# Patient Record
Sex: Female | Born: 1985 | Race: Black or African American | Hispanic: No | Marital: Single | Smoking: Current some day smoker
Health system: Southern US, Community
[De-identification: ages and names within clinical notes are randomized; demographics above are authoritative.]

## PROBLEM LIST (undated history)

## (undated) ENCOUNTER — Inpatient Hospital Stay (HOSPITAL_COMMUNITY): Payer: Self-pay

## (undated) ENCOUNTER — Inpatient Hospital Stay (HOSPITAL_COMMUNITY): Admission: RE | Payer: Medicaid Other | Source: Ambulatory Visit

## (undated) DIAGNOSIS — K819 Cholecystitis, unspecified: Secondary | ICD-10-CM

## (undated) DIAGNOSIS — F419 Anxiety disorder, unspecified: Secondary | ICD-10-CM

## (undated) DIAGNOSIS — D649 Anemia, unspecified: Secondary | ICD-10-CM

## (undated) DIAGNOSIS — Z789 Other specified health status: Secondary | ICD-10-CM

---
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
## 2005-09-14 ENCOUNTER — Inpatient Hospital Stay (HOSPITAL_COMMUNITY): Admission: AD | Admit: 2005-09-14 | Discharge: 2005-09-14 | Payer: Self-pay | Admitting: Family Medicine

## 2006-02-15 ENCOUNTER — Emergency Department (HOSPITAL_COMMUNITY): Admission: EM | Admit: 2006-02-15 | Discharge: 2006-02-16 | Payer: Self-pay | Admitting: Emergency Medicine

## 2006-02-18 ENCOUNTER — Inpatient Hospital Stay (HOSPITAL_COMMUNITY): Admission: AD | Admit: 2006-02-18 | Discharge: 2006-02-18 | Payer: Self-pay | Admitting: Obstetrics & Gynecology

## 2006-03-14 ENCOUNTER — Inpatient Hospital Stay (HOSPITAL_COMMUNITY): Admission: AD | Admit: 2006-03-14 | Discharge: 2006-03-14 | Payer: Self-pay | Admitting: Obstetrics and Gynecology

## 2006-03-28 ENCOUNTER — Inpatient Hospital Stay (HOSPITAL_COMMUNITY): Admission: AD | Admit: 2006-03-28 | Discharge: 2006-03-28 | Payer: Self-pay | Admitting: Obstetrics & Gynecology

## 2006-06-26 ENCOUNTER — Inpatient Hospital Stay (HOSPITAL_COMMUNITY): Admission: AD | Admit: 2006-06-26 | Discharge: 2006-06-26 | Payer: Self-pay | Admitting: Obstetrics

## 2006-07-30 ENCOUNTER — Inpatient Hospital Stay (HOSPITAL_COMMUNITY): Admission: AD | Admit: 2006-07-30 | Discharge: 2006-07-30 | Payer: Self-pay | Admitting: Obstetrics

## 2006-09-17 ENCOUNTER — Inpatient Hospital Stay (HOSPITAL_COMMUNITY): Admission: AD | Admit: 2006-09-17 | Discharge: 2006-09-18 | Payer: Self-pay | Admitting: Family Medicine

## 2006-10-03 ENCOUNTER — Inpatient Hospital Stay (HOSPITAL_COMMUNITY): Admission: AD | Admit: 2006-10-03 | Discharge: 2006-10-05 | Payer: Self-pay | Admitting: Obstetrics

## 2007-01-04 ENCOUNTER — Inpatient Hospital Stay (HOSPITAL_COMMUNITY): Admission: AD | Admit: 2007-01-04 | Discharge: 2007-01-04 | Payer: Self-pay | Admitting: Obstetrics

## 2007-02-04 ENCOUNTER — Inpatient Hospital Stay (HOSPITAL_COMMUNITY): Admission: AD | Admit: 2007-02-04 | Discharge: 2007-02-04 | Payer: Self-pay | Admitting: Obstetrics

## 2007-04-30 ENCOUNTER — Ambulatory Visit (HOSPITAL_COMMUNITY): Admission: RE | Admit: 2007-04-30 | Discharge: 2007-04-30 | Payer: Self-pay | Admitting: Obstetrics

## 2007-05-07 ENCOUNTER — Ambulatory Visit (HOSPITAL_COMMUNITY): Admission: RE | Admit: 2007-05-07 | Discharge: 2007-05-07 | Payer: Self-pay | Admitting: Obstetrics

## 2007-07-12 ENCOUNTER — Encounter (INDEPENDENT_AMBULATORY_CARE_PROVIDER_SITE_OTHER): Payer: Self-pay | Admitting: Obstetrics

## 2007-07-12 ENCOUNTER — Inpatient Hospital Stay (HOSPITAL_COMMUNITY): Admission: AD | Admit: 2007-07-12 | Discharge: 2007-07-15 | Payer: Self-pay | Admitting: Obstetrics

## 2008-03-10 ENCOUNTER — Inpatient Hospital Stay (HOSPITAL_COMMUNITY): Admission: AD | Admit: 2008-03-10 | Discharge: 2008-03-10 | Payer: Self-pay | Admitting: Obstetrics

## 2008-04-10 ENCOUNTER — Inpatient Hospital Stay (HOSPITAL_COMMUNITY): Admission: AD | Admit: 2008-04-10 | Discharge: 2008-04-10 | Payer: Self-pay | Admitting: Gynecology

## 2008-05-26 ENCOUNTER — Inpatient Hospital Stay (HOSPITAL_COMMUNITY): Admission: AD | Admit: 2008-05-26 | Discharge: 2008-05-26 | Payer: Self-pay | Admitting: Obstetrics & Gynecology

## 2008-07-01 ENCOUNTER — Inpatient Hospital Stay (HOSPITAL_COMMUNITY): Admission: AD | Admit: 2008-07-01 | Discharge: 2008-07-03 | Payer: Self-pay | Admitting: Obstetrics

## 2008-09-13 ENCOUNTER — Inpatient Hospital Stay (HOSPITAL_COMMUNITY): Admission: AD | Admit: 2008-09-13 | Discharge: 2008-09-13 | Payer: Self-pay | Admitting: Obstetrics

## 2008-09-15 ENCOUNTER — Inpatient Hospital Stay (HOSPITAL_COMMUNITY): Admission: AD | Admit: 2008-09-15 | Discharge: 2008-09-15 | Payer: Self-pay | Admitting: Obstetrics

## 2008-09-18 ENCOUNTER — Inpatient Hospital Stay (HOSPITAL_COMMUNITY): Admission: AD | Admit: 2008-09-18 | Discharge: 2008-09-22 | Payer: Self-pay | Admitting: Obstetrics

## 2008-09-19 ENCOUNTER — Encounter (INDEPENDENT_AMBULATORY_CARE_PROVIDER_SITE_OTHER): Payer: Self-pay | Admitting: Obstetrics

## 2008-10-08 ENCOUNTER — Emergency Department (HOSPITAL_COMMUNITY): Admission: EM | Admit: 2008-10-08 | Discharge: 2008-10-08 | Payer: Self-pay | Admitting: Emergency Medicine

## 2010-05-27 ENCOUNTER — Emergency Department (HOSPITAL_COMMUNITY): Admission: EM | Admit: 2010-05-27 | Discharge: 2010-05-27 | Payer: Self-pay | Admitting: Emergency Medicine

## 2010-07-18 ENCOUNTER — Emergency Department (HOSPITAL_COMMUNITY): Admission: EM | Admit: 2010-07-18 | Discharge: 2010-07-18 | Payer: Self-pay | Admitting: Emergency Medicine

## 2010-10-07 ENCOUNTER — Encounter: Payer: Self-pay | Admitting: Obstetrics and Gynecology

## 2010-10-08 ENCOUNTER — Encounter: Payer: Self-pay | Admitting: Obstetrics

## 2010-10-26 ENCOUNTER — Emergency Department (HOSPITAL_COMMUNITY)
Admission: EM | Admit: 2010-10-26 | Discharge: 2010-10-27 | Disposition: A | Discharge status: Home / Self Care | Payer: Medicaid Other | Attending: Emergency Medicine | Admitting: Emergency Medicine

## 2010-10-26 DIAGNOSIS — K029 Dental caries, unspecified: Secondary | ICD-10-CM | POA: Insufficient documentation

## 2010-10-26 DIAGNOSIS — K089 Disorder of teeth and supporting structures, unspecified: Secondary | ICD-10-CM | POA: Insufficient documentation

## 2010-11-28 LAB — POCT PREGNANCY, URINE: Preg Test, Ur: NEGATIVE

## 2010-11-30 LAB — URINE MICROSCOPIC-ADD ON

## 2010-11-30 LAB — GLUCOSE, CAPILLARY: Glucose-Capillary: 82 mg/dL (ref 70–99)

## 2010-11-30 LAB — URINALYSIS, ROUTINE W REFLEX MICROSCOPIC
Bilirubin Urine: NEGATIVE
Glucose, UA: NEGATIVE mg/dL
Ketones, ur: NEGATIVE mg/dL
Leukocytes, UA: NEGATIVE
Nitrite: NEGATIVE
Protein, ur: NEGATIVE mg/dL
Specific Gravity, Urine: 1.026 (ref 1.005–1.030)
Urobilinogen, UA: 0.2 mg/dL (ref 0.0–1.0)
pH: 6 (ref 5.0–8.0)

## 2010-11-30 LAB — WET PREP, GENITAL
Trich, Wet Prep: NONE SEEN
Yeast Wet Prep HPF POC: NONE SEEN

## 2010-11-30 LAB — POCT PREGNANCY, URINE: Preg Test, Ur: NEGATIVE

## 2010-11-30 LAB — RPR: RPR Ser Ql: NONREACTIVE

## 2010-11-30 LAB — GC/CHLAMYDIA PROBE AMP, GENITAL
Chlamydia, DNA Probe: NEGATIVE
GC Probe Amp, Genital: NEGATIVE

## 2011-01-01 LAB — CBC
HCT: 28.6 % — ABNORMAL LOW (ref 36.0–46.0)
Hemoglobin: 7.4 g/dL — CL (ref 12.0–15.0)
Hemoglobin: 9.6 g/dL — ABNORMAL LOW (ref 12.0–15.0)
MCHC: 33.6 g/dL (ref 30.0–36.0)
MCHC: 33.7 g/dL (ref 30.0–36.0)
MCV: 95.1 fL (ref 78.0–100.0)
MCV: 96 fL (ref 78.0–100.0)
Platelets: 241 10*3/uL (ref 150–400)
RBC: 2.29 MIL/uL — ABNORMAL LOW (ref 3.87–5.11)
RBC: 3.01 MIL/uL — ABNORMAL LOW (ref 3.87–5.11)
RDW: 13.7 % (ref 11.5–15.5)
WBC: 11 10*3/uL — ABNORMAL HIGH (ref 4.0–10.5)
WBC: 9.9 10*3/uL (ref 4.0–10.5)

## 2011-01-30 NOTE — Op Note (Signed)
NAMELILLIANE, Lauren Pineda               ACCOUNT NO.:  [id number]   MEDICAL RECORD NO.:  [id number]          PATIENT TYPE:  INP   LOCATION:  9320                          [facility]ILITY:  [facility]   PHYSICIAN:  Kathreen Cosier, M.D.DATE OF BIRTH:  1985-11-07   DATE OF PROCEDURE:  07/12/2007  DATE OF DISCHARGE:                               OPERATIVE REPORT   PREOPERATIVE DIAGNOSES:  Intrauterine pregnancy at 33 weeks in labor, 6  cm dilated, with a breech presentation.   SURGEON:  Kathreen Cosier, M.D.   ANESTHESIA:  Spinal.   PROCEDURE:  The patient placed on the operating table in a supine  position after spinal administered.  Abdomen prepped and draped.  Bladder emptied with a Foley catheter.  A transverse suprapubic incision  made, carried down to the fascia, fascia cleaned and incised the length  of the incision.  Recti muscles retracted laterally.  Peritoneum incised  longitudinally.  A transverse incision made in the visceral peritoneum  above the bladder.  Bladder mobilized inferiorly.  Transverse lower  uterine incision made.  The patient delivered from a double footling  breech.  Fluid was clear.  A female, Apgar 8/8, weighing 3 pounds 15  ounces.  The team was in attendance.  Placenta was posterior, removed  manually and sent to pathology.  Uterine cavity cleaned with dry laps.  Uterine incision closed in one layer with continuous suture of #1  chromic.  Hemostasis satisfactory.  Bladder flap reattached with 2-0  chromic.  Uterus well-contracted.  Tubes and ovaries normal.  Abdomen  closed in layers, peritoneum with continuous suture of 0 chromic, fascia  with continuous suture of 0 Dexon, and the skin closed with subcuticular  stitch of 4-0 Monocryl.  Blood loss 500 mL.           ______________________________  Kathreen Cosier, M.D.     BAM/MEDQ  D:  07/12/2007  T:  07/14/2007  Job:  [id number]

## 2011-01-30 NOTE — H&P (Signed)
NAMERAIMI, GUILLERMO NO.:  [id number]   MEDICAL RECORD NO.:  [id number]          PATIENT TYPE:  INP   LOCATION:  9137                          [facility]ILITY:  [facility]   PHYSICIAN:  Kathreen Cosier, M.D.DATE OF BIRTH:  [birth year]   DATE OF ADMISSION:  09/18/2008  DATE OF DISCHARGE:                              HISTORY & PHYSICAL   The patient is a 25 year old gravida 5, para 1-1-2-2, [facility] September 26, 2008.  She had a previous C-section and previous vaginal delivery and  she was followed with this pregnancy and due prenatal because of IUGR.  She had nonstress test twice weekly and ultrasounds every 3 weeks, at  which point the growth had stopped and perinatologist recommended  delivery.  The patient was brought in for induction of labor.  Cervix  was 2 cm, 70%, and the vertex -3 station.  She had a positive GBS and  the membranes were ruptured, IUPC inserted, and started a low dose of  Pitocin.  Shortly after she started contraction, she had variables with  these contraction.  Then eventually variables became prolonged and it  was decided that she will be delivered by C-section because of  nonreassuring fetal heart rate racing.  An amnioinfusion had been done,  but there was no improvement.   PHYSICAL EXAMINATION:  GENERAL:  A well-developed female in labor.  HEENT:  Negative.  LUNGS:  Clear.  HEART:  Regular rhythm.  No murmurs or gallops.  BREASTS:  No masses.  ABDOMEN:  36 weeks size.  PELVIC:  As described above.  EXTREMITIES:  Negative.           ______________________________  Kathreen Cosier, M.D.     BAM/MEDQ  D:  09/19/2008  T:  09/19/2008  Job:  [id number]

## 2011-02-02 NOTE — Discharge Summary (Signed)
Lauren Pineda, Lauren Pineda               ACCOUNT NO.:  [id number]   MEDICAL RECORD NO.:  [id number]          PATIENT TYPE:  INP   LOCATION:  9320                          [facility]   PHYSICIAN:  Kathreen Cosier, M.D.DATE OF BIRTH:  March 03, 1986   DATE OF ADMISSION:  07/12/2007  DATE OF DISCHARGE:  07/15/2007                               DISCHARGE SUMMARY   The patient is to 25 year old, gravida 4, para 1-0-2-1, [facility] August 30, 2007, [redacted] weeks pregnant who came in labor.  Cervix 6 cm dilated with a  breech presentation. She underwent primary low transverse cesarean  section. She had a female, Apgars 8 and 8, weighing 3 pounds 15 ounces,  a double footling breech. Placenta was sent to pathology.   Postoperatively, she did well.  On admission, her hemoglobin was 10.9,  platelet 253; postop 9.8 and 182, respectively.  Hepatitis negative, HIV  negative.  Urine negative, RPR negative. The patient was discharged on  the third postoperative day ambulatory, on a regular diet, and Tylox one  every 3-4 hours for pain and ferrous sulfate 325 p.o. daily.   DISCHARGE DIAGNOSIS:  Status post primary low transverse cesarean  section at 33 weeks for breech presentation in labor.           ______________________________  Kathreen Cosier, M.D.     BAM/MEDQ  D:  08/06/2007  T:  08/06/2007  Job:  [id number]

## 2011-02-02 NOTE — H&P (Signed)
NAME:  Lauren Pineda, Lauren Pineda NO.:  [id number]   MEDICAL RECORD NO.:  [id number]          PATIENT TYPE:  MAT   LOCATION:  MATC                          [facility]:  [facility]   PHYSICIAN:  Juluis Mire, M.D.   DATE OF BIRTH:  1986/05/07   DATE OF ADMISSION:  07/23/2004  DATE OF DISCHARGE:                                HISTORY & PHYSICAL   The patient is an 25 year old, gravida 2, para 0, abortus 1, black female  with estimated gestational age of 10+ weeks.  She has been followed with  first-trimester bleeding.  She had an ultrasound on June 17, 2004 that  revealed an intrauterine gestational sac of 5 weeks.  There was no fetal  pulses.  She did not seek any further followup.  She came in on July 22, 2004 which was yesterday.  An ultrasound revealed a gestational sac at that  point in time with no fetal pole consistent with a non-viable first-  trimester pregnancy.  She presents now to undergo dilatation and evacuation  for non-viable first-trimester pregnancy.   ALLERGIES:  No known drug allergies.   MEDICATIONS:  None.   PAST HISTORY:  She had a Cytotec-aborted miscarriage of 8 weeks.  She has  had no previous surgical history, and the obstetrical history is the noted  miscarriage.  She does have a history of STDs in the past.   FAMILY HISTORY:  Noncontributory.   SOCIAL HISTORY:  Denies alcohol, drug or tobacco use.   REVIEW OF SYSTEMS:  Noncontributory.   PHYSICAL EXAMINATION:  VITAL SIGNS: Patient afebrile with stable vital  signs.  LUNGS: Clear.  CARDIAC SYSTEM: Regular rhythm and rate without murmurs or gallops.  ABDOMEN: Soft, nontender, no mass, organomegaly or tenderness.  PELVIC: Normal external genitalia.  Vaginal mucosa clear.  The cervix is  unremarkable.  There is a minimal amount of bleeding noted.  Uterus  approximately 6-8 weeks in size.  Adnexa unremarkable.  EXTREMITIES: Trace edema.  NEUROLOGIC: Neurologic exam is gross limits.   IMPRESSION:  Nonviable first trimester pregnancy.   PLAN:  The patient will undergo a dilation and evacuation.  The risks of  surgery have been discussed including the risk of infection, the risk of  hemorrhage that could require transfusion, the risk of AIDS or hepatitis,  the risk of  continued bleeding that could require repeat D&E, risk of uterine  perforation that could lead to injury to internal organs requiring  exploratory surgery, the risk of deep venous thrombosis and pulmonary  embolus.  Blood type is O positive.  RhoGAM will not be required.      JSM/MEDQ  D:  07/23/2004  T:  07/23/2004  Job:  [id number]

## 2011-02-02 NOTE — Op Note (Signed)
Lauren Pineda, Lauren Pineda NO.:  [id number]   MEDICAL RECORD NO.:  [id number]          PATIENT TYPE:  MAT   LOCATION:  MATC                          [facility]   PHYSICIAN:  Juluis Mire, M.D.   DATE OF BIRTH:  17-May-1986   DATE OF PROCEDURE:  07/23/2004  DATE OF DISCHARGE:                                 OPERATIVE REPORT   PREOPERATIVE DIAGNOSIS:  Nonviable first trimester pregnancy.   POSTOPERATIVE DIAGNOSIS:  Nonviable first trimester pregnancy.   OPERATIVE PROCEDURE:  Dilatation evacuation along with paracervical block.   SURGEON:  Juluis Mire, M.D.   ANESTHESIA:  Sedation with paracervical block.   ESTIMATED BLOOD LOSS:  Minimal.   PACKS AND DRAINS:  None.   INTRAOPERATIVE BLOOD REPLACEMENT:  None.   COMPLICATIONS:  None.   INDICATIONS:  As dictated in history and physical.   PROCEDURE AS FOLLOWS:  The patient was taken to the OR and placed in the  supine position.  After sedation, was placed in dorsal lithotomy position  using the Allen stirrups.  Perineum and vagina are cleansed out with  Betadine.  Speculum was placed in the vaginal vault.  Paracervical block is  instituted using 1% Nesacaine.  The cervix was grasped with a single-tooth  tenaculum and cervix serially dilated to a size 27 Pratt dilator.  The size  8 curved suction curette was introduced.  Intrauterine contents were removed  using suction curetting.  This was continued until no additional tissue was  obtained.  Sharp curetting revealed all four quadrants to be clear.  Repeat  suction revealed no additional tissue.  No signs of perforation.  The uterus  was contracting down well with minimal bleeding.  Did have an anterior  cervical tear from the tenaculum that was reapproximated with interrupted  figure-of-eights of 2-0 chromic.  Speculum and single-tooth tenaculum  removed.  The patient taken out of the dorsal lithotomy position and once  alert, transferred to the recovery  room in good condition.  Sponge, needle,  and instrument reported as correct by circulating nurse.      JSM/MEDQ  D:  07/23/2004  T:  07/23/2004  Job:  [id number]

## 2011-02-02 NOTE — Discharge Summary (Signed)
Lauren Pineda, Lauren Pineda               ACCOUNT NO.:  [id number]   MEDICAL RECORD NO.:  [id number]          PATIENT TYPE:  INP   LOCATION:  9137                          [facility]   PHYSICIAN:  Kathreen Cosier, M.D.DATE OF BIRTH:  27-May-1986   DATE OF ADMISSION:  09/18/2008  DATE OF DISCHARGE:  09/22/2008                               DISCHARGE SUMMARY   The patient is a 25 year old gravida 5, para 1-1-2-2, [facility] on September 28, 2008. She does have IUGR.  She was followed at [facility] with  nonstress tests and ultrasounds, fetus stopped growing, and the  perinatologist recommended induction of labor.  She had a C-section x1  for breech.  On admission, she was 2 cm, GBS was negative, and  eventually she developed prolonged decelerations and had a repeat C-  section and had a female, Apgar 8 and 9, weighing 5 pounds 4 ounces.  Placenta was sent to Pathology.  Postoperatively, the patient did well.  She was discharged on the third postoperative day, ambulatory, on a  regular diet, to see me in 6 weeks.   DISCHARGE MEDICATIONS:  Tylox and ferrous sulfate for anemia.           ______________________________  Kathreen Cosier, M.D.     BAM/MEDQ  D:  10/13/2008  T:  10/13/2008  Job:  [id number]

## 2011-02-02 NOTE — Op Note (Signed)
[name], Lauren Pineda               ACCOUNT NO.:  [id number]   MEDICAL RECORD NO.:  [id number]          PATIENT TYPE:  INP   LOCATION:  9137                          [facility]   PHYSICIAN:  Kathreen Cosier, M.D.DATE OF BIRTH:  December 30, 1985   DATE OF PROCEDURE:  09/20/2007  DATE OF DISCHARGE:                               OPERATIVE REPORT   PREOPERATIVE DIAGNOSES:  Previous cesarean section at term, intrauterine  growth restriction, nonreassuring fetal heart rate tracing.   POSTOPERATIVE DIAGNOSES:  Previous cesarean section at term,  intrauterine growth restriction, nonreassuring fetal heart rate tracing.   SURGEON:  Kathreen Cosier, MD   ANESTHESIA:  Epidural.   PROCEDURE IN DETAIL:  The patient was placed in the operating table in  supine position.  Abdomen prepped and draped, bladder emptied with a  Foley catheter.  Transverse suprapubic incision made through old scar,  carried down to rectus fascia.  Fascia cleaned and incised length of the  incision.  Recti muscles were retracted laterally.  Peritoneum was  incised longitudinally.  Transverse incision was made in the visceral  peritoneum above the bladder.  Bladder mobilized inferiorly.  Transverse  lower uterine incision was made.  The patient delivered from the LOA  position of a female, Apgars 8 and 9, weighing 5 pounds 4 ounces.  The  team was in attendance.  Placenta was posterior, removed manually, and  sent to Pathology.  Uterine cavity was cleaned with dry laps.  Uterine  incision closed in one layer with continuous suture of #1 chromic.  Hemostasis was satisfactory.  Bladder flap reattached with 2-0 chromic.  Uterus was well contracted.  Tubes and ovaries were normal.  Abdomen was  closed in layers, peritoneum continuous of 0 chromic, fascia continuous  suture with Dexon, and the skin closed with subcuticular stitch of 4-0  Monocryl.  Blood loss 300 mL.  The patient tolerated the procedure well  and taken to  recovery room in good condition.           ______________________________  Kathreen Cosier, M.D.     BAM/MEDQ  D:  09/19/2008  T:  09/19/2008  Job:  [id number]

## 2011-06-14 LAB — URINALYSIS, ROUTINE W REFLEX MICROSCOPIC
Glucose, UA: NEGATIVE
Hgb urine dipstick: NEGATIVE
pH: 5.5

## 2011-06-14 LAB — POCT PREGNANCY, URINE: Preg Test, Ur: POSITIVE

## 2011-06-15 LAB — WET PREP, GENITAL: Yeast Wet Prep HPF POC: NONE SEEN

## 2011-06-15 LAB — GC/CHLAMYDIA PROBE AMP, GENITAL
Chlamydia, DNA Probe: NEGATIVE
GC Probe Amp, Genital: NEGATIVE

## 2011-06-18 LAB — URINALYSIS, ROUTINE W REFLEX MICROSCOPIC
Glucose, UA: NEGATIVE
Ketones, ur: 15 — AB
Specific Gravity, Urine: 1.025
pH: 6

## 2011-06-18 LAB — URINE MICROSCOPIC-ADD ON

## 2011-06-22 LAB — RPR: RPR Ser Ql: NONREACTIVE

## 2011-06-22 LAB — CBC
MCV: 95.6 fL (ref 78.0–100.0)
Platelets: 244 10*3/uL (ref 150–400)
WBC: 11.2 10*3/uL — ABNORMAL HIGH (ref 4.0–10.5)

## 2011-06-26 ENCOUNTER — Emergency Department (HOSPITAL_COMMUNITY)
Admission: EM | Admit: 2011-06-26 | Discharge: 2011-06-27 | Disposition: A | Discharge status: Home / Self Care | Payer: Medicaid Other | Attending: Emergency Medicine | Admitting: Emergency Medicine

## 2011-06-26 DIAGNOSIS — M545 Low back pain, unspecified: Secondary | ICD-10-CM | POA: Insufficient documentation

## 2011-06-26 DIAGNOSIS — M533 Sacrococcygeal disorders, not elsewhere classified: Secondary | ICD-10-CM | POA: Insufficient documentation

## 2011-06-26 DIAGNOSIS — B9689 Other specified bacterial agents as the cause of diseases classified elsewhere: Secondary | ICD-10-CM | POA: Insufficient documentation

## 2011-06-26 DIAGNOSIS — A499 Bacterial infection, unspecified: Secondary | ICD-10-CM | POA: Insufficient documentation

## 2011-06-26 DIAGNOSIS — N760 Acute vaginitis: Secondary | ICD-10-CM | POA: Insufficient documentation

## 2011-06-26 LAB — URINALYSIS, ROUTINE W REFLEX MICROSCOPIC
Nitrite: NEGATIVE
Protein, ur: NEGATIVE mg/dL
Urobilinogen, UA: 0.2 mg/dL (ref 0.0–1.0)

## 2011-06-26 LAB — URINE MICROSCOPIC-ADD ON

## 2011-06-26 LAB — POCT PREGNANCY, URINE: Preg Test, Ur: NEGATIVE

## 2011-06-27 LAB — RPR: RPR Ser Ql: NONREACTIVE

## 2011-06-27 LAB — URINALYSIS, ROUTINE W REFLEX MICROSCOPIC
Glucose, UA: NEGATIVE
Hgb urine dipstick: NEGATIVE
Specific Gravity, Urine: >1.03 — ABNORMAL HIGH

## 2011-06-27 LAB — CBC
HCT: 29.2 — ABNORMAL LOW
HCT: 32 — ABNORMAL LOW
HCT: 36.3 % (ref 36.0–46.0)
Hemoglobin: 12 g/dL (ref 12.0–15.0)
Hemoglobin: 9.8 — ABNORMAL LOW
MCHC: 33.5
MCV: 94.7
MCV: 96
Platelets: 182
RBC: 3.34 — ABNORMAL LOW
RDW: 13
WBC: 8.2 10*3/uL (ref 4.0–10.5)
WBC: 9.8

## 2011-06-27 LAB — POCT I-STAT, CHEM 8
Chloride: 103 mEq/L (ref 96–112)
Creatinine, Ser: 0.8 mg/dL (ref 0.50–1.10)
Glucose, Bld: 112 mg/dL — ABNORMAL HIGH (ref 70–99)
Hemoglobin: 13.3 g/dL (ref 12.0–15.0)
Potassium: 3.5 mEq/L (ref 3.5–5.1)

## 2011-06-27 LAB — DIFFERENTIAL
Basophils Absolute: 0 10*3/uL (ref 0.0–0.1)
Lymphocytes Relative: 43 % (ref 12–46)
Neutro Abs: 4 10*3/uL (ref 1.7–7.7)

## 2011-06-27 LAB — ABO/RH: ABO/RH(D): O POS

## 2011-06-27 LAB — RUBELLA SCREEN: Rubella: >500 — ABNORMAL HIGH

## 2011-06-27 LAB — WET PREP, GENITAL: WBC, Wet Prep HPF POC: NONE SEEN

## 2012-02-15 DIAGNOSIS — N898 Other specified noninflammatory disorders of vagina: Secondary | ICD-10-CM | POA: Insufficient documentation

## 2012-02-15 DIAGNOSIS — F17200 Nicotine dependence, unspecified, uncomplicated: Secondary | ICD-10-CM | POA: Insufficient documentation

## 2012-02-16 ENCOUNTER — Encounter (HOSPITAL_COMMUNITY): Payer: Self-pay | Admitting: Emergency Medicine

## 2012-02-16 ENCOUNTER — Emergency Department (HOSPITAL_COMMUNITY)
Admission: EM | Admit: 2012-02-16 | Discharge: 2012-02-16 | Disposition: A | Discharge status: Home / Self Care | Payer: Self-pay | Attending: Emergency Medicine | Admitting: Emergency Medicine

## 2012-02-16 DIAGNOSIS — N898 Other specified noninflammatory disorders of vagina: Secondary | ICD-10-CM

## 2012-02-16 LAB — URINALYSIS, ROUTINE W REFLEX MICROSCOPIC
Bilirubin Urine: NEGATIVE
Glucose, UA: NEGATIVE mg/dL
Ketones, ur: NEGATIVE mg/dL
pH: 6 (ref 5.0–8.0)

## 2012-02-16 LAB — WET PREP, GENITAL
Clue Cells Wet Prep HPF POC: NONE SEEN
Trich, Wet Prep: NONE SEEN
Yeast Wet Prep HPF POC: NONE SEEN

## 2012-02-16 LAB — URINE MICROSCOPIC-ADD ON

## 2012-02-16 LAB — POCT PREGNANCY, URINE: Preg Test, Ur: NEGATIVE

## 2012-02-16 MED ORDER — CEFTRIAXONE SODIUM 250 MG IJ SOLR
250.0000 mg | Freq: Once | INTRAMUSCULAR | Status: AC
  Administered 2012-02-16: 250 mg via INTRAMUSCULAR
  Filled 2012-02-16: qty 250

## 2012-02-16 MED ORDER — AZITHROMYCIN 250 MG PO TABS
1000.0000 mg | ORAL_TABLET | Freq: Once | ORAL | Status: AC
  Administered 2012-02-16: 1000 mg via ORAL
  Filled 2012-02-16: qty 4

## 2012-02-16 MED ORDER — LIDOCAINE HCL (PF) 1 % IJ SOLN
INTRAMUSCULAR | Status: AC
  Administered 2012-02-16: 2 mL
  Filled 2012-02-16: qty 5

## 2012-02-16 NOTE — ED Notes (Signed)
PT. REPORTS VAGINAL DISCHARGE WITH IRRITATION / ITCHING " YEAST" FOR SEVERAL DAYS .

## 2012-02-16 NOTE — Discharge Instructions (Signed)
It is very important establish care with a gynecologist in the near future for further evaluation and management of recurrent vaginal discharge. You may establish care with the Health Department or the [facility]. Return to the emergency department for any emergent changing or worsening symptoms.

## 2012-02-16 NOTE — ED Provider Notes (Signed)
Medical screening examination/treatment/procedure(s) were performed by non-physician practitioner and as supervising physician I was immediately available for consultation/collaboration.   Joya Gaskins, MD 02/16/12 [phone]

## 2012-02-16 NOTE — ED Notes (Signed)
Pelvic cart set up at bedside  

## 2012-02-16 NOTE — ED Provider Notes (Signed)
History     CSN: [id number]  Arrival date & time 02/15/12  2352   First MD Initiated Contact with Patient 02/16/12 0054      Chief Complaint  Patient presents with  . Vaginal Discharge    (Consider location/radiation/quality/duration/timing/severity/associated sxs/prior treatment) Patient is a 26 y.o. female presenting with vaginal discharge. The history is provided by the patient.  Vaginal Discharge    Patient who is Lauren Pineda with LMP on May 15th which she states was a normal period presents to ER complaining of a 6 month hx of intermittent pink tinged mucus cervical d/c from that she states will recur every month about 2 weeks after completing her period and will last about 3 days and then resolve before it recurs. She denies fevers, chills, abdominal pain, n/v/d, dysuria, hematuria or blood in stool. patient states she was seen at health department 3 months ago and was told "nothing is wrong." she states she had unknown STD about 6 months ago and was treated for that. Denies dyspareunia.   History reviewed. No pertinent past medical history.  Past Surgical History  Procedure Date  . Cesarean section     No family history on file.  History  Substance Use Topics  . Smoking status: Current Everyday Smoker  . Smokeless tobacco: Not on file  . Alcohol Use: Yes    OB History    Grav Para Term Preterm Abortions TAB SAB Ect Mult Living                  Review of Systems  Genitourinary: Positive for vaginal discharge.  All other systems reviewed and are negative.    Allergies  Latex  Home Medications  No current outpatient prescriptions on file.  BP 122/90  Pulse 79  Temp 98.4 F (36.9 C)  Resp 18  SpO2 99%  LMP 01/23/2012  Physical Exam  Nursing note and vitals reviewed. Constitutional: She is oriented to person, place, and time. She appears well-developed and well-nourished. No distress.  HENT:  Head: Normocephalic and atraumatic.  Eyes: Conjunctivae  are normal.  Neck: Normal range of motion. Neck supple.  Cardiovascular: Normal rate, regular rhythm, normal heart sounds and intact distal pulses.  Exam reveals no gallop and no friction rub.   No murmur heard. Pulmonary/Chest: Effort normal and breath sounds normal. No respiratory distress. She has no wheezes. She has no rales. She exhibits no tenderness.  Abdominal: Bowel sounds are normal. She exhibits no distension and no mass. There is no tenderness. There is no rebound and no guarding.  Genitourinary: Vagina normal and uterus normal. There is no tenderness, lesion or injury on the right labia. There is no tenderness, lesion or injury on the left labia. Cervix exhibits discharge. Cervix exhibits no motion tenderness and no friability. Right adnexum displays no mass, no tenderness and no fullness. Left adnexum displays no mass, no tenderness and no fullness.       Pink tinged mucus d/c from cervix  Musculoskeletal: Normal range of motion. She exhibits no edema and no tenderness.  Neurological: She is alert and oriented to person, place, and time.  Skin: Skin is warm and dry. No rash noted. She is not diaphoretic. No erythema.  Psychiatric: She has a normal mood and affect.    ED Course  Procedures (including critical care time)  PO zithromax and IM rocephin.  Patient requests to be treated for STD.   Labs Reviewed  URINALYSIS, ROUTINE W REFLEX MICROSCOPIC - Abnormal; Notable for the  following:    APPearance CLOUDY (*)    Hgb urine dipstick LARGE (*)    Leukocytes, UA MODERATE (*)    All other components within normal limits  URINE MICROSCOPIC-ADD ON - Abnormal; Notable for the following:    Squamous Epithelial / LPF MANY (*)    All other components within normal limits  WET PREP, GENITAL  POCT PREGNANCY, URINE  GC/CHLAMYDIA PROBE AMP, GENITAL  URINE CULTURE   No results found.   1. Vaginal Discharge       MDM  No CMT or adnexal TTP. Abdomen soft and non tender.  Afebrile. Pink tinged vaginal d/c that was prophylactically treated for STD. [facility] Denies additional complaint.         [place], [place] 02/16/12 [phone]

## 2012-02-16 NOTE — ED Notes (Signed)
Pt states that he has been having discharge for every month for 3 days at the same time of month. Pt periods are irregular but the discharge remains consistant. Pt states discharge pinkish in color, no odor.

## 2012-02-17 LAB — URINE CULTURE: Culture  Setup Time: 201306011151

## 2012-02-20 NOTE — ED Notes (Addendum)
+   Chlamydia Patient treated with rocephin and zithromax-[employer]HS letter faxed.  + Urine culture-order for Amoxicillin 500 mg QID x 7 days  Written by Fayrene Helper need to be called to pharmacy

## 2012-02-23 NOTE — ED Notes (Signed)
Sent patient letter after no answer x3.

## 2012-04-16 ENCOUNTER — Encounter (HOSPITAL_COMMUNITY): Payer: Self-pay | Admitting: *Deleted

## 2012-04-16 ENCOUNTER — Inpatient Hospital Stay (HOSPITAL_COMMUNITY)
Admission: AD | Admit: 2012-04-16 | Discharge: 2012-04-17 | Disposition: A | Discharge status: Home / Self Care | Payer: Medicaid Other | Source: Ambulatory Visit | Attending: Obstetrics and Gynecology | Admitting: Obstetrics and Gynecology

## 2012-04-16 ENCOUNTER — Inpatient Hospital Stay (HOSPITAL_COMMUNITY): Payer: Medicaid Other

## 2012-04-16 DIAGNOSIS — R109 Unspecified abdominal pain: Secondary | ICD-10-CM | POA: Insufficient documentation

## 2012-04-16 DIAGNOSIS — O219 Vomiting of pregnancy, unspecified: Secondary | ICD-10-CM

## 2012-04-16 DIAGNOSIS — R51 Headache: Secondary | ICD-10-CM | POA: Insufficient documentation

## 2012-04-16 DIAGNOSIS — R42 Dizziness and giddiness: Secondary | ICD-10-CM | POA: Insufficient documentation

## 2012-04-16 DIAGNOSIS — Z3201 Encounter for pregnancy test, result positive: Secondary | ICD-10-CM | POA: Insufficient documentation

## 2012-04-16 DIAGNOSIS — Z3490 Encounter for supervision of normal pregnancy, unspecified, unspecified trimester: Secondary | ICD-10-CM

## 2012-04-16 DIAGNOSIS — Z1389 Encounter for screening for other disorder: Secondary | ICD-10-CM

## 2012-04-16 LAB — WET PREP, GENITAL
Clue Cells Wet Prep HPF POC: NONE SEEN
Trich, Wet Prep: NONE SEEN
Yeast Wet Prep HPF POC: NONE SEEN

## 2012-04-16 LAB — CBC
Hemoglobin: 12 g/dL (ref 12.0–15.0)
MCH: 30.8 pg (ref 26.0–34.0)
MCV: 92.1 fL (ref 78.0–100.0)
RBC: 3.9 MIL/uL (ref 3.87–5.11)

## 2012-04-16 LAB — URINALYSIS, ROUTINE W REFLEX MICROSCOPIC
Bilirubin Urine: NEGATIVE
Glucose, UA: NEGATIVE mg/dL
Ketones, ur: 15 mg/dL — AB
pH: 8 (ref 5.0–8.0)

## 2012-04-16 MED ORDER — ONDANSETRON 8 MG PO TBDP
8.0000 mg | ORAL_TABLET | ORAL | Status: AC
  Administered 2012-04-16: 8 mg via ORAL
  Filled 2012-04-16: qty 1

## 2012-04-16 MED ORDER — ACETAMINOPHEN 500 MG PO TABS
1000.0000 mg | ORAL_TABLET | ORAL | Status: AC
  Administered 2012-04-16: 1000 mg via ORAL
  Filled 2012-04-16: qty 2

## 2012-04-16 MED ORDER — PROMETHAZINE HCL 12.5 MG PO TABS: 12.5000 mg | ORAL_TABLET | Freq: Four times a day (QID) | ORAL | Status: DC | PRN

## 2012-04-16 NOTE — MAU Provider Note (Signed)
Lauren Pineda 60 @[redacted]w[redacted]d  by LMP Chief Complaint  Patient presents with  . Abdominal Cramping     None     SUBJECTIVE  HPI: Pt presents to MAU with n/v and abdominal cramping x4 days.  She also reports dizziness and headache, and indicates she has not kept down any fluids today.  Patient's last menstrual period was 03/12/2012.  She denies vaginal bleeding, vaginal itching/burning, urinary symptoms, or fever/chills.    No past medical history on file. Past Surgical History  Procedure Date  . Cesarean section    History   Social History  . Marital Status: Single    Spouse Name: N/A    Number of Children: N/A  . Years of Education: N/A   Occupational History  . Not on file.   Social History Main Topics  . Smoking status: Current Everyday Smoker  . Smokeless tobacco: Not on file  . Alcohol Use: Yes  . Drug Use: No  . Sexually Active:    Other Topics Concern  . Not on file   Social History Narrative  . No narrative on file   No current facility-administered medications on file prior to encounter.   No current outpatient prescriptions on file prior to encounter.   Allergies  Allergen Reactions  . Latex Itching    ROS: Pertinent items in HPI  OBJECTIVE Blood pressure 122/81, pulse 103, resp. rate 18, height 5\' 4"  (1.626 m), weight 81.647 kg (180 lb), last menstrual period 03/12/2012, SpO2 100.00%.  GENERAL: Well-developed, well-nourished female in no acute distress.  HEENT: Normocephalic, good dentition HEART: normal rate RESP: normal effort ABDOMEN: Soft, nontender EXTREMITIES: Nontender, no edema NEURO: Alert and oriented Pelvic exam: Cervix pink, visually closed, without lesion, scant white creamy discharge, vaginal walls and external genitalia normal Bimanual exam: Cervix 0/long/high, firm, anterior, neg CMT, uterus mildly tender, nonenlarged, adnexa without tenderness, enlargement, or mass   LAB RESULTS  Results for orders placed during the  hospital encounter of 04/16/12 (from the past 24 hour(s))  URINALYSIS, ROUTINE W REFLEX MICROSCOPIC     Status: Abnormal   Collection Time   04/16/12  7:25 PM      Component Value Range   Color, Urine YELLOW  YELLOW   APPearance CLEAR  CLEAR   Specific Gravity, Urine 1.020  1.005 - 1.030   pH 8.0  5.0 - 8.0   Glucose, UA NEGATIVE  NEGATIVE mg/dL   Hgb urine dipstick NEGATIVE  NEGATIVE   Bilirubin Urine NEGATIVE  NEGATIVE   Ketones, ur 15 (*) NEGATIVE mg/dL   Protein, ur NEGATIVE  NEGATIVE mg/dL   Urobilinogen, UA 0.2  0.0 - 1.0 mg/dL   Nitrite NEGATIVE  NEGATIVE   Leukocytes, UA NEGATIVE  NEGATIVE  POCT PREGNANCY, URINE     Status: Abnormal   Collection Time   04/16/12  8:02 PM      Component Value Range   Preg Test, Ur POSITIVE (*) NEGATIVE    IMAGING   ASSESSMENT Positive urine pregnancy test N/V of pregnancy  PLAN Zofran 8 mg ODT and Tylenol 1000 ml x1 dose in MAU D/C home GCC pending Phenergan 12.5 mg PO Q6 hours PRN nausea F/U with early prenatal care Pregnancy verification letter provided Return to MAU as needed  Medication List  As of 04/16/2012  8:56 PM   ASK your doctor about these medications         acetaminophen 325 MG tablet   Commonly known as: TYLENOL   Take 650 mg by  mouth every 6 (six) hours as needed. headache            LEFTWICH-KIRBY, LISA 04/16/2012 8:56 PM

## 2012-04-16 NOTE — MAU Note (Signed)
Pt presents with cramping x 4 days.  LMP on 03/12/12 with + pregnancy test today.  Pregnancy test was negative on 03/16/12.

## 2012-04-16 NOTE — MAU Note (Signed)
Pt reports "the most cramping that feels so bad", pt states symptoms x 4 days. LMP 03/12/2012. Positive preg test at home today. Nauseated at times.

## 2012-04-21 NOTE — MAU Provider Note (Signed)
Attestation of Attending Supervision of Advanced Practitioner: Evaluation and management procedures were performed by the PA/NP/CNM/OB Fellow under my supervision/collaboration. Chart reviewed and agree with management and plan.  Ridley Schewe V 04/21/2012 6:55 AM    

## 2012-06-21 ENCOUNTER — Inpatient Hospital Stay (HOSPITAL_COMMUNITY)
Admission: AD | Admit: 2012-06-21 | Discharge: 2012-06-21 | Disposition: A | Discharge status: Home / Self Care | Payer: Medicaid Other | Source: Ambulatory Visit | Attending: Obstetrics & Gynecology | Admitting: Obstetrics & Gynecology

## 2012-06-21 ENCOUNTER — Encounter (HOSPITAL_COMMUNITY): Payer: Self-pay | Admitting: *Deleted

## 2012-06-21 DIAGNOSIS — O99891 Other specified diseases and conditions complicating pregnancy: Secondary | ICD-10-CM | POA: Insufficient documentation

## 2012-06-21 DIAGNOSIS — R109 Unspecified abdominal pain: Secondary | ICD-10-CM | POA: Insufficient documentation

## 2012-06-21 DIAGNOSIS — N390 Urinary tract infection, site not specified: Secondary | ICD-10-CM

## 2012-06-21 LAB — URINE MICROSCOPIC-ADD ON

## 2012-06-21 LAB — URINALYSIS, ROUTINE W REFLEX MICROSCOPIC
Bilirubin Urine: NEGATIVE
Glucose, UA: NEGATIVE mg/dL
Specific Gravity, Urine: 1.015 (ref 1.005–1.030)

## 2012-06-21 MED ORDER — CEPHALEXIN 500 MG PO CAPS: 500.0000 mg | ORAL_CAPSULE | Freq: Four times a day (QID) | ORAL | Status: DC

## 2012-06-21 MED ORDER — PROMETHAZINE HCL 25 MG PO TABS: 25.0000 mg | ORAL_TABLET | Freq: Four times a day (QID) | ORAL | Status: DC | PRN

## 2012-06-21 MED ORDER — IBUPROFEN 600 MG PO TABS
600.0000 mg | ORAL_TABLET | Freq: Once | ORAL | Status: AC
  Administered 2012-06-21: 600 mg via ORAL
  Filled 2012-06-21: qty 1

## 2012-06-21 NOTE — MAU Provider Note (Signed)
History     CSN: [id number]  Arrival date and time: 06/21/12 2001   First Provider Initiated Contact with Patient 06/21/12 2020      Chief Complaint  Patient presents with  . Abdominal Pain   HPI This is a 26 y.o. female at [redacted]w[redacted]d who presents with complaints of severe lower abdominal pain since earlier today. Has not taken any med for this. Denies bleeding. Has nausea/vomiting since early pregnancy, did not get Rx filled. Denies constipation or diarrhea. Denies dysuria.  OB History    Grav Para Term Preterm Abortions TAB SAB Ect Mult Living   6 3 1 2 2  2   3       History reviewed. No pertinent past medical history.  Past Surgical History  Procedure Date  . Cesarean section     History reviewed. No pertinent family history.  History  Substance Use Topics  . Smoking status: Current Every Day Smoker  . Smokeless tobacco: Not on file  . Alcohol Use: Yes    Allergies:  Allergies  Allergen Reactions  . Latex Itching    No prescriptions prior to admission    ROS  See HPI  Physical Exam   Blood pressure 112/67, pulse 85, temperature 98.8 F (37.1 C), temperature source Oral, resp. rate 20, height 5\' 4"  (1.626 m), weight 172 lb 4 oz (78.132 kg), last menstrual period 03/09/2012.  Physical Exam  Constitutional: She is oriented to person, place, and time. She appears well-developed and well-nourished. No distress.  Cardiovascular: Normal rate.   Respiratory: Effort normal.  GI: Soft. She exhibits no distension and no mass. There is tenderness (slight, lower abdomen). There is no rebound and no guarding.  Genitourinary: Vagina normal and uterus normal. No vaginal discharge found.       Long/closed   Musculoskeletal: Normal range of motion.  Neurological: She is alert and oriented to person, place, and time.  Skin: Skin is warm and dry.  Psychiatric: Her behavior is normal. Judgment and thought content normal.       Mood - depressed   States has not sought  evaluation/help for depression, does not want to right now.  After more discussion, it seems that she is concerned about paternity. Had IC with two different men 4 days apart. One is the father of her other children, who she is with now. Other was a one time thing.  She is worried about him being the father. Asks about abortion. Does not want to consider adoption. May want paternity testing, but doesn't want either man to know she is doing it.   FHR 150s per doppler Fundal height c/w 15 weeks  MAU Course  Procedures  MDM Will try dose of ibuprofen and wait for UA >>  Results for orders placed during the hospital encounter of 06/21/12 (from the past 24 hour(s))  URINALYSIS, ROUTINE W REFLEX MICROSCOPIC     Status: Abnormal   Collection Time   06/21/12  8:07 PM      Component Value Range   Color, Urine YELLOW  YELLOW   APPearance TURBID (*) CLEAR   Specific Gravity, Urine 1.015  1.005 - 1.030   pH 6.5  5.0 - 8.0   Glucose, UA NEGATIVE  NEGATIVE mg/dL   Hgb urine dipstick TRACE (*) NEGATIVE   Bilirubin Urine NEGATIVE  NEGATIVE   Ketones, ur NEGATIVE  NEGATIVE mg/dL   Protein, ur NEGATIVE  NEGATIVE mg/dL   Urobilinogen, UA 1.0  0.0 - 1.0 mg/dL  Nitrite NEGATIVE  NEGATIVE   Leukocytes, UA SMALL (*) NEGATIVE  URINE MICROSCOPIC-ADD ON     Status: Abnormal   Collection Time   06/21/12  8:07 PM      Component Value Range   Squamous Epithelial / LPF MANY (*) RARE   WBC, UA 3-6  <3 WBC/hpf   RBC / HPF 0-2  <3 RBC/hpf   Bacteria, UA MANY (*) RARE   Urine-Other MUCOUS PRESENT       Assessment and Plan  A:  SIUP at [redacted]w[redacted]d      Possible UTI      Paternity concerns/stress  P:  Discharge home      Long discussion of issues above      Rx's given for Phenergan and Keflex.  Advised [facility] has $4 list      Encouraged to seek [facility]        [facility] 06/21/2012, 8:31 PM

## 2012-06-21 NOTE — MAU Note (Signed)
Really bad pain in the lower abdomen.

## 2012-06-22 NOTE — MAU Provider Note (Signed)
Attestation of Attending Supervision of Advanced Practitioner (CNM/NP): Evaluation and management procedures were performed by the Advanced Practitioner under my supervision and collaboration.  I have reviewed the Advanced Practitioner's note and chart, and I agree with the management and plan.  HARRAWAY-SMITH, Jedrick Hutcherson 8:31 AM     

## 2012-06-24 ENCOUNTER — Encounter (HOSPITAL_COMMUNITY): Payer: Self-pay | Admitting: Emergency Medicine

## 2012-06-24 ENCOUNTER — Emergency Department (HOSPITAL_COMMUNITY): Payer: Medicaid Other

## 2012-06-24 ENCOUNTER — Emergency Department (HOSPITAL_COMMUNITY)
Admission: EM | Admit: 2012-06-24 | Discharge: 2012-06-24 | Disposition: A | Discharge status: Home / Self Care | Payer: Medicaid Other | Attending: Emergency Medicine | Admitting: Emergency Medicine

## 2012-06-24 DIAGNOSIS — Z3490 Encounter for supervision of normal pregnancy, unspecified, unspecified trimester: Secondary | ICD-10-CM

## 2012-06-24 DIAGNOSIS — R109 Unspecified abdominal pain: Secondary | ICD-10-CM | POA: Insufficient documentation

## 2012-06-24 DIAGNOSIS — Z331 Pregnant state, incidental: Secondary | ICD-10-CM | POA: Insufficient documentation

## 2012-06-24 DIAGNOSIS — O2390 Unspecified genitourinary tract infection in pregnancy, unspecified trimester: Secondary | ICD-10-CM | POA: Insufficient documentation

## 2012-06-24 DIAGNOSIS — N760 Acute vaginitis: Secondary | ICD-10-CM | POA: Insufficient documentation

## 2012-06-24 DIAGNOSIS — A499 Bacterial infection, unspecified: Secondary | ICD-10-CM | POA: Insufficient documentation

## 2012-06-24 DIAGNOSIS — B9689 Other specified bacterial agents as the cause of diseases classified elsewhere: Secondary | ICD-10-CM

## 2012-06-24 LAB — URINALYSIS, ROUTINE W REFLEX MICROSCOPIC
Bilirubin Urine: NEGATIVE
Ketones, ur: 15 mg/dL — AB
Nitrite: NEGATIVE
Protein, ur: NEGATIVE mg/dL
Urobilinogen, UA: 1 mg/dL (ref 0.0–1.0)

## 2012-06-24 LAB — URINE MICROSCOPIC-ADD ON

## 2012-06-24 LAB — WET PREP, GENITAL
Trich, Wet Prep: NONE SEEN
Yeast Wet Prep HPF POC: NONE SEEN

## 2012-06-24 MED ORDER — METRONIDAZOLE 500 MG PO TABS: 500.0000 mg | ORAL_TABLET | Freq: Two times a day (BID) | ORAL | Status: DC

## 2012-06-24 NOTE — ED Provider Notes (Signed)
History     CSN: [id number]  Arrival date & time 06/24/12  1537   First MD Initiated Contact with Patient 06/24/12 1809      Chief Complaint  Patient presents with  . Back Pain  . Abdominal Cramping    (Consider location/radiation/quality/duration/timing/severity/associated sxs/prior treatment) HPI Comments: 26 year old female presents emergency department complaining of sudden onset low back pain since Friday. She cannot recall what she was doing when the back pain began. The pain as constant, rated 10 out of 10. She has not tried any alleviating factors. Admits to associated intermittent lower abdominal pain beginning this morning. Pain worse on the right. She is around [redacted] weeks pregnant, however she is not exactly sure how far along she is. She's had no prenatal care. Her last menstrual period was June 23, and she was at [facility] in July for a urinary tract infection, when she was told she was about [redacted] weeks pregnant. Admits to having nausea and vomiting for the past 4 months. No change in appetite. Denies vaginal bleeding, odor or, itching or discharge. This is her fourth pregnancy. Denies chest pain, shortness of breath, dysuria, hematuria, lightheadedness or headaches.  The history is provided by the patient.    History reviewed. No pertinent past medical history.  Past Surgical History  Procedure Date  . Cesarean section     History reviewed. No pertinent family history.  History  Substance Use Topics  . Smoking status: Current Every Day Smoker  . Smokeless tobacco: Not on file  . Alcohol Use: No    OB History    Grav Para Term Preterm Abortions TAB SAB Ect Mult Living   6 3 1 2 2  2   3       Review of Systems  Constitutional: Negative for fever, activity change, appetite change and fatigue.  HENT: Negative for neck pain and neck stiffness.   Respiratory: Negative for shortness of breath.   Cardiovascular: Negative for chest pain.  Gastrointestinal:  Positive for nausea, vomiting and abdominal pain.  Genitourinary: Negative for dysuria, urgency, hematuria, vaginal bleeding, vaginal discharge, difficulty urinating and vaginal pain.  Musculoskeletal: Positive for back pain.  Skin: Negative for color change, pallor and rash.  Neurological: Negative for weakness, light-headedness and headaches.  Psychiatric/Behavioral: Negative for confusion.    Allergies  Latex  Home Medications   Current Outpatient Rx  Name Route Sig Dispense Refill  . CEPHALEXIN 500 MG PO CAPS Oral Take 1 capsule (500 mg total) by mouth 4 (four) times daily. 28 capsule 0  . PROMETHAZINE HCL 25 MG PO TABS Oral Take 1 tablet (25 mg total) by mouth every 6 (six) hours as needed for nausea. 30 tablet 0    BP 111/74  Pulse 74  Temp 97.8 F (36.6 C) (Oral)  Resp 16  SpO2 100%  LMP 03/09/2012  Physical Exam  Nursing note and vitals reviewed. Constitutional: She is oriented to person, place, and time. She appears well-developed and well-nourished. No distress.  HENT:  Head: Normocephalic and atraumatic.  Mouth/Throat: Oropharynx is clear and moist.  Eyes: Conjunctivae normal are normal.  Neck: Normal range of motion. Neck supple.  Cardiovascular: Normal rate, regular rhythm and normal heart sounds.   Pulmonary/Chest: Effort normal and breath sounds normal.  Abdominal: Soft. Normal appearance and bowel sounds are normal. There is tenderness in the right lower quadrant and suprapubic area. There is guarding. There is no CVA tenderness.  Genitourinary: Uterus is enlarged. Cervix exhibits discharge. Cervix exhibits no  motion tenderness and no friability. Right adnexum displays tenderness. Right adnexum displays no mass. Left adnexum displays no mass and no tenderness. No erythema, tenderness or bleeding around the vagina. No foreign body around the vagina. Vaginal discharge (thick, white, malodorous) found.  Musculoskeletal: Normal range of motion.       Mild  paraspinal muscle tenderness on left  Neurological: She is alert and oriented to person, place, and time.  Psychiatric: She has a normal mood and affect. Her speech is delayed. She is slowed.    ED Course  Procedures (including critical care time)  Labs Reviewed  URINALYSIS, ROUTINE W REFLEX MICROSCOPIC - Abnormal; Notable for the following:    APPearance CLOUDY (*)     Ketones, ur 15 (*)     Leukocytes, UA SMALL (*)     All other components within normal limits  POCT PREGNANCY, URINE - Abnormal; Notable for the following:    Preg Test, Ur POSITIVE (*)     All other components within normal limits  URINE MICROSCOPIC-ADD ON - Abnormal; Notable for the following:    Squamous Epithelial / LPF MANY (*)     Bacteria, UA MANY (*)     All other components within normal limits  GC/CHLAMYDIA PROBE AMP, GENITAL  WET PREP, GENITAL   US Ob Comp + 14 Wk  06/24/2012  *RADIOLOGY REPORT*  Clinical Data: Back and abdominal pain.  ULTRAOUND OB COMP+14 WK - NRPT [facility]  Technique: Limited emergent obstetrical sonography was performed.  Comparison:  04/16/2012  Findings: Single living intrauterine pregnancy noted with cardiac activity 150 beats per minute and spontaneous movement.  Variable presentation noted.  Fundal and anterior placenta noted.  No previa.  Amniotic fluid amount appears subjectively normal.  Biparietal diameter 3.2 cm compatible with 16 weeks 1 day gestation.  Femur length 2.0 cm compatible with 15 weeks 6 days gestation.  Maternal cervix appears closed.  A posterior 3.5 x 2.8 x 4.1 cm thickening in the myometrium is observed.  Given that no fibroid was visible in this vicinity on prior imaging, this likely represents a contraction.  IMPRESSION:  1.  Single living intrauterine pregnancy measuring at 16 weeks 1 day gestation, which is congruent with assigned gestational age. 2.  Suspected contraction accounting for focal posterior myometrial prominence.   Original Report Authenticated By: Dellia Cloud, M.D.      1. BV (bacterial vaginosis)   2. Pregnancy   3. Abdominal pain       MDM  26 y/o female 16 weeks 1 day IUP. Wet prep showing clue cells. I will treat her BV with Flagyl. Advised her to have prenatal care and stressed importance. Advised her to stay well hydrated. No CMT on exam. Will discharge with instructions to f/u with ob/gyn. Case discussed with Dr. Bernette Mayers who agrees with plan of care.        Trevor Mace, PA-C 06/24/12 2049

## 2012-06-24 NOTE — ED Notes (Signed)
Fetal heart tone 136 with doppler. When told that the sound from the doppler was the babies heart beat the pt stated "what do you mean? There's a baby in my stomach?" Explained to pt the baby is in her uterus and that is why heart beat is heard around umbilicus. Pt ask multiple times if she could get an ultrasound to find out how many weeks she is. PA-C in room at this time.

## 2012-06-24 NOTE — ED Notes (Signed)
Pt reports have lower back and abd pain since Friday; denies dysuria, hematuria, abnormal discharge; reports does have n/v but has had that for 4 months per pt, d/t [redacted] weeks pregnant; LMP 6/23; denies bleeding

## 2012-06-24 NOTE — ED Provider Notes (Signed)
Medical screening examination/treatment/procedure(s) were performed by non-physician practitioner and as supervising physician I was immediately available for consultation/collaboration.   Octavian Godek B. Paisleigh Maroney, MD 06/24/12 2247 

## 2012-06-25 LAB — GC/CHLAMYDIA PROBE AMP, GENITAL
Chlamydia, DNA Probe: NEGATIVE
GC Probe Amp, Genital: NEGATIVE

## 2012-07-29 ENCOUNTER — Other Ambulatory Visit (HOSPITAL_COMMUNITY): Payer: Self-pay | Admitting: Obstetrics

## 2012-07-29 DIAGNOSIS — IMO0002 Reserved for concepts with insufficient information to code with codable children: Secondary | ICD-10-CM

## 2012-07-31 ENCOUNTER — Other Ambulatory Visit: Payer: Self-pay

## 2012-07-31 ENCOUNTER — Encounter (HOSPITAL_COMMUNITY): Payer: Self-pay | Admitting: Obstetrics

## 2012-07-31 ENCOUNTER — Ambulatory Visit (HOSPITAL_COMMUNITY): Payer: Medicaid Other

## 2012-08-06 ENCOUNTER — Ambulatory Visit (HOSPITAL_COMMUNITY)
Admission: RE | Admit: 2012-08-06 | Discharge: 2012-08-06 | Disposition: A | Discharge status: Home / Self Care | Payer: Medicaid Other | Source: Ambulatory Visit | Attending: Obstetrics | Admitting: Obstetrics

## 2012-08-06 ENCOUNTER — Ambulatory Visit (HOSPITAL_COMMUNITY): Payer: Medicaid Other

## 2012-08-06 VITALS — BP 110/57 | HR 67 | Wt 178.0 lb

## 2012-08-06 DIAGNOSIS — O09299 Supervision of pregnancy with other poor reproductive or obstetric history, unspecified trimester: Secondary | ICD-10-CM | POA: Insufficient documentation

## 2012-08-06 DIAGNOSIS — Z1389 Encounter for screening for other disorder: Secondary | ICD-10-CM | POA: Insufficient documentation

## 2012-08-06 DIAGNOSIS — Z8751 Personal history of pre-term labor: Secondary | ICD-10-CM | POA: Insufficient documentation

## 2012-08-06 DIAGNOSIS — O34219 Maternal care for unspecified type scar from previous cesarean delivery: Secondary | ICD-10-CM | POA: Insufficient documentation

## 2012-08-06 DIAGNOSIS — IMO0002 Reserved for concepts with insufficient information to code with codable children: Secondary | ICD-10-CM

## 2012-08-06 DIAGNOSIS — O358XX0 Maternal care for other (suspected) fetal abnormality and damage, not applicable or unspecified: Secondary | ICD-10-CM | POA: Insufficient documentation

## 2012-08-06 DIAGNOSIS — Z363 Encounter for antenatal screening for malformations: Secondary | ICD-10-CM | POA: Insufficient documentation

## 2012-08-06 NOTE — Progress Notes (Signed)
Lauren Pineda  was seen today for an ultrasound appointment.  See full report in [facility].  Impression: Single IUP at 21 3/7 weeks Echogenic intracardiac focus noted in the left ventricle Fetal anatomy is otherwise within normal limit No other markers associated with aneuploidy noted Normal amniotic fluid volume  Low risk for aneuploidy by quad screen  Recommendations: Recommend follow up ultrasound in 6 weeks for interval growth due to hx of previous pregnancies with IUGR.  Alpha Gula, MD

## 2012-08-18 ENCOUNTER — Encounter (HOSPITAL_COMMUNITY): Payer: Self-pay | Admitting: Emergency Medicine

## 2012-08-18 ENCOUNTER — Emergency Department (HOSPITAL_COMMUNITY)
Admit: 2012-08-18 | Discharge: 2012-08-18 | Disposition: A | Discharge status: Home / Self Care | Payer: Medicaid Other | Attending: Emergency Medicine | Admitting: Emergency Medicine

## 2012-08-18 ENCOUNTER — Emergency Department (HOSPITAL_COMMUNITY)
Admission: EM | Admit: 2012-08-18 | Discharge: 2012-08-18 | Disposition: A | Discharge status: Home / Self Care | Payer: Medicaid Other | Source: Home / Self Care | Attending: Emergency Medicine | Admitting: Emergency Medicine

## 2012-08-18 DIAGNOSIS — S92919A Unspecified fracture of unspecified toe(s), initial encounter for closed fracture: Secondary | ICD-10-CM

## 2012-08-18 DIAGNOSIS — X58XXXA Exposure to other specified factors, initial encounter: Secondary | ICD-10-CM | POA: Insufficient documentation

## 2012-08-18 DIAGNOSIS — IMO0002 Reserved for concepts with insufficient information to code with codable children: Secondary | ICD-10-CM | POA: Insufficient documentation

## 2012-08-18 MED ORDER — HYDROCODONE-ACETAMINOPHEN 5-325 MG PO TABS: ORAL_TABLET | ORAL | Status: DC

## 2012-08-18 NOTE — ED Provider Notes (Addendum)
Chief Complaint  Patient presents with  . Toe Injury    History of Present Illness:   The patient is a 26 year old female who stubbed her right fourth toe this morning on a wall. Ever since then it's been some distortion and it hurts to walk. She is 6 months pregnant. There is no numbness or tingling.  Review of Systems:  Other than noted above, the patient denies any of the following symptoms: Systemic:  No fevers, chills, sweats, or aches.  No fatigue or tiredness. Musculoskeletal:  No joint pain, arthritis, bursitis, swelling, back pain, or neck pain. Neurological:  No muscular weakness, paresthesias, headache, or trouble with speech or coordination.  No dizziness.  PMFSH:  Past medical history, family history, social history, meds, and allergies were reviewed.  Physical Exam:   Vital signs:  BP 100/63  Pulse 81  Temp 98.4 F (36.9 C) (Oral)  Resp 18  SpO2 98%  LMP 03/09/2012 Gen:  Alert and oriented times 3.  In no distress. Musculoskeletal: There is pain to palpation and obvious deformity over the fourth toe. Otherwise, all joints had a full a ROM with no swelling, bruising or deformity.  No edema, pulses full. Extremities were warm and pink.  Capillary refill was brisk.  Skin:  Clear, warm and dry.  No rash. Neuro:  Alert and oriented times 3.  Muscle strength was normal.  Sensation was intact to light touch.   Radiology:  08/18/2012  *RADIOLOGY REPORT*  Clinical Data: Trauma and pain.  RIGHT FOOT COMPLETE - 3+ VIEW  Comparison: None.  Findings: An oblique fracture of the proximal aspect of the proximal phalanx of the fourth digit.  Minimal displacement and lateral angulation.  No intra-articular extension.  Mild overlying soft tissue swelling.  IMPRESSION: Proximal phalangeal fracture, fourth digit.   Original Report Authenticated By: Jeronimo Greaves, M.D.    The patient was shielded for this examination.  I reviewed the images independently and personally and concur with the  radiologist's findings.  Course in Urgent Care Center:   The toes buddy taped and she was placed in a postoperative boot.  Assessment:  The encounter diagnosis was Toe fracture.  Plan:   1.  The following meds were prescribed:   New Prescriptions   HYDROCODONE-ACETAMINOPHEN (NORCO/VICODIN) 5-325 MG PER TABLET    1 to 2 tabs every 4 to 6 hours as needed for pain.   2.  The patient was instructed in symptomatic care, including rest and activity, elevation, application of ice and compression.  Appropriate handouts were given. 3.  The patient was told to return if becoming worse in any way, if no better in 3 or 4 days, and given some red flag symptoms that would indicate earlier return.   4.  The patient was told to follow up with Dr. Durene Romans later on this week.    Reuben Likes, MD 08/18/12 1717  Reuben Likes, MD 08/20/12 [phone]

## 2012-08-18 NOTE — ED Notes (Signed)
Reports running into the corner of the wall as she was trying to find a light switch this morning.  Reports swelling.  Fourth toe is swollen with a little bruise.  Patient is not able to bend toe.

## 2012-09-17 ENCOUNTER — Inpatient Hospital Stay (HOSPITAL_COMMUNITY)
Admission: AD | Admit: 2012-09-17 | Discharge: 2012-09-17 | Disposition: A | Discharge status: Home / Self Care | Payer: Medicaid Other | Source: Ambulatory Visit | Attending: Obstetrics | Admitting: Obstetrics

## 2012-09-17 ENCOUNTER — Encounter (HOSPITAL_COMMUNITY): Payer: Self-pay | Admitting: *Deleted

## 2012-09-17 DIAGNOSIS — O99891 Other specified diseases and conditions complicating pregnancy: Secondary | ICD-10-CM | POA: Insufficient documentation

## 2012-09-17 DIAGNOSIS — M79606 Pain in leg, unspecified: Secondary | ICD-10-CM

## 2012-09-17 DIAGNOSIS — M79609 Pain in unspecified limb: Secondary | ICD-10-CM | POA: Insufficient documentation

## 2012-09-17 DIAGNOSIS — K30 Functional dyspepsia: Secondary | ICD-10-CM

## 2012-09-17 DIAGNOSIS — K3189 Other diseases of stomach and duodenum: Secondary | ICD-10-CM | POA: Insufficient documentation

## 2012-09-17 LAB — URINALYSIS, ROUTINE W REFLEX MICROSCOPIC
Glucose, UA: NEGATIVE mg/dL
Ketones, ur: 15 mg/dL — AB
pH: 6 (ref 5.0–8.0)

## 2012-09-17 LAB — URINE MICROSCOPIC-ADD ON

## 2012-09-17 MED ORDER — FAMOTIDINE 10 MG PO CHEW: 10.0000 mg | CHEWABLE_TABLET | Freq: Two times a day (BID) | ORAL | Status: DC

## 2012-09-17 MED ORDER — FAMOTIDINE 20 MG PO TABS
40.0000 mg | ORAL_TABLET | Freq: Once | ORAL | Status: AC
  Administered 2012-09-17: 40 mg via ORAL
  Filled 2012-09-17: qty 2

## 2012-09-17 MED ORDER — CYCLOBENZAPRINE HCL 10 MG PO TABS
10.0000 mg | ORAL_TABLET | Freq: Once | ORAL | Status: AC
  Administered 2012-09-17: 10 mg via ORAL
  Filled 2012-09-17: qty 1

## 2012-09-17 MED ORDER — CYCLOBENZAPRINE HCL 5 MG PO TABS: 5.0000 mg | ORAL_TABLET | Freq: Three times a day (TID) | ORAL | Status: DC | PRN

## 2012-09-17 NOTE — MAU Note (Signed)
Pt state she is having trouble breathing on she lays on  Her back and her sides. Pt states " Whatever I have drunk right before a go to bed comes up when I sleep,and I wake up throwing up."

## 2012-09-17 NOTE — MAU Provider Note (Signed)
  History     CSN: [id number]  Arrival date and time: 09/17/12 2008   First Provider Initiated Contact with Patient 09/17/12 2050      Chief Complaint  Patient presents with  . Airway Obstruction  . Gastrophageal Reflux   HPI  Lauren Pineda is a 27 y.o. [phone] at [redacted]w[redacted]d who presents today with trouble breathing when she lays flat on her back, reflux at night when she is sleeping and leg pain. She states that all of these symptoms have been going on for about 2 weeks, and she has not mentioned them to her doctor.   History reviewed. No pertinent past medical history.  Past Surgical History  Procedure Date  . Cesarean section     Family History  Problem Relation Age of Onset  . Asthma Father   . Asthma Brother     History  Substance Use Topics  . Smoking status: Current Every Day Smoker  . Smokeless tobacco: Not on file  . Alcohol Use: No    Allergies:  Allergies  Allergen Reactions  . Latex Itching    Prescriptions prior to admission  Medication Sig Dispense Refill  . HYDROcodone-acetaminophen (NORCO/VICODIN) 5-325 MG per tablet 1 to 2 tabs every 4 to 6 hours as needed for pain.  20 tablet  0  . [DISCONTINUED] metroNIDAZOLE (FLAGYL) 500 MG tablet Take 1 tablet (500 mg total) by mouth 2 (two) times daily. One po bid x 7 days  14 tablet  0    Review of Systems  Constitutional: Negative for fever and chills.  Eyes: Negative for blurred vision.  Respiratory: Positive for shortness of breath. Negative for cough, sputum production and wheezing.   Cardiovascular: Negative for chest pain.  Gastrointestinal: Positive for heartburn (with reflux). Negative for nausea, vomiting, abdominal pain, diarrhea and constipation.  Genitourinary: Negative for dysuria, urgency and frequency.  Musculoskeletal: Negative for myalgias.  Neurological: Positive for headaches. Negative for dizziness.   Physical Exam   Blood pressure 101/66, pulse 98, temperature 98.5 F (36.9 C),  temperature source Oral, resp. rate 18, last menstrual period 03/09/2012, SpO2 98.00%.  Physical Exam  Nursing note and vitals reviewed. Constitutional: She is oriented to person, place, and time. She appears well-developed and well-nourished. No distress.  Cardiovascular: Normal rate.   Respiratory: Effort normal and breath sounds normal. No respiratory distress. She has no wheezes.  GI: Soft. She exhibits no distension. There is no tenderness.  Neurological: She is alert and oriented to person, place, and time.  Skin: Skin is warm and dry.  Psychiatric: She has a normal mood and affect.   FHT: 140, moderate with 15x15 accels, no decels Toco: no UCs Cat I MAU Course  Procedures  2211: Pt states that she is feeling better after pepcid and flexeril Assessment and Plan   1. Acid indigestion   2. Musculoskeletal leg pain    RX Pepcid 10mg  PO BID Flexeril 5 mg TID PRN Comfort measures discussed Encouraged sleeping upright 3rd trimester danger signs reviewed   Lauren Pineda 09/17/2012, 8:52 PM

## 2012-09-17 NOTE — Progress Notes (Signed)
Pt states her right leg gives out on her when she walks

## 2012-09-18 ENCOUNTER — Ambulatory Visit (HOSPITAL_COMMUNITY)
Admission: RE | Admit: 2012-09-18 | Discharge: 2012-09-18 | Disposition: A | Discharge status: Home / Self Care | Payer: Medicaid Other | Source: Ambulatory Visit | Attending: Obstetrics | Admitting: Obstetrics

## 2012-09-18 VITALS — BP 102/61 | HR 95 | Wt 179.5 lb

## 2012-09-18 DIAGNOSIS — O09299 Supervision of pregnancy with other poor reproductive or obstetric history, unspecified trimester: Secondary | ICD-10-CM | POA: Insufficient documentation

## 2012-09-18 DIAGNOSIS — Z8751 Personal history of pre-term labor: Secondary | ICD-10-CM | POA: Insufficient documentation

## 2012-09-18 DIAGNOSIS — O34219 Maternal care for unspecified type scar from previous cesarean delivery: Secondary | ICD-10-CM | POA: Insufficient documentation

## 2012-09-18 DIAGNOSIS — O358XX0 Maternal care for other (suspected) fetal abnormality and damage, not applicable or unspecified: Secondary | ICD-10-CM | POA: Insufficient documentation

## 2012-09-18 NOTE — Progress Notes (Signed)
Lauren Pineda  was seen today for an ultrasound appointment.  See full report in [facility].  Impression: Single IUP at 27 4/7 weeks Normal interval anatomy Fetal growth is appropriate (55th %tile) Normal amniotic fluid volume  Recommendations: Recommend follow up ultrasound in 4 weeks for interval growth due to hx of previous pregnancies with IUGR.  Alpha Gula, MD

## 2012-10-16 ENCOUNTER — Inpatient Hospital Stay (HOSPITAL_COMMUNITY): Admission: RE | Admit: 2012-10-16 | Payer: Medicaid Other | Source: Ambulatory Visit

## 2012-10-16 ENCOUNTER — Ambulatory Visit (HOSPITAL_COMMUNITY)
Admission: RE | Admit: 2012-10-16 | Discharge: 2012-10-16 | Disposition: A | Discharge status: Home / Self Care | Payer: Medicaid Other | Source: Ambulatory Visit | Attending: Obstetrics | Admitting: Obstetrics

## 2012-10-16 DIAGNOSIS — O358XX0 Maternal care for other (suspected) fetal abnormality and damage, not applicable or unspecified: Secondary | ICD-10-CM | POA: Insufficient documentation

## 2012-10-16 DIAGNOSIS — O09299 Supervision of pregnancy with other poor reproductive or obstetric history, unspecified trimester: Secondary | ICD-10-CM | POA: Insufficient documentation

## 2012-10-16 DIAGNOSIS — O34219 Maternal care for unspecified type scar from previous cesarean delivery: Secondary | ICD-10-CM | POA: Insufficient documentation

## 2012-10-16 DIAGNOSIS — Z8751 Personal history of pre-term labor: Secondary | ICD-10-CM | POA: Insufficient documentation

## 2012-10-16 NOTE — Progress Notes (Signed)
Lauren Pineda  was seen today for an ultrasound appointment.  See full report in [facility].  Impression: Single IUP at 31 4/7 weeks Echogenic intracardiac focus again noted Otherwise normal interval anatomy Fetal growth is appropriate (62nd %tile) Normal amniotic fluid volume  Recommendations: Follow-up ultrasounds as clinically indicated.   Alpha Gula, MD

## 2012-11-01 ENCOUNTER — Other Ambulatory Visit: Payer: Self-pay

## 2012-11-10 ENCOUNTER — Other Ambulatory Visit: Payer: Self-pay | Admitting: Obstetrics

## 2012-11-23 ENCOUNTER — Encounter (HOSPITAL_COMMUNITY): Payer: Self-pay

## 2012-11-23 ENCOUNTER — Inpatient Hospital Stay (HOSPITAL_COMMUNITY): Payer: Medicaid Other | Admitting: Registered Nurse

## 2012-11-23 ENCOUNTER — Inpatient Hospital Stay (HOSPITAL_COMMUNITY)
Admission: AD | Admit: 2012-11-23 | Discharge: 2012-11-26 | DRG: 766 | Disposition: A | Discharge status: Home / Self Care | Payer: Medicaid Other | Source: Ambulatory Visit | Attending: Obstetrics | Admitting: Obstetrics

## 2012-11-23 ENCOUNTER — Encounter (HOSPITAL_COMMUNITY): Payer: Self-pay | Admitting: Registered Nurse

## 2012-11-23 ENCOUNTER — Encounter (HOSPITAL_COMMUNITY)
Admission: AD | Disposition: A | Discharge status: Home / Self Care | Payer: Self-pay | Source: Ambulatory Visit | Attending: Obstetrics

## 2012-11-23 DIAGNOSIS — O34219 Maternal care for unspecified type scar from previous cesarean delivery: Principal | ICD-10-CM | POA: Diagnosis present

## 2012-11-23 DIAGNOSIS — O99892 Other specified diseases and conditions complicating childbirth: Secondary | ICD-10-CM | POA: Diagnosis present

## 2012-11-23 DIAGNOSIS — Z22330 Carrier of Group B streptococcus: Secondary | ICD-10-CM

## 2012-11-23 LAB — TYPE AND SCREEN
ABO/RH(D): O POS
Antibody Screen: NEGATIVE

## 2012-11-23 LAB — CBC
MCH: 29.3 pg (ref 26.0–34.0)
MCHC: 32.4 g/dL (ref 30.0–36.0)
Platelets: 270 10*3/uL (ref 150–400)

## 2012-11-23 LAB — RPR: RPR Ser Ql: NONREACTIVE

## 2012-11-23 SURGERY — Surgical Case
Anesthesia: Spinal | Site: Abdomen | Wound class: Clean Contaminated

## 2012-11-23 MED ORDER — NALOXONE HCL 1 MG/ML IJ SOLN: 1.0000 ug/kg/h | INTRAVENOUS | Status: DC | PRN

## 2012-11-23 MED ORDER — WITCH HAZEL-GLYCERIN EX PADS
1.0000 "application " | MEDICATED_PAD | CUTANEOUS | Status: DC | PRN
  Administered 2012-11-23 – 2012-11-24 (×2): 1 via TOPICAL

## 2012-11-23 MED ORDER — FAMOTIDINE 20 MG PO TABS
20.0000 mg | ORAL_TABLET | Freq: Once | ORAL | Status: AC
  Administered 2012-11-23: 20 mg via ORAL
  Filled 2012-11-23: qty 1

## 2012-11-23 MED ORDER — KETOROLAC TROMETHAMINE 30 MG/ML IJ SOLN
30.0000 mg | Freq: Four times a day (QID) | INTRAMUSCULAR | Status: AC | PRN
  Administered 2012-11-23 (×2): 30 mg via INTRAVENOUS
  Filled 2012-11-23: qty 1

## 2012-11-23 MED ORDER — ONDANSETRON HCL 4 MG/2ML IJ SOLN: 4.0000 mg | INTRAMUSCULAR | Status: DC | PRN

## 2012-11-23 MED ORDER — SENNOSIDES-DOCUSATE SODIUM 8.6-50 MG PO TABS
2.0000 | ORAL_TABLET | Freq: Every day | ORAL | Status: DC
  Administered 2012-11-23 – 2012-11-25 (×3): 2 via ORAL

## 2012-11-23 MED ORDER — METOCLOPRAMIDE HCL 5 MG/ML IJ SOLN
INTRAMUSCULAR | Status: AC
  Administered 2012-11-23: 10 mg
  Filled 2012-11-23: qty 2

## 2012-11-23 MED ORDER — LANOLIN HYDROUS EX OINT: 1.0000 "application " | TOPICAL_OINTMENT | CUTANEOUS | Status: DC | PRN

## 2012-11-23 MED ORDER — LACTATED RINGERS IV SOLN
INTRAVENOUS | Status: DC
  Administered 2012-11-23 (×3): via INTRAVENOUS

## 2012-11-23 MED ORDER — OXYTOCIN 40 UNITS IN LACTATED RINGERS INFUSION - SIMPLE MED: 62.5000 mL/h | INTRAVENOUS | Status: AC

## 2012-11-23 MED ORDER — SIMETHICONE 80 MG PO CHEW
80.0000 mg | CHEWABLE_TABLET | Freq: Three times a day (TID) | ORAL | Status: DC
  Administered 2012-11-23 – 2012-11-26 (×9): 80 mg via ORAL

## 2012-11-23 MED ORDER — TETANUS-DIPHTH-ACELL PERTUSSIS 5-2.5-18.5 LF-MCG/0.5 IM SUSP
0.5000 mL | Freq: Once | INTRAMUSCULAR | Status: AC
  Administered 2012-11-24: 0.5 mL via INTRAMUSCULAR

## 2012-11-23 MED ORDER — MEPERIDINE HCL 25 MG/ML IJ SOLN: 6.2500 mg | INTRAMUSCULAR | Status: DC | PRN

## 2012-11-23 MED ORDER — ONDANSETRON HCL 4 MG/2ML IJ SOLN
INTRAMUSCULAR | Status: DC | PRN
  Administered 2012-11-23: 4 mg via INTRAVENOUS

## 2012-11-23 MED ORDER — MORPHINE SULFATE (PF) 0.5 MG/ML IJ SOLN
INTRAMUSCULAR | Status: DC | PRN
  Administered 2012-11-23: .1 mg via INTRATHECAL

## 2012-11-23 MED ORDER — DIPHENHYDRAMINE HCL 25 MG PO CAPS: 25.0000 mg | ORAL_CAPSULE | Freq: Four times a day (QID) | ORAL | Status: DC | PRN

## 2012-11-23 MED ORDER — CITRIC ACID-SODIUM CITRATE 334-500 MG/5ML PO SOLN
30.0000 mL | Freq: Once | ORAL | Status: AC
  Administered 2012-11-23: 30 mL via ORAL
  Filled 2012-11-23: qty 15

## 2012-11-23 MED ORDER — 0.9 % SODIUM CHLORIDE (POUR BTL) OPTIME
TOPICAL | Status: DC | PRN
  Administered 2012-11-23: 1000 mL

## 2012-11-23 MED ORDER — DIBUCAINE 1 % RE OINT
1.0000 "application " | TOPICAL_OINTMENT | RECTAL | Status: DC | PRN
  Administered 2012-11-24: 1 via RECTAL
  Filled 2012-11-23: qty 28

## 2012-11-23 MED ORDER — FENTANYL CITRATE 0.05 MG/ML IJ SOLN
25.0000 ug | INTRAMUSCULAR | Status: DC | PRN
  Administered 2012-11-23: 50 ug via INTRAVENOUS

## 2012-11-23 MED ORDER — CEFAZOLIN SODIUM-DEXTROSE 2-3 GM-% IV SOLR
2.0000 g | Freq: Once | INTRAVENOUS | Status: AC
  Administered 2012-11-23: 2 g via INTRAVENOUS

## 2012-11-23 MED ORDER — ACETAMINOPHEN 10 MG/ML IV SOLN: 1000.0000 mg | Freq: Four times a day (QID) | INTRAVENOUS | Status: AC | PRN

## 2012-11-23 MED ORDER — PROMETHAZINE HCL 25 MG/ML IJ SOLN: 6.2500 mg | INTRAMUSCULAR | Status: DC | PRN

## 2012-11-23 MED ORDER — SCOPOLAMINE 1 MG/3DAYS TD PT72
MEDICATED_PATCH | TRANSDERMAL | Status: AC
  Filled 2012-11-23: qty 1

## 2012-11-23 MED ORDER — FENTANYL CITRATE 0.05 MG/ML IJ SOLN
INTRAMUSCULAR | Status: DC | PRN
  Administered 2012-11-23: 25 ug via INTRATHECAL

## 2012-11-23 MED ORDER — IBUPROFEN 600 MG PO TABS
600.0000 mg | ORAL_TABLET | Freq: Four times a day (QID) | ORAL | Status: DC
  Administered 2012-11-23 – 2012-11-26 (×12): 600 mg via ORAL
  Filled 2012-11-23 (×12): qty 1

## 2012-11-23 MED ORDER — LACTATED RINGERS IV SOLN
INTRAVENOUS | Status: DC | PRN
  Administered 2012-11-23: 07:00:00 via INTRAVENOUS

## 2012-11-23 MED ORDER — KETOROLAC TROMETHAMINE 30 MG/ML IJ SOLN
INTRAMUSCULAR | Status: AC
  Filled 2012-11-23: qty 1

## 2012-11-23 MED ORDER — PHENYLEPHRINE HCL 10 MG/ML IJ SOLN
INTRAMUSCULAR | Status: DC | PRN
  Administered 2012-11-23 (×3): 80 ug via INTRAVENOUS
  Administered 2012-11-23 (×2): 40 ug via INTRAVENOUS
  Administered 2012-11-23 (×6): 80 ug via INTRAVENOUS

## 2012-11-23 MED ORDER — MENTHOL 3 MG MT LOZG: 1.0000 | LOZENGE | OROMUCOSAL | Status: DC | PRN

## 2012-11-23 MED ORDER — METOCLOPRAMIDE HCL 5 MG/ML IJ SOLN: 10.0000 mg | Freq: Three times a day (TID) | INTRAMUSCULAR | Status: DC | PRN

## 2012-11-23 MED ORDER — ZOLPIDEM TARTRATE 5 MG PO TABS: 5.0000 mg | ORAL_TABLET | Freq: Every evening | ORAL | Status: DC | PRN

## 2012-11-23 MED ORDER — DIPHENHYDRAMINE HCL 50 MG/ML IJ SOLN: 25.0000 mg | INTRAMUSCULAR | Status: DC | PRN

## 2012-11-23 MED ORDER — NALBUPHINE HCL 10 MG/ML IJ SOLN
5.0000 mg | INTRAMUSCULAR | Status: DC | PRN
  Filled 2012-11-23: qty 1

## 2012-11-23 MED ORDER — FAMOTIDINE IN NACL 20-0.9 MG/50ML-% IV SOLN
20.0000 mg | Freq: Once | INTRAVENOUS | Status: AC
  Administered 2012-11-23: 20 mg via INTRAVENOUS
  Filled 2012-11-23: qty 50

## 2012-11-23 MED ORDER — DIPHENHYDRAMINE HCL 50 MG/ML IJ SOLN
12.5000 mg | INTRAMUSCULAR | Status: DC | PRN
  Administered 2012-11-23 (×2): 12.5 mg via INTRAVENOUS
  Filled 2012-11-23: qty 1

## 2012-11-23 MED ORDER — BUPIVACAINE IN DEXTROSE 0.75-8.25 % IT SOLN
INTRATHECAL | Status: DC | PRN
  Administered 2012-11-23: 12 mg via INTRATHECAL

## 2012-11-23 MED ORDER — NALOXONE HCL 0.4 MG/ML IJ SOLN: 0.4000 mg | INTRAMUSCULAR | Status: DC | PRN

## 2012-11-23 MED ORDER — ONDANSETRON HCL 4 MG/2ML IJ SOLN: 4.0000 mg | Freq: Three times a day (TID) | INTRAMUSCULAR | Status: DC | PRN

## 2012-11-23 MED ORDER — PRENATAL MULTIVITAMIN CH
1.0000 | ORAL_TABLET | Freq: Every day | ORAL | Status: DC
  Administered 2012-11-24 – 2012-11-26 (×3): 1 via ORAL
  Filled 2012-11-23 (×3): qty 1

## 2012-11-23 MED ORDER — OXYTOCIN 10 UNIT/ML IJ SOLN
40.0000 [IU] | INTRAVENOUS | Status: DC | PRN
  Administered 2012-11-23: 40 [IU] via INTRAVENOUS

## 2012-11-23 MED ORDER — SIMETHICONE 80 MG PO CHEW: 80.0000 mg | CHEWABLE_TABLET | ORAL | Status: DC | PRN

## 2012-11-23 MED ORDER — TERBUTALINE SULFATE 1 MG/ML IJ SOLN
0.2500 mg | Freq: Once | INTRAMUSCULAR | Status: AC
  Administered 2012-11-23: 0.25 mg via SUBCUTANEOUS
  Filled 2012-11-23: qty 1

## 2012-11-23 MED ORDER — SODIUM CHLORIDE 0.9 % IJ SOLN: 3.0000 mL | INTRAMUSCULAR | Status: DC | PRN

## 2012-11-23 MED ORDER — DIPHENHYDRAMINE HCL 25 MG PO CAPS
25.0000 mg | ORAL_CAPSULE | ORAL | Status: DC | PRN
  Administered 2012-11-23 – 2012-11-24 (×3): 25 mg via ORAL
  Filled 2012-11-23 (×4): qty 1

## 2012-11-23 MED ORDER — KETOROLAC TROMETHAMINE 30 MG/ML IJ SOLN: 30.0000 mg | Freq: Four times a day (QID) | INTRAMUSCULAR | Status: AC | PRN

## 2012-11-23 MED ORDER — MIDAZOLAM HCL 2 MG/2ML IJ SOLN: 0.5000 mg | Freq: Once | INTRAMUSCULAR | Status: DC | PRN

## 2012-11-23 MED ORDER — DIPHENHYDRAMINE HCL 50 MG/ML IJ SOLN
INTRAMUSCULAR | Status: AC
  Filled 2012-11-23: qty 1

## 2012-11-23 MED ORDER — ONDANSETRON HCL 4 MG PO TABS: 4.0000 mg | ORAL_TABLET | ORAL | Status: DC | PRN

## 2012-11-23 MED ORDER — SCOPOLAMINE 1 MG/3DAYS TD PT72
1.0000 | MEDICATED_PATCH | Freq: Once | TRANSDERMAL | Status: AC
  Administered 2012-11-23: 1.5 mg via TRANSDERMAL

## 2012-11-23 MED ORDER — OXYCODONE-ACETAMINOPHEN 5-325 MG PO TABS
1.0000 | ORAL_TABLET | ORAL | Status: DC | PRN
  Administered 2012-11-23 (×2): 1 via ORAL
  Administered 2012-11-24 (×4): 2 via ORAL
  Administered 2012-11-24: 1 via ORAL
  Administered 2012-11-24 – 2012-11-26 (×8): 2 via ORAL
  Filled 2012-11-23 (×2): qty 2
  Filled 2012-11-23: qty 1
  Filled 2012-11-23 (×3): qty 2
  Filled 2012-11-23 (×2): qty 1
  Filled 2012-11-23: qty 2
  Filled 2012-11-23: qty 1
  Filled 2012-11-23 (×3): qty 2
  Filled 2012-11-23: qty 1
  Filled 2012-11-23 (×2): qty 2

## 2012-11-23 MED ORDER — LACTATED RINGERS IV SOLN
INTRAVENOUS | Status: DC
  Administered 2012-11-23: 14:00:00 via INTRAVENOUS

## 2012-11-23 MED ORDER — FENTANYL CITRATE 0.05 MG/ML IJ SOLN
INTRAMUSCULAR | Status: AC
  Administered 2012-11-23: 50 ug
  Filled 2012-11-23: qty 2

## 2012-11-23 SURGICAL SUPPLY — 37 items
ADH SKN CLS APL DERMABOND .7 (GAUZE/BANDAGES/DRESSINGS)
CLOTH BEACON ORANGE TIMEOUT ST (SAFETY) ×2 IMPLANT
DERMABOND ADVANCED (GAUZE/BANDAGES/DRESSINGS)
DERMABOND ADVANCED .7 DNX12 (GAUZE/BANDAGES/DRESSINGS) ×1 IMPLANT
DRAPE LG THREE QUARTER DISP (DRAPES) ×2 IMPLANT
DRSG OPSITE POSTOP 4X10 (GAUZE/BANDAGES/DRESSINGS) ×2 IMPLANT
DURAPREP 26ML APPLICATOR (WOUND CARE) ×2 IMPLANT
ELECT REM PT RETURN 9FT ADLT (ELECTROSURGICAL) ×2
ELECTRODE REM PT RTRN 9FT ADLT (ELECTROSURGICAL) ×1 IMPLANT
EXTRACTOR VACUUM M CUP 4 TUBE (SUCTIONS) IMPLANT
GLOVE BIO SURGEON STRL SZ8.5 (GLOVE) ×1 IMPLANT
GLOVE INDICATOR 7.0 STRL GRN (GLOVE) ×2 IMPLANT
GLOVE SKINSENSE N 8.5 STRL (GLOVE) ×1 IMPLANT
GLOVE SKINSENSE NS SZ7.0 (GLOVE) ×2
GLOVE SKINSENSE STRL SZ7.0 (GLOVE) IMPLANT
GOWN PREVENTION PLUS XXLARGE (GOWN DISPOSABLE) ×2 IMPLANT
GOWN STRL REIN XL XLG (GOWN DISPOSABLE) ×4 IMPLANT
KIT ABG SYR 3ML LUER SLIP (SYRINGE) IMPLANT
NDL HYPO 25X5/8 SAFETYGLIDE (NEEDLE) ×1 IMPLANT
NEEDLE HYPO 25X5/8 SAFETYGLIDE (NEEDLE) IMPLANT
NS IRRIG 1000ML POUR BTL (IV SOLUTION) ×2 IMPLANT
PACK C SECTION WH (CUSTOM PROCEDURE TRAY) ×2 IMPLANT
PAD OB MATERNITY 4.3X12.25 (PERSONAL CARE ITEMS) ×2 IMPLANT
SLEEVE SCD COMPRESS KNEE MED (MISCELLANEOUS) ×1 IMPLANT
SUT CHROMIC 0 CT 802H (SUTURE) ×2 IMPLANT
SUT CHROMIC 1 CTX 36 (SUTURE) ×4 IMPLANT
SUT CHROMIC 2 0 SH (SUTURE) ×2 IMPLANT
SUT GUT PLAIN 0 CT-3 TAN 27 (SUTURE) IMPLANT
SUT MON AB 4-0 PS1 27 (SUTURE) ×2 IMPLANT
SUT VIC AB 0 CT1 18XCR BRD8 (SUTURE) IMPLANT
SUT VIC AB 0 CT1 8-18 (SUTURE)
SUT VIC AB 0 CTX 36 (SUTURE) ×4
SUT VIC AB 0 CTX36XBRD ANBCTRL (SUTURE) ×2 IMPLANT
TOWEL OR 17X24 6PK STRL BLUE (TOWEL DISPOSABLE) ×6 IMPLANT
TRAY FOLEY BAG SILVER LF 16FR (CATHETERS) ×1 IMPLANT
TRAY FOLEY CATH 14FR (SET/KITS/TRAYS/PACK) ×1 IMPLANT
WATER STERILE IRR 1000ML POUR (IV SOLUTION) ×2 IMPLANT

## 2012-11-23 NOTE — Anesthesia Postprocedure Evaluation (Addendum)
  Anesthesia Post-op Note  Patient: Lauren Pineda  Procedure(s) Performed: Procedure(s) (LRB): CESAREAN SECTION (N/A)  Patient Location: PACU  Anesthesia Type: Spinal  Level of Consciousness: awake and alert   Airway and Oxygen Therapy: Patient Spontanous Breathing  Post-op Pain: mild  Post-op Assessment: Post-op Vital signs reviewed, Patient's Cardiovascular Status Stable, Respiratory Function Stable, Patent Airway and No signs of Nausea or vomiting  Last Vitals:  Filed Vitals:   11/23/12 0034  BP: 106/71  Pulse: 105  Temp: 37.3 C  Resp: 18    Post-op Vital Signs: stable   Complications: No apparent anesthesia complications \

## 2012-11-23 NOTE — Anesthesia Postprocedure Evaluation (Signed)
  Anesthesia Post-op Note  Patient: Lauren Pineda  Procedure(s) Performed: Procedure(s) with comments: CESAREAN SECTION (N/A) - Repeat Cesarean Section Delivery Baby    @      , Apgars   Patient Location: Mother/Baby  Anesthesia Type:Spinal  Level of Consciousness: awake  Airway and Oxygen Therapy: Patient Spontanous Breathing  Post-op Pain: mild  Post-op Assessment: Patient's Cardiovascular Status Stable and Respiratory Function Stable  Post-op Vital Signs: stable  Complications: No apparent anesthesia complications

## 2012-11-23 NOTE — Transfer of Care (Signed)
Immediate Anesthesia Transfer of Care Note  Patient: Lauren Pineda  Procedure(s) Performed: Procedure(s) with comments: CESAREAN SECTION (N/A) - Repeat Cesarean Section Delivery Baby    @      , Apgars   Patient Location: PACU  Anesthesia Type:Spinal  Level of Consciousness: awake, alert  and oriented  Airway & Oxygen Therapy: Patient Spontanous Breathing  Post-op Assessment: Report given to PACU RN and Post -op Vital signs reviewed and stable  Post vital signs: Reviewed and stable  Complications: No apparent anesthesia complications

## 2012-11-23 NOTE — Op Note (Signed)
preop diagnosis previous cesarean section at 37 weeks active labor Postop diagnosis is same Anesthesia spinal Surgeon Dr. Francoise Ceo Procedure patient placed on the operating table in the supine position after the spinal administered abdomen prepped and draped bladder emptied with a Foley catheter a transverse suprapubic incision was made carried down to the rectus fascia fascia cleaned and incised the length of the incision recti muscles retracted laterally peritoneum incised longitudinally a transverse incision made in the visceroperitoneum above the bladder and the bladder mobilized inferiorly transverse low uterine incision made fluid clear patient delivered from the LOA position of a female Apgar 99 the team was in attendance the placenta was anterior removed manually and sent to labor and delivery uterine cavity clean with dry laps the uterine incision closed in one layer with continuous   one chromic bladder flap reattached to a chromic uterus well contracted tubes and ovaries normal abdomen closed in layers peritoneum continuous with 2-0 chromic fascia continuous with of 0 Dexon and the skin closed in with 4-0 Monocryl blood loss 600 cc patient tolerated the procedure well

## 2012-11-23 NOTE — Anesthesia Preprocedure Evaluation (Signed)
Anesthesia Evaluation  Patient identified by MRN, date of birth, ID band Patient awake    Reviewed: Allergy & Precautions, H&P , NPO status , Patient's Chart, lab work & pertinent test results  Airway Mallampati: II      Dental no notable dental hx.    Pulmonary neg pulmonary ROS,  breath sounds clear to auscultation  Pulmonary exam normal       Cardiovascular Exercise Tolerance: Good negative cardio ROS  Rhythm:regular Rate:Normal     Neuro/Psych negative neurological ROS  negative psych ROS   GI/Hepatic negative GI ROS, Neg liver ROS,   Endo/Other  negative endocrine ROS  Renal/GU negative Renal ROS  negative genitourinary   Musculoskeletal   Abdominal Normal abdominal exam  (+)   Peds  Hematology negative hematology ROS (+)   Anesthesia Other Findings   Reproductive/Obstetrics (+) Pregnancy                           Anesthesia Physical Anesthesia Plan  ASA: II and emergent  Anesthesia Plan: Spinal   Post-op Pain Management:    Induction:   Airway Management Planned:   Additional Equipment:   Intra-op Plan:   Post-operative Plan:   Informed Consent: I have reviewed the patients History and Physical, chart, labs and discussed the procedure including the risks, benefits and alternatives for the proposed anesthesia with the patient or authorized representative who has indicated his/her understanding and acceptance.     Plan Discussed with: Anesthesiologist, CRNA and Surgeon  Anesthesia Plan Comments:         Anesthesia Quick Evaluation

## 2012-11-23 NOTE — Anesthesia Procedure Notes (Signed)
Spinal  Patient location during procedure: OR Start time: 11/23/2012 6:17 AM Staffing Anesthesiologist: Angus Seller., Harrell Gave. Performed by: anesthesiologist  Preanesthetic Checklist Completed: patient identified, site marked, surgical consent, pre-op evaluation, timeout performed, IV checked, risks and benefits discussed and monitors and equipment checked Spinal Block Patient position: sitting Prep: DuraPrep Patient monitoring: heart rate, cardiac monitor, continuous pulse ox and blood pressure Approach: midline Location: L3-4 Injection technique: single-shot Needle Needle type: Sprotte  Needle gauge: 24 G Needle length: 9 cm Assessment Sensory level: T4 Additional Notes Patient identified.  Risk benefits discussed including failed block, incomplete pain control, headache, nerve damage, paralysis, blood pressure changes, nausea, vomiting, reactions to medication both toxic or allergic, and postpartum back pain.  Patient expressed understanding and wished to proceed.  All questions were answered.  Sterile technique used throughout procedure.  CSF was clear.  No parasthesia or other complications.  Please see nursing notes for vital signs.

## 2012-11-23 NOTE — H&P (Signed)
This is Dr. Francoise Ceo dictating the history and physical on  Lauren Pineda a 27 year old gravida 6 para 1-203 at 37 weeks she's had 2 previous C-sections and now comes in in early labor 3 cm dilated positive GBS her EDC is 12/14/2012 and she's for repeat C-section Past medical history negative Past surgical history 2 previous C-sections Social history negative system review negative Physical exam well-developed female in labor HEENT negative Breasts negative Lungs clear to P&A Heart regular rhythm no murmurs no gallops Abdomen term Pelvic cervix 3 cm dilated Extremities negative

## 2012-11-23 NOTE — MAU Note (Signed)
Pt states contractions started around 930pm. Has not timed her contractions but says that they "keep coming" Denies vaginal bleeding or leaking of fluid.

## 2012-11-23 NOTE — Preoperative (Signed)
Beta Blockers   Reason not to administer Beta Blockers:Not Applicable 

## 2012-11-24 ENCOUNTER — Encounter (HOSPITAL_COMMUNITY): Payer: Self-pay | Admitting: Obstetrics

## 2012-11-24 LAB — CBC
HCT: 25.1 % — ABNORMAL LOW (ref 36.0–46.0)
MCHC: 31.9 g/dL (ref 30.0–36.0)
MCV: 90.9 fL (ref 78.0–100.0)
RDW: 14.7 % (ref 11.5–15.5)

## 2012-11-24 MED ORDER — INFLUENZA VIRUS VACC SPLIT PF IM SUSP: 0.5000 mL | INTRAMUSCULAR | Status: AC

## 2012-11-24 NOTE — Progress Notes (Signed)
UR chart review completed.  

## 2012-11-24 NOTE — Progress Notes (Signed)
Patient ID: Lauren Pineda, female   DOB: [birth year], 27 y.o.   MRN: [id number] Postop day 1 Vital signs normal Abdomen soft Legs negative No complaints

## 2012-11-25 ENCOUNTER — Encounter (HOSPITAL_COMMUNITY): Payer: Self-pay | Admitting: Pharmacist

## 2012-11-25 NOTE — Progress Notes (Signed)
Patient ID: Lauren Pineda, female   DOB: Sep 12, 1986, 27 y.o.   MRN: [id number]   day 2 Vital signs normal Fundus firm Lochia moderate Legs negative doing well

## 2012-11-26 NOTE — Discharge Summary (Signed)
Obstetric Discharge Summary Reason for Admission: onset of labor Prenatal Procedures: none Intrapartum Procedures: spontaneous vaginal delivery and cesarean: low cervical, transverse Postpartum Procedures: none Complications-Operative and Postpartum: none Hemoglobin  Date Value Range Status  11/24/2012 8.0* 12.0 - 15.0 g/dL Final     DELTA CHECK NOTED     REPEATED TO VERIFY     HCT  Date Value Range Status  11/24/2012 25.1* 36.0 - 46.0 % Final    Physical Exam:  General: alert Lochia: appropriate Uterine Fundus: firm Incision: healing well DVT Evaluation: No evidence of DVT seen on physical exam.  Discharge Diagnoses: Term Pregnancy-delivered  Discharge Information: Date: 11/26/2012 Activity: pelvic rest Diet: routine Medications: Percocet Condition: stable Instructions: refer to practice specific booklet Discharge to: home Follow-up Information   Follow up with MARSHALL,BERNARD A, MD. Schedule an appointment as soon as possible for a visit in 6 weeks.   Contact information:   [address] [place] [address] [phone]       Newborn Data: Live born female  Birth Weight: 7 lb 0.9 oz (3200 g) APGAR: 9, 9  Home with mother.  MARSHALL,BERNARD A 11/26/2012, 7:19 AM

## 2012-11-26 NOTE — Progress Notes (Signed)
Patient ID: Lauren Pineda, female   DOB: [birth year], 27 y.o.   MRN: [id number] Postop day 3 Vital signs normal Fundus firm Incision clean and dry Legs negative home today

## 2012-12-03 ENCOUNTER — Telehealth (HOSPITAL_COMMUNITY): Payer: Self-pay | Admitting: *Deleted

## 2012-12-03 NOTE — Telephone Encounter (Signed)
Resolve episode 

## 2012-12-10 ENCOUNTER — Inpatient Hospital Stay (HOSPITAL_COMMUNITY): Admit: 2012-12-10 | Payer: Self-pay | Admitting: Obstetrics

## 2012-12-10 ENCOUNTER — Encounter (HOSPITAL_COMMUNITY): Payer: Self-pay

## 2012-12-10 SURGERY — Surgical Case
Anesthesia: Regional

## 2013-05-03 ENCOUNTER — Encounter (HOSPITAL_COMMUNITY): Payer: Self-pay | Admitting: Emergency Medicine

## 2013-05-03 ENCOUNTER — Emergency Department (HOSPITAL_COMMUNITY)
Admission: EM | Admit: 2013-05-03 | Discharge: 2013-05-04 | Disposition: A | Discharge status: Home / Self Care | Payer: Medicaid Other | Attending: Emergency Medicine | Admitting: Emergency Medicine

## 2013-05-03 DIAGNOSIS — R1310 Dysphagia, unspecified: Secondary | ICD-10-CM | POA: Insufficient documentation

## 2013-05-03 DIAGNOSIS — R509 Fever, unspecified: Secondary | ICD-10-CM | POA: Insufficient documentation

## 2013-05-03 DIAGNOSIS — R059 Cough, unspecified: Secondary | ICD-10-CM | POA: Insufficient documentation

## 2013-05-03 DIAGNOSIS — F17200 Nicotine dependence, unspecified, uncomplicated: Secondary | ICD-10-CM | POA: Insufficient documentation

## 2013-05-03 DIAGNOSIS — J020 Streptococcal pharyngitis: Secondary | ICD-10-CM

## 2013-05-03 DIAGNOSIS — R110 Nausea: Secondary | ICD-10-CM | POA: Insufficient documentation

## 2013-05-03 DIAGNOSIS — R05 Cough: Secondary | ICD-10-CM | POA: Insufficient documentation

## 2013-05-03 DIAGNOSIS — J029 Acute pharyngitis, unspecified: Secondary | ICD-10-CM | POA: Insufficient documentation

## 2013-05-03 MED ORDER — ACETAMINOPHEN 325 MG PO TABS
650.0000 mg | ORAL_TABLET | Freq: Once | ORAL | Status: AC
  Administered 2013-05-03: 650 mg via ORAL
  Filled 2013-05-03: qty 2

## 2013-05-03 NOTE — ED Notes (Signed)
PT. REPORTS SORE THROAT WITH CHILLS, BODY ACHES , BILATERAL EAR ACHE AND HEADACHE ONSET YESTERDAY .

## 2013-05-04 MED ORDER — DEXAMETHASONE SODIUM PHOSPHATE 10 MG/ML IJ SOLN
10.0000 mg | Freq: Once | INTRAMUSCULAR | Status: AC
  Administered 2013-05-04: 10 mg via INTRAMUSCULAR
  Filled 2013-05-04: qty 1

## 2013-05-04 MED ORDER — PROMETHAZINE HCL 25 MG PO TABS: 25.0000 mg | ORAL_TABLET | Freq: Four times a day (QID) | ORAL | Status: DC | PRN

## 2013-05-04 MED ORDER — IBUPROFEN 400 MG PO TABS
800.0000 mg | ORAL_TABLET | Freq: Once | ORAL | Status: AC
  Administered 2013-05-04: 800 mg via ORAL
  Filled 2013-05-04: qty 2

## 2013-05-04 MED ORDER — PENICILLIN G BENZATHINE 1200000 UNIT/2ML IM SUSP
1.2000 10*6.[IU] | Freq: Once | INTRAMUSCULAR | Status: AC
  Administered 2013-05-04: 1.2 10*6.[IU] via INTRAMUSCULAR
  Filled 2013-05-04: qty 2

## 2013-05-04 NOTE — ED Provider Notes (Signed)
  CSN: [id number]     Arrival date & time 05/03/13  2219 History     First MD Initiated Contact with Patient 05/04/13 0012     Chief Complaint  Patient presents with  . Sore Throat   (Consider location/radiation/quality/duration/timing/severity/associated sxs/prior Treatment) HPI Comments: Patient is a 27 year old female who presents with a 2 day history of sore throat. Patient reports gradual onset and progressively worsening sharp, severe throat pain. The pain is constant and made worse with swallowing. The pain is localized to the patient's throat and equal on both sides. Nothing alleviates the pain. The patient has not tried anything for symptom relief. Patient reports associated subjective fever, cervical adenopathy, and non productive cough. Patient denies headache, visual changes, sinus congestion, difficulty breathing, chest pain, SOB, abdominal pain, NVD.      History reviewed. No pertinent past medical history. Past Surgical History  Procedure Laterality Date  . Cesarean section    . Cesarean section N/A 11/23/2012    Procedure: CESAREAN SECTION;  Surgeon: Kathreen Cosier, MD;  Location: [facility];  Service: Obstetrics;  Laterality: N/A;  Repeat Cesarean Section Delivery Baby    @      , Apgars    Family History  Problem Relation Age of Onset  . Asthma Father   . Asthma Brother    History  Substance Use Topics  . Smoking status: Current Every Day Smoker  . Smokeless tobacco: Not on file  . Alcohol Use: No   OB History   Grav Para Term Preterm Abortions TAB SAB Ect Mult Living   6 4 2 2 2  2   4      Review of Systems  Constitutional: Positive for chills.  HENT: Positive for sore throat.   Gastrointestinal: Positive for nausea.  All other systems reviewed and are negative.    Allergies  Latex  Home Medications  No current outpatient prescriptions on file. BP 117/74  Pulse 97  Temp(Src) 101.2 F (38.4 C) (Oral)  Resp 14  SpO2 100%  LMP 04/29/2013 Physical  Exam  Nursing note and vitals reviewed. Constitutional: She is oriented to person, place, and time. She appears well-developed and well-nourished. No distress.  HENT:  Head: Normocephalic and atraumatic.  Bilateral tonsillar edema and erythema with exudate.   Eyes: Conjunctivae and EOM are normal.  Neck: Normal range of motion.  Cardiovascular: Normal rate and regular rhythm.  Exam reveals no gallop and no friction rub.   No murmur heard. Pulmonary/Chest: Effort normal and breath sounds normal. She has no wheezes. She has no rales. She exhibits no tenderness.  Musculoskeletal: Normal range of motion.  Lymphadenopathy:    She has cervical adenopathy.  Neurological: She is alert and oriented to person, place, and time. Coordination normal.  Speech is goal-oriented. Moves limbs without ataxia.   Skin: Skin is warm and dry.  Psychiatric: She has a normal mood and affect. Her behavior is normal.    ED Course   Procedures (including critical care time)  Labs Reviewed  RAPID STREP SCREEN  CULTURE, GROUP A STREP   No results found. 1. Strep throat     MDM  12:27 AM Patient has strep throat based on clinical appearance. Patient febrile here and given tylenol. Patient will have 1.2 million units Pen G and IM decadron. Patient instructed to return with worsening or concerning symptoms.   Emilia Beck, PA-C 05/04/13 0031

## 2013-05-04 NOTE — ED Notes (Signed)
Pt has had no reaction observed or reported at this time to abx given. Pt will be discharged at this time.

## 2013-05-04 NOTE — ED Provider Notes (Signed)
Medical screening examination/treatment/procedure(s) were performed by non-physician practitioner and as supervising physician I was immediately available for consultation/collaboration.  Deaunte Dente M Mykela Mewborn, MD 05/04/13 0447 

## 2013-05-06 LAB — CULTURE, GROUP A STREP

## 2013-07-23 ENCOUNTER — Other Ambulatory Visit: Payer: Self-pay

## 2014-01-27 ENCOUNTER — Inpatient Hospital Stay (HOSPITAL_COMMUNITY)
Admission: AD | Admit: 2014-01-27 | Discharge: 2014-01-28 | Disposition: A | Discharge status: Home / Self Care | Payer: Medicaid Other | Source: Ambulatory Visit | Attending: Obstetrics | Admitting: Obstetrics

## 2014-01-27 ENCOUNTER — Encounter (HOSPITAL_COMMUNITY): Payer: Self-pay

## 2014-01-27 DIAGNOSIS — F17200 Nicotine dependence, unspecified, uncomplicated: Secondary | ICD-10-CM | POA: Insufficient documentation

## 2014-01-27 DIAGNOSIS — R109 Unspecified abdominal pain: Secondary | ICD-10-CM | POA: Insufficient documentation

## 2014-01-27 DIAGNOSIS — Y9229 Other specified public building as the place of occurrence of the external cause: Secondary | ICD-10-CM | POA: Insufficient documentation

## 2014-01-27 DIAGNOSIS — O034 Incomplete spontaneous abortion without complication: Secondary | ICD-10-CM

## 2014-01-27 DIAGNOSIS — W010XXA Fall on same level from slipping, tripping and stumbling without subsequent striking against object, initial encounter: Secondary | ICD-10-CM | POA: Insufficient documentation

## 2014-01-27 HISTORY — DX: Other specified health status: Z78.9

## 2014-01-27 LAB — URINALYSIS, ROUTINE W REFLEX MICROSCOPIC
BILIRUBIN URINE: NEGATIVE
Glucose, UA: NEGATIVE mg/dL
KETONES UR: NEGATIVE mg/dL
Leukocytes, UA: NEGATIVE
NITRITE: NEGATIVE
PROTEIN: NEGATIVE mg/dL
Specific Gravity, Urine: >1.03 — ABNORMAL HIGH (ref 1.005–1.030)
UROBILINOGEN UA: 0.2 mg/dL (ref 0.0–1.0)
pH: 5.5 (ref 5.0–8.0)

## 2014-01-27 LAB — URINE MICROSCOPIC-ADD ON

## 2014-01-27 LAB — CBC
HCT: 35.3 % — ABNORMAL LOW (ref 36.0–46.0)
HEMOGLOBIN: 11.7 g/dL — AB (ref 12.0–15.0)
MCH: 30.7 pg (ref 26.0–34.0)
MCHC: 33.1 g/dL (ref 30.0–36.0)
MCV: 92.7 fL (ref 78.0–100.0)
Platelets: 244 10*3/uL (ref 150–400)
RBC: 3.81 MIL/uL — ABNORMAL LOW (ref 3.87–5.11)
RDW: 13.8 % (ref 11.5–15.5)
WBC: 8.3 10*3/uL (ref 4.0–10.5)

## 2014-01-27 LAB — HCG, QUANTITATIVE, PREGNANCY: hCG, Beta Chain, Quant, S: 8879 m[IU]/mL — ABNORMAL HIGH (ref <5)

## 2014-01-27 LAB — POCT PREGNANCY, URINE: PREG TEST UR: POSITIVE — AB

## 2014-01-27 NOTE — MAU Provider Note (Signed)
Chief Complaint: Possible Pregnancy, Fall and Vaginal Bleeding   First Provider Initiated Contact with Patient 01/27/14 2337     SUBJECTIVE HPI: Lauren Pineda is a 28 y.o. [id number] at 6733w4d by LMP who presents with pink spotting and abdominal cramping after slipping and falling on her side in a store tonight. Denies passage of tissue. No testing so far this pregnancy. Plans to get prenatal care with Dr. Gaynell FaceMarshall, but has not started yet.   Past Medical History  Diagnosis Date  . Medical history non-contributory    OB History  Gravida Para Term Preterm AB SAB TAB Ectopic Multiple Living  7 4 2 2 2 2    4     # Outcome Date GA Lbr Len/2nd Weight Sex Delivery Anes PTL Lv  7 CUR           6 TRM 11/23/12 4193w0d  3.2 kg (7 lb 0.9 oz) M LTCS Spinal  Y  5 PRE 09/19/08 2681w0d   M LTCS Spinal N Y     Comments: IUGR  4 PRE 07/12/07 2338w0d   F LTCS EPI Y Y     Comments: Breech  3 TRM 10/03/06    F SVD EPI N Y  2 SAB 2005    U      1 SAB              Past Surgical History  Procedure Laterality Date  . Cesarean section    . Cesarean section N/A 11/23/2012    Procedure: CESAREAN SECTION;  Surgeon: Kathreen CosierBernard A Marshall, MD;  Location: [facility];  Service: Obstetrics;  Laterality: N/A;  Repeat Cesarean Section Delivery Baby    @      , Apgars    History   Social History  . Marital Status: Single    Spouse Name: N/A    Number of Children: N/A  . Years of Education: N/A   Occupational History  . Not on file.   Social History Main Topics  . Smoking status: Current Every Day Smoker  . Smokeless tobacco: Not on file  . Alcohol Use: No  . Drug Use: No  . Sexual Activity: Yes   Other Topics Concern  . Not on file   Social History Narrative  . No narrative on file   No current facility-administered medications on file prior to encounter.   Current Outpatient Prescriptions on File Prior to Encounter  Medication Sig Dispense Refill  . promethazine (PHENERGAN) 25 MG tablet Take 1 tablet (25  mg total) by mouth every 6 (six) hours as needed for nausea.  12 tablet  0   Allergies  Allergen Reactions  . Latex Itching    ROS: Pertinent positive items in HPI. Negative for fever, chills, vaginal discharge, passage of tissue, urinary complaints, GI complaints, flank pain.  OBJECTIVE Blood pressure 116/78, pulse 62, temperature 98.6 F (37 C), temperature source Oral, resp. rate 18, height 5\' 4"  (1.626 m), weight 79.379 kg (175 lb), last menstrual period 11/14/2013, SpO2 100.00%, not currently breastfeeding. GENERAL: Well-developed, well-nourished female in no acute distress.  HEENT: Normocephalic HEART: normal rate RESP: normal effort ABDOMEN: Soft, non-tender. Positive bowel sounds x4. No CVA tenderness. EXTREMITIES: Nontender, no edema NEURO: Alert and oriented SPECULUM EXAM: NEFG, declined. Reports no bleeding on pad. BIMANUAL: Declined   LAB RESULTS Results for orders placed during the hospital encounter of 01/27/14 (from the past 24 hour(s))  URINALYSIS, ROUTINE W REFLEX MICROSCOPIC     Status: Abnormal   Collection  Time    01/27/14 10:39 PM      Result Value Ref Range   Color, Urine YELLOW  YELLOW   APPearance CLEAR  CLEAR   Specific Gravity, Urine >1.030 (*) 1.005 - 1.030   pH 5.5  5.0 - 8.0   Glucose, UA NEGATIVE  NEGATIVE mg/dL   Hgb urine dipstick MODERATE (*) NEGATIVE   Bilirubin Urine NEGATIVE  NEGATIVE   Ketones, ur NEGATIVE  NEGATIVE mg/dL   Protein, ur NEGATIVE  NEGATIVE mg/dL   Urobilinogen, UA 0.2  0.0 - 1.0 mg/dL   Nitrite NEGATIVE  NEGATIVE   Leukocytes, UA NEGATIVE  NEGATIVE  URINE MICROSCOPIC-ADD ON     Status: Abnormal   Collection Time    01/27/14 10:39 PM      Result Value Ref Range   Squamous Epithelial / LPF RARE  RARE   WBC, UA 0-2  <3 WBC/hpf   RBC / HPF 3-6  <3 RBC/hpf   Bacteria, UA FEW (*) RARE  POCT PREGNANCY, URINE     Status: Abnormal   Collection Time    01/27/14 10:54 PM      Result Value Ref Range   Preg Test, Ur POSITIVE  (*) NEGATIVE  HCG, QUANTITATIVE, PREGNANCY     Status: Abnormal   Collection Time    01/27/14 11:25 PM      Result Value Ref Range   hCG, Beta Chain, Quant, S 8879 (*) <5 mIU/mL  CBC     Status: Abnormal   Collection Time    01/27/14 11:25 PM      Result Value Ref Range   WBC 8.3  4.0 - 10.5 K/uL   RBC 3.81 (*) 3.87 - 5.11 MIL/uL   Hemoglobin 11.7 (*) 12.0 - 15.0 g/dL   HCT 69.6 (*) 29.5 - 28.4 %   MCV 92.7  78.0 - 100.0 fL   MCH 30.7  26.0 - 34.0 pg   MCHC 33.1  30.0 - 36.0 g/dL   RDW 13.2  44.0 - 10.2 %   Platelets 244  150 - 400 K/uL    IMAGING US Ob Comp Less 14 Wks  01/28/2014   CLINICAL DATA:  Cramping and spotting after a fall. Estimated gestational age by LMP is 10 weeks 5 days. Quantitative beta HCG is 8,879.  EXAM: OBSTETRIC <14 WK [place] AND TRANSVAGINAL OB US  TECHNIQUE: Both transabdominal and transvaginal ultrasound examinations were performed for complete evaluation of the gestation as well as the maternal uterus, adnexal regions, and pelvic cul-de-sac. Transvaginal technique was performed to assess early pregnancy.  COMPARISON:  None.  FINDINGS: Intrauterine gestational sac: A single intrauterine pregnancy is visualized with an irregular shape of the gestational sac and suggestion of debris within the sac. Gestational sac is demonstrated in the lower uterine segment.  Yolk sac:  Not visualized  Embryo:  A fetal pole is demonstrated.  Cardiac Activity: No fetal cardiac activity.  CRL:   8.4  mm   6 w 6 d                  [place] EDC: 09/17/2014  Maternal uterus/adnexae: Multiple subchorionic hemorrhages, largest measuring about 3 cm maximal diameter. Both ovaries are visualized. No abnormal adnexal masses are seen. Normal follicular changes are suggested in the ovaries. No free pelvic fluid collections.  IMPRESSION: Irregular-appearing gestational sac with fetal pole smaller than expected by clinical dates. No fetal cardiac activity. Findings meet definitive criteria for failed  pregnancy. This follows SRU consensus guidelines:  Diagnostic Criteria for Nonviable Pregnancy Early in the First Trimester. Macy Mis Engl J Med [phone];369:1443-51.   Electronically Signed   By: Burman NievesWilliam  Stevens M.D.   On: 01/28/2014 01:25   [place] Ob Transvaginal  01/28/2014   CLINICAL DATA:  Cramping and spotting after a fall. Estimated gestational age by LMP is 10 weeks 5 days. Quantitative beta HCG is 8,879.  EXAM: OBSTETRIC <14 WK US AND TRANSVAGINAL OB US  TECHNIQUE: Both transabdominal and transvaginal ultrasound examinations were performed for complete evaluation of the gestation as well as the maternal uterus, adnexal regions, and pelvic cul-de-sac. Transvaginal technique was performed to assess early pregnancy.  COMPARISON:  None.  FINDINGS: Intrauterine gestational sac: A single intrauterine pregnancy is visualized with an irregular shape of the gestational sac and suggestion of debris within the sac. Gestational sac is demonstrated in the lower uterine segment.  Yolk sac:  Not visualized  Embryo:  A fetal pole is demonstrated.  Cardiac Activity: No fetal cardiac activity.  CRL:   8.4  mm   6 w 6 d                  [place] EDC: 09/17/2014  Maternal uterus/adnexae: Multiple subchorionic hemorrhages, largest measuring about 3 cm maximal diameter. Both ovaries are visualized. No abnormal adnexal masses are seen. Normal follicular changes are suggested in the ovaries. No free pelvic fluid collections.  IMPRESSION: Irregular-appearing gestational sac with fetal pole smaller than expected by clinical dates. No fetal cardiac activity. Findings meet definitive criteria for failed pregnancy. This follows SRU consensus guidelines: Diagnostic Criteria for Nonviable Pregnancy Early in the First Trimester. Macy Mis Engl J Med [phone];369:1443-51.   Electronically Signed   By: Burman NievesWilliam  Stevens M.D.   On: 01/28/2014 01:25   MAU COURSE  ASSESSMENT 1. Incomplete miscarriage     PLAN Discharge home in stable condition. Support  given. Offered chaplain, declined. Discussed management of incomplete miscarriage including expected management, Cytotec or D&C. Will manage expectantly for now. Bleeding precautions.     Follow-up Information   Call MARSHALL,BERNARD A, MD. (Office in the morning to schedule followup appointment)    Specialty:  Obstetrics and Gynecology   Contact information:   [address] [place] [address] [phone]       [facility] with [facility]. (As needed in emergencies)    Contact information:   [address] [id number] [place] [address] [phone]       Medication List         ibuprofen 600 MG tablet  Commonly known as:  ADVIL,MOTRIN  Take 1 tablet (600 mg total) by mouth every 6 (six) hours as needed for cramping.     promethazine 25 MG tablet  Commonly known as:  PHENERGAN  Take 1 tablet (25 mg total) by mouth every 6 (six) hours as needed for nausea.       [place] Kyandre Okray, CNM 01/28/2014  1:49 AM

## 2014-01-27 NOTE — MAU Note (Signed)
Pt states she fell about 1850 tonight, pt doesn't know how she landed but she does remember sliding across the floor

## 2014-01-27 NOTE — MAU Note (Signed)
Positive pregnancy test 12/04/13. LMP 11/14/13. Pt states she fell in the store this evening around 7pm; slipped in puddle of water & fell on side, not sure which side. Lower abdominal cramping since fall. Pink spotting this evening when wipes. Hasn't been seen with this pregnancy yet; Dr. Gaynell FaceMarshall delivered last baby in 2014.

## 2014-01-28 ENCOUNTER — Inpatient Hospital Stay (HOSPITAL_COMMUNITY): Payer: Medicaid Other

## 2014-01-28 DIAGNOSIS — O034 Incomplete spontaneous abortion without complication: Secondary | ICD-10-CM

## 2014-01-28 MED ORDER — IBUPROFEN 600 MG PO TABS: 600.0000 mg | ORAL_TABLET | Freq: Four times a day (QID) | ORAL | Status: DC | PRN

## 2014-01-28 NOTE — Discharge Instructions (Signed)
Incomplete Miscarriage °A miscarriage is the sudden loss of an unborn baby (fetus) before the 20th week of pregnancy. In an incomplete miscarriage, parts of the fetus or placenta (afterbirth) remain in the body.  °Having a miscarriage can be an emotional experience. Talk with your health care provider about any questions you may have about miscarrying, the grieving process, and your future pregnancy plans. °CAUSES  °· Problems with the fetal chromosomes that make it impossible for the baby to develop normally. Problems with the baby's genes or chromosomes are most often the result of errors that occur by chance as the embryo divides and grows. The problems are not inherited from the parents. °· Infection of the cervix or uterus. °· Hormone problems. °· Problems with the cervix, such as having an incompetent cervix. This is when the tissue in the cervix is not strong enough to hold the pregnancy. °· Problems with the uterus, such as an abnormally shaped uterus, uterine fibroids, or congenital abnormalities. °· Certain medical conditions. °· Smoking, drinking alcohol, or taking illegal drugs. °· Trauma. °SYMPTOMS  °· Vaginal bleeding or spotting, with or without cramps or pain. °· Pain or cramping in the abdomen or lower back. °· Passing fluid, tissue, or blood clots from the vagina. °DIAGNOSIS  °Your health care provider will perform a physical exam. You may also have an ultrasound to confirm the miscarriage. Blood or urine tests may also be ordered. °TREATMENT  °· Usually, a dilation and curettage (D&C) procedure is performed. During a D&C procedure, the cervix is widened (dilated) and any remaining fetal or placental tissue is gently removed from the uterus. °· Antibiotic medicines are prescribed if there is an infection. Other medicines may be given to reduce the size of the uterus (contract) if there is a lot of bleeding. °· If you have Rh negative blood and your baby was Rh positive, you will need an Rho(D)  immune globulin shot. This shot will protect any future baby from having Rh blood problems in future pregnancies. °· You may be confined to bed rest. This means you should stay in bed and only get up to use the bathroom. °HOME CARE INSTRUCTIONS  °· Rest as directed by your health care provider. °· Restrict activity as directed by your health care provider. You may be allowed to continue light activity if curettage was not done but you require further treatment. °· Keep track of the number of pads you use each day. Keep track of how soaked (saturated) they are. Record this information. °· Do not  use tampons. °· Do not douche or have sexual intercourse until approved by your health care provider. °· Keep all follow-up appointments for re-evaluation and continuing management. °· Only take over-the-counter or prescription medicines for pain, fever, or discomfort as directed by your health care provider. °· Take antibiotic medicine as directed by your health care provider. Make sure you finish it even if you start to feel better. °SEEK IMMEDIATE MEDICAL CARE IF:  °· You experience severe cramps in your stomach, back, or abdomen. °· You have an unexplained temperature (make sure to record these temperatures). °· You pass large clots or tissue (save these for your health care provider to inspect). °· Your bleeding increases. °· You become light-headed, weak, or have fainting episodes. °MAKE SURE YOU:  °· Understand these instructions. °· Will watch your condition. °· Will get help right away if you are not doing well or get worse. °Document Released: 09/03/2005 Document Revised: 06/24/2013 Document Reviewed: 04/02/2013 °  Exit[employer] Patient Information 2014 [place] [place].   Miscarriage A miscarriage is the sudden loss of an unborn baby (fetus) before the 20th week of pregnancy. Most miscarriages happen in the first 3 months of pregnancy. Sometimes, it happens before a woman even knows she is pregnant. A miscarriage is  also called a "spontaneous miscarriage" or "early pregnancy loss." Having a miscarriage can be an emotional experience. Talk with your caregiver about any questions you may have about miscarrying, the grieving process, and your future pregnancy plans. CAUSES   Problems with the fetal chromosomes that make it impossible for the baby to develop normally. Problems with the baby's genes or chromosomes are most often the result of errors that occur, by chance, as the embryo divides and grows. The problems are not inherited from the parents.  Infection of the cervix or uterus.   Hormone problems.   Problems with the cervix, such as having an incompetent cervix. This is when the tissue in the cervix is not strong enough to hold the pregnancy.   Problems with the uterus, such as an abnormally shaped uterus, uterine fibroids, or congenital abnormalities.   Certain medical conditions.   Smoking, drinking alcohol, or taking illegal drugs.   Trauma.  Often, the cause of a miscarriage is unknown.  SYMPTOMS   Vaginal bleeding or spotting, with or without cramps or pain.  Pain or cramping in the abdomen or lower back.  Passing fluid, tissue, or blood clots from the vagina. DIAGNOSIS  Your caregiver will perform a physical exam. You may also have an ultrasound to confirm the miscarriage. Blood or urine tests may also be ordered. TREATMENT   Sometimes, treatment is not necessary if you naturally pass all the fetal tissue that was in the uterus. If some of the fetus or placenta remains in the body (incomplete miscarriage), tissue left behind may become infected and must be removed. Usually, a dilation and curettage (D and C) procedure is performed. During a D and C procedure, the cervix is widened (dilated) and any remaining fetal or placental tissue is gently removed from the uterus.  Antibiotic medicines are prescribed if there is an infection. Other medicines may be given to reduce the size  of the uterus (contract) if there is a lot of bleeding.  If you have Rh negative blood and your baby was Rh positive, you will need a Rh immunoglobulin shot. This shot will protect any future baby from having Rh blood problems in future pregnancies. HOME CARE INSTRUCTIONS   Your caregiver may order bed rest or may allow you to continue light activity. Resume activity as directed by your caregiver.  Have someone help with home and family responsibilities during this time.   Keep track of the number of sanitary pads you use each day and how soaked (saturated) they are. Write down this information.   Do not use tampons. Do not douche or have sexual intercourse until approved by your caregiver.   Only take over-the-counter or prescription medicines for pain or discomfort as directed by your caregiver.   Do not take aspirin. Aspirin can cause bleeding.   Keep all follow-up appointments with your caregiver.   If you or your partner have problems with grieving, talk to your caregiver or seek counseling to help cope with the pregnancy loss. Allow enough time to grieve before trying to get pregnant again.  SEEK IMMEDIATE MEDICAL CARE IF:   You have severe cramps or pain in your back or abdomen.  You have  a fever.  You pass large blood clots (walnut-sized or larger) ortissue from your vagina. Save any tissue for your caregiver to inspect.   Your bleeding increases.   You have a thick, bad-smelling vaginal discharge.  You become lightheaded, weak, or you faint.   You have chills.  MAKE SURE YOU:  Understand these instructions.  Will watch your condition.  Will get help right away if you are not doing well or get worse. Document Released: 02/27/2001 Document Revised: 12/29/2012 Document Reviewed: 10/23/2011 [facility] Patient Information 2014 [place], [place].

## 2014-02-25 ENCOUNTER — Telehealth: Payer: Self-pay

## 2014-02-25 NOTE — Telephone Encounter (Signed)
Patient called requesting a call back. Per chart review, patient is not a patient of ours but was seen in MAU 5/13/42m for miscarriage.

## 2014-02-25 NOTE — Telephone Encounter (Signed)
Attempted to call patient. Lauren Pineda mailbox is full. Unable to leave message.

## 2014-02-26 ENCOUNTER — Ambulatory Visit: Payer: Medicaid Other | Admitting: Obstetrics & Gynecology

## 2014-02-26 NOTE — Telephone Encounter (Signed)
Called pt and was unable to leave a message due to VM box full.

## 2014-03-13 ENCOUNTER — Inpatient Hospital Stay (HOSPITAL_COMMUNITY)
Admission: AD | Admit: 2014-03-13 | Discharge: 2014-03-14 | Disposition: A | Discharge status: Home / Self Care | Payer: Medicaid Other | Source: Ambulatory Visit | Attending: Obstetrics & Gynecology | Admitting: Obstetrics & Gynecology

## 2014-03-13 ENCOUNTER — Inpatient Hospital Stay (HOSPITAL_COMMUNITY): Payer: Medicaid Other

## 2014-03-13 ENCOUNTER — Encounter (HOSPITAL_COMMUNITY): Payer: Self-pay

## 2014-03-13 DIAGNOSIS — R109 Unspecified abdominal pain: Secondary | ICD-10-CM | POA: Insufficient documentation

## 2014-03-13 DIAGNOSIS — O039 Complete or unspecified spontaneous abortion without complication: Secondary | ICD-10-CM

## 2014-03-13 DIAGNOSIS — O031 Delayed or excessive hemorrhage following incomplete spontaneous abortion: Secondary | ICD-10-CM | POA: Insufficient documentation

## 2014-03-13 DIAGNOSIS — Z87891 Personal history of nicotine dependence: Secondary | ICD-10-CM | POA: Insufficient documentation

## 2014-03-13 DIAGNOSIS — O034 Incomplete spontaneous abortion without complication: Secondary | ICD-10-CM

## 2014-03-13 LAB — HCG, QUANTITATIVE, PREGNANCY: hCG, Beta Chain, Quant, S: 117 m[IU]/mL — ABNORMAL HIGH

## 2014-03-13 LAB — CBC
HCT: 37.5 % (ref 36.0–46.0)
Hemoglobin: 12.3 g/dL (ref 12.0–15.0)
MCH: 30.8 pg (ref 26.0–34.0)
MCHC: 32.8 g/dL (ref 30.0–36.0)
MCV: 93.8 fL (ref 78.0–100.0)
Platelets: 169 10*3/uL (ref 150–400)
RBC: 4 MIL/uL (ref 3.87–5.11)
RDW: 13.3 % (ref 11.5–15.5)
WBC: 8.5 10*3/uL (ref 4.0–10.5)

## 2014-03-13 LAB — URINALYSIS, ROUTINE W REFLEX MICROSCOPIC
Bilirubin Urine: NEGATIVE
Glucose, UA: NEGATIVE mg/dL
Ketones, ur: NEGATIVE mg/dL
Leukocytes, UA: NEGATIVE
Nitrite: NEGATIVE
Protein, ur: NEGATIVE mg/dL
Specific Gravity, Urine: >1.03 — ABNORMAL HIGH (ref 1.005–1.030)
Urobilinogen, UA: 0.2 mg/dL (ref 0.0–1.0)
pH: 5.5 (ref 5.0–8.0)

## 2014-03-13 LAB — WET PREP, GENITAL
Trich, Wet Prep: NONE SEEN
Yeast Wet Prep HPF POC: NONE SEEN

## 2014-03-13 LAB — URINE MICROSCOPIC-ADD ON

## 2014-03-13 MED ORDER — KETOROLAC TROMETHAMINE 60 MG/2ML IM SOLN
60.0000 mg | Freq: Once | INTRAMUSCULAR | Status: AC
  Administered 2014-03-13: 60 mg via INTRAMUSCULAR
  Filled 2014-03-13: qty 2

## 2014-03-13 MED ORDER — PROMETHAZINE HCL 25 MG PO TABS: 12.5000 mg | ORAL_TABLET | Freq: Four times a day (QID) | ORAL | Status: DC | PRN

## 2014-03-13 MED ORDER — HYDROCODONE-ACETAMINOPHEN 5-325 MG PO TABS: 1.0000 | ORAL_TABLET | ORAL | Status: DC | PRN

## 2014-03-13 MED ORDER — IBUPROFEN 800 MG PO TABS: 800.0000 mg | ORAL_TABLET | Freq: Three times a day (TID) | ORAL | Status: DC | PRN

## 2014-03-13 MED ORDER — MISOPROSTOL 200 MCG PO TABS
800.0000 ug | ORAL_TABLET | Freq: Once | ORAL | Status: AC
  Administered 2014-03-13: 800 ug via VAGINAL
  Filled 2014-03-13: qty 4

## 2014-03-13 MED ORDER — HYDROCODONE-ACETAMINOPHEN 5-325 MG PO TABS
2.0000 | ORAL_TABLET | Freq: Once | ORAL | Status: AC
Start: 2014-03-13 — End: 2014-03-13
  Administered 2014-03-13: 2 via ORAL
  Filled 2014-03-13: qty 2

## 2014-03-13 NOTE — MAU Provider Note (Signed)
Attestation of Attending Supervision of Advanced Practitioner (PA/CNM/NP): Evaluation and management procedures were performed by the Advanced Practitioner under my supervision and collaboration.  I have reviewed the Advanced Practitioner's note and chart, and I agree with the management and plan.  UGONNA  ANYANWU, MD, FACOG Attending Obstetrician & Gynecologist Faculty Practice, [facility] -    

## 2014-03-13 NOTE — MAU Provider Note (Signed)
History     CSN: [id number]  Arrival date and time: 03/13/14 1947   None     Chief Complaint  Patient presents with  . Abdominal Pain  . Vaginal Bleeding  . Dysuria   Patient is a 28 y.o. female presenting with abdominal pain, vaginal bleeding, and dysuria. The history is provided by the patient.  Abdominal Pain The primary symptoms of the illness include abdominal pain, dysuria and vaginal bleeding. The primary symptoms of the illness do not include fever. The current episode started more than 2 days ago. The problem has been gradually worsening.  Symptoms associated with the illness do not include diaphoresis.  Vaginal Bleeding Associated symptoms include abdominal pain. Pertinent negatives include no diaphoresis or fever.  Dysuria     RN note: Pt reports she was seen here 05/15 and was told she was having a SAB and was set up for a D&C but she missed the appointment. States she has started having pain and bleeding. Dysuria.      Past Medical History  Diagnosis Date  . Medical history non-contributory     Past Surgical History  Procedure Laterality Date  . Cesarean section    . Cesarean section N/A 11/23/2012    Procedure: CESAREAN SECTION;  Surgeon: Kathreen Cosier, MD;  Location: [facility];  Service: Obstetrics;  Laterality: N/A;  Repeat Cesarean Section Delivery Baby    @      , Apgars     Family History  Problem Relation Age of Onset  . Asthma Father   . Asthma Brother     History  Substance Use Topics  . Smoking status: Former Games developer  . Smokeless tobacco: Not on file  . Alcohol Use: No    Allergies:  Allergies  Allergen Reactions  . Latex Itching    No prescriptions prior to admission    Review of Systems  Constitutional: Negative for fever and diaphoresis.  Gastrointestinal: Positive for abdominal pain.  Genitourinary: Positive for dysuria and vaginal bleeding.   Physical Exam   Blood pressure 120/79, pulse 57, temperature 98.7 F (37.1  C), temperature source Oral, resp. rate 18, height 5\' 4"  (1.626 m), weight 80.74 kg (178 lb), last menstrual period 11/14/2013, SpO2 100.00%, not currently breastfeeding.  Physical Exam  Nursing note and vitals reviewed. Constitutional: She appears well-developed and well-nourished. No distress.  HENT:  Head: Normocephalic.  Eyes: Pupils are equal, round, and reactive to light.  Neck: Normal range of motion.  Cardiovascular: Normal rate.   Respiratory: Effort normal.  GI: Soft. There is tenderness. There is guarding. There is no rebound.  Genitourinary:  Mod amount of frothy bloody discharge; cervix parous, closed, +CMT; bimanual diffusely tender; firm enlarged uterus- adnexa difficult to outline  Musculoskeletal: Normal range of motion.  Neurological: She is alert.  Skin: Skin is warm and dry.  Psychiatric:  Flat affect- pt states she is depressed and would like help dealing with some issues- pt is not suicidal at this time    MAU Course  Procedures Care turned over to Thressa Sheller CNM Results for orders placed during the hospital encounter of 03/13/14 (from the past 24 hour(s))  URINALYSIS, ROUTINE W REFLEX MICROSCOPIC     Status: Abnormal   Collection Time    03/13/14  8:01 PM      Result Value Ref Range   Color, Urine YELLOW  YELLOW   APPearance CLEAR  CLEAR   Specific Gravity, Urine >1.030 (*) 1.005 - 1.030   pH  5.5  5.0 - 8.0   Glucose, UA NEGATIVE  NEGATIVE mg/dL   Hgb urine dipstick LARGE (*) NEGATIVE   Bilirubin Urine NEGATIVE  NEGATIVE   Ketones, ur NEGATIVE  NEGATIVE mg/dL   Protein, ur NEGATIVE  NEGATIVE mg/dL   Urobilinogen, UA 0.2  0.0 - 1.0 mg/dL   Nitrite NEGATIVE  NEGATIVE   Leukocytes, UA NEGATIVE  NEGATIVE  URINE MICROSCOPIC-ADD ON     Status: Abnormal   Collection Time    03/13/14  8:01 PM      Result Value Ref Range   Squamous Epithelial / LPF FEW (*) RARE   WBC, UA 0-2  <3 WBC/hpf   RBC / HPF 21-50  <3 RBC/hpf   Bacteria, UA FEW (*) RARE    Urine-Other MUCOUS PRESENT    CBC     Status: None   Collection Time    03/13/14  8:55 PM      Result Value Ref Range   WBC 8.5  4.0 - 10.5 K/uL   RBC 4.00  3.87 - 5.11 MIL/uL   Hemoglobin 12.3  12.0 - 15.0 g/dL   HCT 16.137.5  09.636.0 - 04.546.0 %   MCV 93.8  78.0 - 100.0 fL   MCH 30.8  26.0 - 34.0 pg   MCHC 32.8  30.0 - 36.0 g/dL   RDW 40.913.3  81.111.5 - 91.415.5 %   Platelets 169  150 - 400 K/uL  HCG, QUANTITATIVE, PREGNANCY     Status: Abnormal   Collection Time    03/13/14  8:55 PM      Result Value Ref Range   hCG, Beta Chain, Quant, S 117 (*) <5 mIU/mL  WET PREP, GENITAL     Status: Abnormal   Collection Time    03/13/14  9:00 PM      Result Value Ref Range   Yeast Wet Prep HPF POC NONE SEEN  NONE SEEN   Trich, Wet Prep NONE SEEN  NONE SEEN   Clue Cells Wet Prep HPF POC FEW (*) NONE SEEN   WBC, Wet Prep HPF POC RARE (*) NONE SEEN   [place] Ob Transvaginal  03/13/2014   CLINICAL DATA:  Pain, bleeding.  EXAM: TRANSVAGINAL OB ULTRASOUND  TECHNIQUE: Transvaginal ultrasound was performed for complete evaluation of the gestation as well as the maternal uterus, adnexal regions, and pelvic cul-de-sac.  COMPARISON:  Pelvic ultrasound 01/28/2014  FINDINGS: Intrauterine gestational sac: Suggestion of persistent irregular appearing intrauterine gestational sac with internal echogenicity/debris.  Yolk sac:  Not present  Embryo:  Not present  Cardiac Activity: Not present  MSD: 29 mm   8 w   1  d  Maternal uterus/adnexae: The right ovary is not visualized. The left ovary is normal in appearance.  IMPRESSION: Patient had intrauterine fetal demise 01/28/2014. Given the lack of intervention, this is favored to represent retained products of conception. Given that the probable persistent irregular gestational sac measures 29 mm, and there is no yolk sac or embryo, findings are compatible with failed pregnancy.  These results were called by telephone at the time of interpretation on 03/13/2014 at 11:17 PM to Dr. Mathews RobinsonsHogan, who  verbally acknowledged these results.   Electronically Signed   By: Annia Beltrew  Davis M.D.   On: 03/13/2014 23:20   2327: D/W Dr. Macon LargeAnyanwu, patient may have  Misoprostol.  Discussed with the patient the R/B/A of misoprostol v continued expectant management at this time. She is agreeable to proceeding with misoprostol for the retained products of  conception.    Assessment and Plan   1. Retained products of conception following abortion    Cytotec given here in MAU RX for vicodin Phenergan and ibuprofen Return to MAU as needed Bleeding precautions  Follow-up Information   [facility] (They will call you with an appoitnment )    Specialty:  Obstetrics and Gynecology   Contact information:   [address] [place] [address] [phone]       Tawnya CrookHogan, Lazlo Tunney Donovan 03/13/2014, 11:23 PM

## 2014-03-13 NOTE — Discharge Instructions (Signed)
Miscarriage A miscarriage is the sudden loss of an unborn baby (fetus) before the 20th week of pregnancy. Most miscarriages happen in the first 3 months of pregnancy. Sometimes, it happens before a woman even knows she is pregnant. A miscarriage is also called a "spontaneous miscarriage" or "early pregnancy loss." Having a miscarriage can be an emotional experience. Talk with your caregiver about any questions you may have about miscarrying, the grieving process, and your future pregnancy plans. CAUSES   Problems with the fetal chromosomes that make it impossible for the baby to develop normally. Problems with the baby's genes or chromosomes are most often the result of errors that occur, by chance, as the embryo divides and grows. The problems are not inherited from the parents.  Infection of the cervix or uterus.   Hormone problems.   Problems with the cervix, such as having an incompetent cervix. This is when the tissue in the cervix is not strong enough to hold the pregnancy.   Problems with the uterus, such as an abnormally shaped uterus, uterine fibroids, or congenital abnormalities.   Certain medical conditions.   Smoking, drinking alcohol, or taking illegal drugs.   Trauma.  Often, the cause of a miscarriage is unknown.  SYMPTOMS   Vaginal bleeding or spotting, with or without cramps or pain.  Pain or cramping in the abdomen or lower back.  Passing fluid, tissue, or blood clots from the vagina. DIAGNOSIS  Your caregiver will perform a physical exam. You may also have an ultrasound to confirm the miscarriage. Blood or urine tests may also be ordered. TREATMENT   Sometimes, treatment is not necessary if you naturally pass all the fetal tissue that was in the uterus. If some of the fetus or placenta remains in the body (incomplete miscarriage), tissue left behind may become infected and must be removed. Usually, a dilation and curettage (D and C) procedure is performed.  During a D and C procedure, the cervix is widened (dilated) and any remaining fetal or placental tissue is gently removed from the uterus.  Antibiotic medicines are prescribed if there is an infection. Other medicines may be given to reduce the size of the uterus (contract) if there is a lot of bleeding.  If you have Rh negative blood and your baby was Rh positive, you will need a Rh immunoglobulin shot. This shot will protect any future baby from having Rh blood problems in future pregnancies. HOME CARE INSTRUCTIONS   Your caregiver may order bed rest or may allow you to continue light activity. Resume activity as directed by your caregiver.  Have someone help with home and family responsibilities during this time.   Keep track of the number of sanitary pads you use each day and how soaked (saturated) they are. Write down this information.   Do not use tampons. Do not douche or have sexual intercourse until approved by your caregiver.   Only take over-the-counter or prescription medicines for pain or discomfort as directed by your caregiver.   Do not take aspirin. Aspirin can cause bleeding.   Keep all follow-up appointments with your caregiver.   If you or your partner have problems with grieving, talk to your caregiver or seek counseling to help cope with the pregnancy loss. Allow enough time to grieve before trying to get pregnant again.  SEEK IMMEDIATE MEDICAL CARE IF:   You have severe cramps or pain in your back or abdomen.  You have a fever.  You pass large blood clots (walnut-sized   or larger) ortissue from your vagina. Save any tissue for your caregiver to inspect.   Your bleeding increases.   You have a thick, bad-smelling vaginal discharge.  You become lightheaded, weak, or you faint.   You have chills.  MAKE SURE YOU:  Understand these instructions.  Will watch your condition.  Will get help right away if you are not doing well or get  worse. Document Released: 02/27/2001 Document Revised: 12/29/2012 Document Reviewed: 10/23/2011 Exit[employer] Patient Information 2015 [employer]. This information is not intended to replace advice given to you by your health care provider. Make sure you discuss any questions you have with your health care provider.  

## 2014-03-13 NOTE — MAU Note (Signed)
Pt reports she was seen here 05/15 and was told she was having a SAB and was set up for a D&C but she missed the appointment. States she has started having pain and bleeding. Dysuria.

## 2014-03-15 LAB — GC/CHLAMYDIA PROBE AMP
CT Probe RNA: NEGATIVE
GC Probe RNA: NEGATIVE

## 2014-03-24 ENCOUNTER — Ambulatory Visit: Payer: Medicaid Other | Admitting: Obstetrics and Gynecology

## 2014-03-24 ENCOUNTER — Encounter: Payer: Self-pay | Admitting: *Deleted

## 2014-03-24 ENCOUNTER — Telehealth: Payer: Self-pay | Admitting: *Deleted

## 2014-03-24 NOTE — Telephone Encounter (Signed)
Lauren Pineda missed a scheduled appointment for follow up of miscarriage/retained products of conception- discuss possble D&C.  Called and left message she had missed a scheduled appointmetn and it was important to reschedule- please call office to reschedule. Also sent letter.

## 2014-04-21 ENCOUNTER — Encounter (HOSPITAL_COMMUNITY): Payer: Self-pay | Admitting: Emergency Medicine

## 2014-04-21 ENCOUNTER — Emergency Department (HOSPITAL_COMMUNITY): Payer: Medicaid Other

## 2014-04-21 ENCOUNTER — Emergency Department (HOSPITAL_COMMUNITY)
Admission: EM | Admit: 2014-04-21 | Discharge: 2014-04-21 | Disposition: A | Discharge status: Home / Self Care | Payer: Medicaid Other | Attending: Emergency Medicine | Admitting: Emergency Medicine

## 2014-04-21 DIAGNOSIS — R1084 Generalized abdominal pain: Secondary | ICD-10-CM | POA: Insufficient documentation

## 2014-04-21 DIAGNOSIS — F17200 Nicotine dependence, unspecified, uncomplicated: Secondary | ICD-10-CM | POA: Diagnosis not present

## 2014-04-21 DIAGNOSIS — Z91040 Latex allergy status: Secondary | ICD-10-CM | POA: Insufficient documentation

## 2014-04-21 DIAGNOSIS — M549 Dorsalgia, unspecified: Secondary | ICD-10-CM | POA: Diagnosis not present

## 2014-04-21 DIAGNOSIS — O021 Missed abortion: Secondary | ICD-10-CM

## 2014-04-21 LAB — URINALYSIS, ROUTINE W REFLEX MICROSCOPIC
Bilirubin Urine: NEGATIVE
GLUCOSE, UA: NEGATIVE mg/dL
Ketones, ur: NEGATIVE mg/dL
Nitrite: NEGATIVE
PROTEIN: NEGATIVE mg/dL
Specific Gravity, Urine: 1.006 (ref 1.005–1.030)
Urobilinogen, UA: 0.2 mg/dL (ref 0.0–1.0)
pH: 6 (ref 5.0–8.0)

## 2014-04-21 LAB — COMPREHENSIVE METABOLIC PANEL
ALBUMIN: 3.8 g/dL (ref 3.5–5.2)
ALT: 8 U/L (ref 0–35)
AST: 12 U/L (ref 0–37)
Alkaline Phosphatase: 87 U/L (ref 39–117)
Anion gap: 13 (ref 5–15)
BUN: 6 mg/dL (ref 6–23)
CO2: 23 meq/L (ref 19–32)
CREATININE: 0.65 mg/dL (ref 0.50–1.10)
Calcium: 9.1 mg/dL (ref 8.4–10.5)
Chloride: 102 mEq/L (ref 96–112)
GFR calc Af Amer: >90 mL/min (ref >90)
Glucose, Bld: 88 mg/dL (ref 70–99)
Potassium: 4.4 mEq/L (ref 3.7–5.3)
Sodium: 138 mEq/L (ref 137–147)
TOTAL PROTEIN: 7.4 g/dL (ref 6.0–8.3)
Total Bilirubin: 0.4 mg/dL (ref 0.3–1.2)

## 2014-04-21 LAB — CBC WITH DIFFERENTIAL/PLATELET
BASOS ABS: 0 10*3/uL (ref 0.0–0.1)
BASOS PCT: 0 % (ref 0–1)
EOS PCT: 2 % (ref 0–5)
Eosinophils Absolute: 0.2 10*3/uL (ref 0.0–0.7)
HEMATOCRIT: 41.9 % (ref 36.0–46.0)
Hemoglobin: 13.5 g/dL (ref 12.0–15.0)
Lymphocytes Relative: 11 % — ABNORMAL LOW (ref 12–46)
Lymphs Abs: 1.4 10*3/uL (ref 0.7–4.0)
MCH: 30.1 pg (ref 26.0–34.0)
MCHC: 32.2 g/dL (ref 30.0–36.0)
MCV: 93.5 fL (ref 78.0–100.0)
Monocytes Absolute: 0.5 10*3/uL (ref 0.1–1.0)
Monocytes Relative: 4 % (ref 3–12)
Neutro Abs: 11.4 10*3/uL — ABNORMAL HIGH (ref 1.7–7.7)
Neutrophils Relative %: 83 % — ABNORMAL HIGH (ref 43–77)
Platelets: 294 10*3/uL (ref 150–400)
RBC: 4.48 MIL/uL (ref 3.87–5.11)
RDW: 13.3 % (ref 11.5–15.5)
WBC: 13.5 10*3/uL — ABNORMAL HIGH (ref 4.0–10.5)

## 2014-04-21 LAB — WET PREP, GENITAL
Clue Cells Wet Prep HPF POC: NONE SEEN
Trich, Wet Prep: NONE SEEN
Yeast Wet Prep HPF POC: NONE SEEN

## 2014-04-21 LAB — URINE MICROSCOPIC-ADD ON

## 2014-04-21 LAB — PREGNANCY, URINE: Preg Test, Ur: NEGATIVE

## 2014-04-21 LAB — LIPASE, BLOOD: LIPASE: 26 U/L (ref 11–59)

## 2014-04-21 MED ORDER — OXYCODONE-ACETAMINOPHEN 5-325 MG PO TABS: 1.0000 | ORAL_TABLET | Freq: Four times a day (QID) | ORAL | Status: DC | PRN

## 2014-04-21 MED ORDER — HYDROMORPHONE HCL PF 1 MG/ML IJ SOLN
1.0000 mg | Freq: Once | INTRAMUSCULAR | Status: AC
  Administered 2014-04-21: 1 mg via INTRAVENOUS
  Filled 2014-04-21: qty 1

## 2014-04-21 MED ORDER — ONDANSETRON HCL 4 MG PO TABS: 4.0000 mg | ORAL_TABLET | Freq: Four times a day (QID) | ORAL | Status: DC

## 2014-04-21 MED ORDER — MORPHINE SULFATE 4 MG/ML IJ SOLN
4.0000 mg | Freq: Once | INTRAMUSCULAR | Status: AC
  Administered 2014-04-21: 4 mg via INTRAVENOUS
  Filled 2014-04-21: qty 1

## 2014-04-21 MED ORDER — MISOPROSTOL 200 MCG PO TABS
800.0000 ug | ORAL_TABLET | Freq: Once | ORAL | Status: AC
  Administered 2014-04-21: 800 ug via VAGINAL
  Filled 2014-04-21: qty 4

## 2014-04-21 MED ORDER — ONDANSETRON HCL 4 MG/2ML IJ SOLN
4.0000 mg | Freq: Once | INTRAMUSCULAR | Status: AC
  Administered 2014-04-21: 4 mg via INTRAVENOUS
  Filled 2014-04-21: qty 2

## 2014-04-21 MED ORDER — SODIUM CHLORIDE 0.9 % IV BOLUS (SEPSIS)
1000.0000 mL | Freq: Once | INTRAVENOUS | Status: AC
  Administered 2014-04-21: 1000 mL via INTRAVENOUS

## 2014-04-21 NOTE — ED Notes (Signed)
Pt still out of the department at this time for a procedure

## 2014-04-21 NOTE — ED Notes (Signed)
Pt ambulated well to the bathroom without any difficulty or distress; pt provided an urine specimen

## 2014-04-21 NOTE — ED Notes (Signed)
Provider at the bedside.  

## 2014-04-21 NOTE — Progress Notes (Signed)
  CARE MANAGEMENT ED NOTE 04/21/2014  Patient:  Lauren Pineda,Lauren Pineda   Account Number:  [id number]  Date Initiated:  04/21/2014  Documentation initiated by:  Ferdinand CavaSCHETTINO,Teffany Blaszczyk  Subjective/Objective Assessment:   28 yo female presenting to the ED with nausea     Subjective/Objective Assessment Detail:     Action/Plan:   Patient is encouraged to follow up with resources to establish a PCP   Action/Plan Detail:   Anticipated DC Date:       Status Recommendation to Physician:   Result of Recommendation:  Agreed    DC Planning Services  CM consult  PCP issues  Other    Choice offered to / List presented to:  C-1 Patient          Status of service:  Completed, signed off  ED Comments:   ED Comments Detail:  CM consulted to assist with PCP. Patient confirmed that she does not have a PCP. This CM provided the patient with a [place] of [place] [place] resources and provided the patient with a pamphlet for [facility]. Encoiuraged the patient to follow up with resources for a PCP and the orange card. This CM will provide the information to the [facility] liaison to follow up with the patient for the orange card. Also encouraged the patient to follow [facility]. The patient stated that [facility] [place] [place]. This CM encouraged the patient to follow [facility] at [facility] to establish care and be eligible for their resources. She verbalized understanding and appreciaition for services and had no other questions or concerns.

## 2014-04-21 NOTE — ED Provider Notes (Signed)
CSN: [id number]     Arrival date & time 04/21/14  0706 History   First MD Initiated Contact with Patient 04/21/14 0740     Chief Complaint  Patient presents with  . Abdominal Pain  . Back Pain     (Consider location/radiation/quality/duration/timing/severity/associated sxs/prior Treatment) Patient is a 28 y.o. female presenting with abdominal pain.  Abdominal Pain Pain location:  Generalized Pain quality: sharp   Pain radiates to:  Does not radiate Pain severity:  Severe Onset quality:  Sudden Duration:  5 hours (similar pain yesterday morning, lasted about 10 minutes) Timing:  Constant Progression:  Worsening Chronicity:  Recurrent Context comment:  Recent miscarriage with subsequent retained products.  missed follow up appointments prior to complete treatment Relieved by:  Nothing Worsened by:  Movement Associated symptoms: nausea and vaginal discharge   Associated symptoms: no diarrhea     Past Medical History  Diagnosis Date  . Medical history non-contributory    Past Surgical History  Procedure Laterality Date  . Cesarean section    . Cesarean section N/A 11/23/2012    Procedure: CESAREAN SECTION;  Surgeon: Kathreen Cosier, MD;  Location: [facility];  Service: Obstetrics;  Laterality: N/A;  Repeat Cesarean Section Delivery Baby    @      , Apgars    Family History  Problem Relation Age of Onset  . Asthma Father   . Asthma Brother    History  Substance Use Topics  . Smoking status: Current Some Day Smoker  . Smokeless tobacco: Not on file  . Alcohol Use: No   OB History   Grav Para Term Preterm Abortions TAB SAB Ect Mult Living   7 4 2 2 2  2   4      Review of Systems  Gastrointestinal: Positive for nausea and abdominal pain. Negative for diarrhea.  Genitourinary: Positive for vaginal discharge.  All other systems reviewed and are negative.     Allergies  Latex  Home Medications   Prior to Admission medications   Not on File   BP 133/85  Pulse  90  Temp(Src) 98.6 F (37 C) (Oral)  Resp 18  Ht 5\' 4"  (1.626 m)  Wt 183 lb (83.008 kg)  BMI 31.40 kg/m2  SpO2 99%  LMP 11/14/2013  Breastfeeding? Unknown Physical Exam  Nursing note and vitals reviewed. Constitutional: She is oriented to person, place, and time. She appears well-developed and well-nourished. No distress.  Tearful  HENT:  Head: Normocephalic and atraumatic.  Mouth/Throat: Oropharynx is clear and moist.  Eyes: Conjunctivae are normal. Pupils are equal, round, and reactive to light. No scleral icterus.  Neck: Neck supple.  Cardiovascular: Normal rate, regular rhythm, normal heart sounds and intact distal pulses.   No murmur heard. Pulmonary/Chest: Effort normal and breath sounds normal. No stridor. No respiratory distress. She has no rales.  Abdominal: Soft. Bowel sounds are normal. She exhibits no distension. There is generalized tenderness. There is guarding. There is no rigidity.  Genitourinary: Uterus is tender. Cervix exhibits motion tenderness and discharge. Right adnexum displays tenderness. Left adnexum displays tenderness.  Pelvic exam limited by patient's pain  Musculoskeletal: Normal range of motion.  Neurological: She is alert and oriented to person, place, and time.  Skin: Skin is warm and dry. No rash noted.  Psychiatric: She has a normal mood and affect. Her behavior is normal.    ED Course  Procedures (including critical care time) Labs Review Labs Reviewed  WET PREP, GENITAL - Abnormal; Notable for  the following:    WBC, Wet Prep HPF POC TOO NUMEROUS TO COUNT (*)    All other components within normal limits  CBC WITH DIFFERENTIAL - Abnormal; Notable for the following:    WBC 13.5 (*)    Neutrophils Relative % 83 (*)    Neutro Abs 11.4 (*)    Lymphocytes Relative 11 (*)    All other components within normal limits  URINALYSIS, ROUTINE W REFLEX MICROSCOPIC - Abnormal; Notable for the following:    Color, Urine STRAW (*)    Hgb urine dipstick  LARGE (*)    Leukocytes, UA MODERATE (*)    All other components within normal limits  GC/CHLAMYDIA PROBE AMP  COMPREHENSIVE METABOLIC PANEL  LIPASE, BLOOD  URINE MICROSCOPIC-ADD ON  PREGNANCY, URINE    Imaging Review [place] Transvaginal Non-ob  04/21/2014   CLINICAL DATA:  Pelvic pain, back pain.  Recent miscarriage.  EXAM: TRANSABDOMINAL AND TRANSVAGINAL ULTRASOUND OF PELVIS  DOPPLER ULTRASOUND OF OVARIES  TECHNIQUE: Both transabdominal and transvaginal ultrasound examinations of the pelvis were performed. Transabdominal technique was performed for global imaging of the pelvis including uterus, ovaries, adnexal regions, and pelvic cul-de-sac.  It was necessary to proceed with endovaginal exam following the transabdominal exam to visualize the ovaries. Color and duplex Doppler ultrasound was utilized to evaluate blood flow to the ovaries.  COMPARISON:  03/13/2014  FINDINGS: Uterus  Measurements: 10.7 x 3.9 x 4.9 cm. No fibroids or other mass visualized.  Endometrium  Thickness: 18 mm in the fundus, thickened with heterogeneous tissue in the mid to lower uterus measuring up to 2.8 cm. Findings concerning for retained products of conception.  Right ovary  Measurements: 4.3 x 2.6 x 3.3 cm. Normal appearance/no adnexal mass.  Left ovary  Measurements: 3.3 x 2.5 x 2.6 cm. Normal appearance/no adnexal mass.  Pulsed Doppler evaluation of both ovaries demonstrates normal low-resistance arterial and venous waveforms.  Other findings  Small amount of free fluid in the pelvis.  IMPRESSION: Heterogeneous material within the endometrium with thickened endometrium. Findings concerning for retained products of conception.   Electronically Signed   By: Charlett NoseKevin  Dover M.D.   On: 04/21/2014 12:03   [place] Pelvis Complete  04/21/2014   CLINICAL DATA:  Pelvic pain, back pain.  Recent miscarriage.  EXAM: TRANSABDOMINAL AND TRANSVAGINAL ULTRASOUND OF PELVIS  DOPPLER ULTRASOUND OF OVARIES  TECHNIQUE: Both transabdominal and  transvaginal ultrasound examinations of the pelvis were performed. Transabdominal technique was performed for global imaging of the pelvis including uterus, ovaries, adnexal regions, and pelvic cul-de-sac.  It was necessary to proceed with endovaginal exam following the transabdominal exam to visualize the ovaries. Color and duplex Doppler ultrasound was utilized to evaluate blood flow to the ovaries.  COMPARISON:  03/13/2014  FINDINGS: Uterus  Measurements: 10.7 x 3.9 x 4.9 cm. No fibroids or other mass visualized.  Endometrium  Thickness: 18 mm in the fundus, thickened with heterogeneous tissue in the mid to lower uterus measuring up to 2.8 cm. Findings concerning for retained products of conception.  Right ovary  Measurements: 4.3 x 2.6 x 3.3 cm. Normal appearance/no adnexal mass.  Left ovary  Measurements: 3.3 x 2.5 x 2.6 cm. Normal appearance/no adnexal mass.  Pulsed Doppler evaluation of both ovaries demonstrates normal low-resistance arterial and venous waveforms.  Other findings  Small amount of free fluid in the pelvis.  IMPRESSION: Heterogeneous material within the endometrium with thickened endometrium. Findings concerning for retained products of conception.   Electronically Signed   By: Caryn BeeKevin  Dover M.D.   On: 04/21/2014 12:03   [place] Art/ven Flow Abd Pelv Doppler  04/21/2014   CLINICAL DATA:  Pelvic pain, back pain.  Recent miscarriage.  EXAM: TRANSABDOMINAL AND TRANSVAGINAL ULTRASOUND OF PELVIS  DOPPLER ULTRASOUND OF OVARIES  TECHNIQUE: Both transabdominal and transvaginal ultrasound examinations of the pelvis were performed. Transabdominal technique was performed for global imaging of the pelvis including uterus, ovaries, adnexal regions, and pelvic cul-de-sac.  It was necessary to proceed with endovaginal exam following the transabdominal exam to visualize the ovaries. Color and duplex Doppler ultrasound was utilized to evaluate blood flow to the ovaries.  COMPARISON:  03/13/2014  FINDINGS: Uterus   Measurements: 10.7 x 3.9 x 4.9 cm. No fibroids or other mass visualized.  Endometrium  Thickness: 18 mm in the fundus, thickened with heterogeneous tissue in the mid to lower uterus measuring up to 2.8 cm. Findings concerning for retained products of conception.  Right ovary  Measurements: 4.3 x 2.6 x 3.3 cm. Normal appearance/no adnexal mass.  Left ovary  Measurements: 3.3 x 2.5 x 2.6 cm. Normal appearance/no adnexal mass.  Pulsed Doppler evaluation of both ovaries demonstrates normal low-resistance arterial and venous waveforms.  Other findings  Small amount of free fluid in the pelvis.  IMPRESSION: Heterogeneous material within the endometrium with thickened endometrium. Findings concerning for retained products of conception.   Electronically Signed   By: Charlett Nose M.D.   On: 04/21/2014 12:03  All radiology studies independently viewed by me.      EKG Interpretation None      MDM   Final diagnoses:  Retained products of conception, not following spontaneous or induced abortion or delivery    28 yo female with recent incomplete miscarriage who has missed follow up appointments with OB who comes to ED today with lower abdominal pain.  US obtained and shows persistent retained products.  I discussed her case with Dr. Gaynell Face [facility]) who recommended cytotec 800mg  vaginally and outpatient follow up next Tuesday.  She reports that she cannot follow up due to lack of funds.  Social work to evaluate.    Case management saw her and helped arrange outpatient resources.  Cytotec ordered.  I emphasized the importance of follow up.    Candyce Churn III, MD 04/21/14 [phone]

## 2014-04-21 NOTE — ED Notes (Signed)
Pt reports that she started having lower abd pain and back pain a couple of days ago. States that she also noticed yesterday that she had blood clot in her urine. States that she had a miscarriage back in June and is unsure if this is related.

## 2014-04-22 LAB — GC/CHLAMYDIA PROBE AMP
CT Probe RNA: NEGATIVE
GC Probe RNA: NEGATIVE

## 2014-04-27 LAB — PROCEDURE REPORT - SCANNED: Pap: NEGATIVE

## 2014-04-30 ENCOUNTER — Encounter: Payer: Self-pay | Admitting: General Practice

## 2014-04-30 ENCOUNTER — Inpatient Hospital Stay (HOSPITAL_COMMUNITY): Payer: Medicaid Other | Admitting: Anesthesiology

## 2014-04-30 ENCOUNTER — Encounter (HOSPITAL_COMMUNITY): Payer: Medicaid Other | Admitting: Anesthesiology

## 2014-04-30 ENCOUNTER — Emergency Department (HOSPITAL_COMMUNITY): Payer: Medicaid Other

## 2014-04-30 ENCOUNTER — Encounter (HOSPITAL_COMMUNITY)
Admission: EM | Disposition: A | Discharge status: Home / Self Care | Payer: Self-pay | Source: Home / Self Care | Attending: Emergency Medicine

## 2014-04-30 ENCOUNTER — Ambulatory Visit (HOSPITAL_COMMUNITY)
Admission: EM | Admit: 2014-04-30 | Discharge: 2014-04-30 | Disposition: A | Discharge status: Home / Self Care | Payer: Medicaid Other | Attending: Obstetrics | Admitting: Obstetrics

## 2014-04-30 ENCOUNTER — Encounter (HOSPITAL_COMMUNITY): Payer: Self-pay | Admitting: Emergency Medicine

## 2014-04-30 DIAGNOSIS — O030 Genital tract and pelvic infection following incomplete spontaneous abortion: Secondary | ICD-10-CM | POA: Insufficient documentation

## 2014-04-30 DIAGNOSIS — R1030 Lower abdominal pain, unspecified: Secondary | ICD-10-CM

## 2014-04-30 DIAGNOSIS — D72829 Elevated white blood cell count, unspecified: Secondary | ICD-10-CM

## 2014-04-30 DIAGNOSIS — F17200 Nicotine dependence, unspecified, uncomplicated: Secondary | ICD-10-CM | POA: Insufficient documentation

## 2014-04-30 DIAGNOSIS — O034 Incomplete spontaneous abortion without complication: Secondary | ICD-10-CM

## 2014-04-30 HISTORY — PX: DILATION AND EVACUATION: SHX1459

## 2014-04-30 LAB — CBC WITH DIFFERENTIAL/PLATELET
BASOS PCT: 0 % (ref 0–1)
Basophils Absolute: 0 10*3/uL (ref 0.0–0.1)
Eosinophils Absolute: 0.3 10*3/uL (ref 0.0–0.7)
Eosinophils Relative: 2 % (ref 0–5)
HCT: 36.1 % (ref 36.0–46.0)
HEMOGLOBIN: 11.8 g/dL — AB (ref 12.0–15.0)
LYMPHS PCT: 17 % (ref 12–46)
Lymphs Abs: 2.7 10*3/uL (ref 0.7–4.0)
MCH: 30.2 pg (ref 26.0–34.0)
MCHC: 32.7 g/dL (ref 30.0–36.0)
MCV: 92.3 fL (ref 78.0–100.0)
MONOS PCT: 4 % (ref 3–12)
Monocytes Absolute: 0.6 10*3/uL (ref 0.1–1.0)
NEUTROS ABS: 12.4 10*3/uL — AB (ref 1.7–7.7)
NEUTROS PCT: 77 % (ref 43–77)
Platelets: 362 10*3/uL (ref 150–400)
RBC: 3.91 MIL/uL (ref 3.87–5.11)
RDW: 13.3 % (ref 11.5–15.5)
WBC: 15.9 10*3/uL — AB (ref 4.0–10.5)

## 2014-04-30 LAB — BASIC METABOLIC PANEL
ANION GAP: 14 (ref 5–15)
BUN: 10 mg/dL (ref 6–23)
CHLORIDE: 103 meq/L (ref 96–112)
CO2: 23 mEq/L (ref 19–32)
Calcium: 8.7 mg/dL (ref 8.4–10.5)
Creatinine, Ser: 0.66 mg/dL (ref 0.50–1.10)
GFR calc non Af Amer: >90 mL/min (ref >90)
Glucose, Bld: 97 mg/dL (ref 70–99)
POTASSIUM: 4 meq/L (ref 3.7–5.3)
Sodium: 140 mEq/L (ref 137–147)

## 2014-04-30 LAB — URINALYSIS, ROUTINE W REFLEX MICROSCOPIC
BILIRUBIN URINE: NEGATIVE
Glucose, UA: NEGATIVE mg/dL
KETONES UR: NEGATIVE mg/dL
Nitrite: NEGATIVE
PROTEIN: NEGATIVE mg/dL
Specific Gravity, Urine: 1.028 (ref 1.005–1.030)
UROBILINOGEN UA: 0.2 mg/dL (ref 0.0–1.0)
pH: 6 (ref 5.0–8.0)

## 2014-04-30 LAB — URINE MICROSCOPIC-ADD ON

## 2014-04-30 LAB — I-STAT CG4 LACTIC ACID, ED: Lactic Acid, Venous: 1.42 mmol/L (ref 0.5–2.2)

## 2014-04-30 LAB — POC URINE PREG, ED: Preg Test, Ur: NEGATIVE

## 2014-04-30 SURGERY — DILATION AND EVACUATION, UTERUS
Anesthesia: Monitor Anesthesia Care | Site: Uterus

## 2014-04-30 MED ORDER — HYDROMORPHONE HCL PF 1 MG/ML IJ SOLN
1.0000 mg | Freq: Once | INTRAMUSCULAR | Status: AC
Start: 2014-04-30 — End: 2014-04-30
  Administered 2014-04-30: 1 mg via INTRAVENOUS
  Filled 2014-04-30: qty 1

## 2014-04-30 MED ORDER — FAMOTIDINE IN NACL 20-0.9 MG/50ML-% IV SOLN
20.0000 mg | Freq: Once | INTRAVENOUS | Status: AC
  Administered 2014-04-30: 20 mg via INTRAVENOUS
  Filled 2014-04-30: qty 50

## 2014-04-30 MED ORDER — PROPOFOL 10 MG/ML IV EMUL
INTRAVENOUS | Status: DC | PRN
  Administered 2014-04-30: 70 mg via INTRAVENOUS
  Administered 2014-04-30: 30 mg via INTRAVENOUS

## 2014-04-30 MED ORDER — CITRIC ACID-SODIUM CITRATE 334-500 MG/5ML PO SOLN
30.0000 mL | Freq: Once | ORAL | Status: DC
  Filled 2014-04-30: qty 15

## 2014-04-30 MED ORDER — FENTANYL CITRATE 0.05 MG/ML IJ SOLN
25.0000 ug | INTRAMUSCULAR | Status: DC | PRN
Start: 2014-04-30 — End: 2014-04-30
  Administered 2014-04-30: 25 ug via INTRAVENOUS
  Administered 2014-04-30: 50 ug via INTRAVENOUS
  Administered 2014-04-30: 25 ug via INTRAVENOUS

## 2014-04-30 MED ORDER — PROPOFOL INFUSION 10 MG/ML OPTIME
INTRAVENOUS | Status: DC | PRN
  Administered 2014-04-30: 100 ug/kg/min via INTRAVENOUS

## 2014-04-30 MED ORDER — FENTANYL CITRATE 0.05 MG/ML IJ SOLN
INTRAMUSCULAR | Status: AC
  Administered 2014-04-30: 25 ug via INTRAVENOUS
  Filled 2014-04-30: qty 2

## 2014-04-30 MED ORDER — CLINDAMYCIN PHOSPHATE 900 MG/50ML IV SOLN
900.0000 mg | Freq: Once | INTRAVENOUS | Status: AC
  Administered 2014-04-30: 900 mg via INTRAVENOUS
  Filled 2014-04-30: qty 50

## 2014-04-30 MED ORDER — LIDOCAINE HCL 1 % IJ SOLN
INTRAMUSCULAR | Status: AC
  Filled 2014-04-30: qty 20

## 2014-04-30 MED ORDER — MIDAZOLAM HCL 2 MG/2ML IJ SOLN
INTRAMUSCULAR | Status: AC
  Filled 2014-04-30: qty 2

## 2014-04-30 MED ORDER — IOHEXOL 300 MG/ML  SOLN
80.0000 mL | Freq: Once | INTRAMUSCULAR | Status: AC | PRN
  Administered 2014-04-30: 80 mL via INTRAVENOUS

## 2014-04-30 MED ORDER — DEXAMETHASONE SODIUM PHOSPHATE 10 MG/ML IJ SOLN
INTRAMUSCULAR | Status: DC | PRN
  Administered 2014-04-30: 10 mg via INTRAVENOUS

## 2014-04-30 MED ORDER — KETOROLAC TROMETHAMINE 30 MG/ML IJ SOLN
INTRAMUSCULAR | Status: AC
  Filled 2014-04-30: qty 1

## 2014-04-30 MED ORDER — LACTATED RINGERS IV SOLN
INTRAVENOUS | Status: DC | PRN
  Administered 2014-04-30: 12:00:00 via INTRAVENOUS

## 2014-04-30 MED ORDER — METOCLOPRAMIDE HCL 5 MG/ML IJ SOLN
INTRAMUSCULAR | Status: AC
  Administered 2014-04-30: 10 mg via INTRAVENOUS
  Filled 2014-04-30: qty 2

## 2014-04-30 MED ORDER — METOCLOPRAMIDE HCL 5 MG/ML IJ SOLN
10.0000 mg | Freq: Once | INTRAMUSCULAR | Status: AC | PRN
  Administered 2014-04-30: 10 mg via INTRAVENOUS

## 2014-04-30 MED ORDER — KETOROLAC TROMETHAMINE 60 MG/2ML IM SOLN
60.0000 mg | Freq: Once | INTRAMUSCULAR | Status: DC
  Filled 2014-04-30: qty 2

## 2014-04-30 MED ORDER — KETOROLAC TROMETHAMINE 30 MG/ML IJ SOLN
30.0000 mg | Freq: Once | INTRAMUSCULAR | Status: AC
  Administered 2014-04-30: 30 mg via INTRAVENOUS

## 2014-04-30 MED ORDER — KETOROLAC TROMETHAMINE 30 MG/ML IJ SOLN
30.0000 mg | Freq: Once | INTRAMUSCULAR | Status: AC
  Administered 2014-04-30: 30 mg via INTRAVENOUS
  Filled 2014-04-30: qty 1

## 2014-04-30 MED ORDER — MEPERIDINE HCL 25 MG/ML IJ SOLN: 6.2500 mg | INTRAMUSCULAR | Status: DC | PRN

## 2014-04-30 MED ORDER — FENTANYL CITRATE 0.05 MG/ML IJ SOLN
INTRAMUSCULAR | Status: DC | PRN
  Administered 2014-04-30 (×2): 50 ug via INTRAVENOUS

## 2014-04-30 MED ORDER — 0.9 % SODIUM CHLORIDE (POUR BTL) OPTIME
TOPICAL | Status: DC | PRN
  Administered 2014-04-30: 1000 mL

## 2014-04-30 MED ORDER — MORPHINE SULFATE 4 MG/ML IJ SOLN
4.0000 mg | Freq: Once | INTRAMUSCULAR | Status: AC
  Administered 2014-04-30: 4 mg via INTRAVENOUS
  Filled 2014-04-30: qty 1

## 2014-04-30 MED ORDER — ONDANSETRON HCL 4 MG/2ML IJ SOLN
4.0000 mg | Freq: Once | INTRAMUSCULAR | Status: AC
  Administered 2014-04-30: 4 mg via INTRAVENOUS
  Filled 2014-04-30: qty 2

## 2014-04-30 MED ORDER — FENTANYL CITRATE 0.05 MG/ML IJ SOLN
50.0000 ug | Freq: Once | INTRAMUSCULAR | Status: AC
  Administered 2014-04-30: 50 ug via INTRAVENOUS
  Filled 2014-04-30: qty 2

## 2014-04-30 MED ORDER — LIDOCAINE HCL 1 % IJ SOLN
INTRAMUSCULAR | Status: DC | PRN
  Administered 2014-04-30: 9 mL

## 2014-04-30 MED ORDER — SODIUM CHLORIDE 0.9 % IV BOLUS (SEPSIS)
1000.0000 mL | Freq: Once | INTRAVENOUS | Status: AC
  Administered 2014-04-30: 1000 mL via INTRAVENOUS

## 2014-04-30 MED ORDER — MIDAZOLAM HCL 2 MG/2ML IJ SOLN
INTRAMUSCULAR | Status: DC | PRN
  Administered 2014-04-30: 2 mg via INTRAVENOUS

## 2014-04-30 MED ORDER — FENTANYL CITRATE 0.05 MG/ML IJ SOLN
INTRAMUSCULAR | Status: AC
  Filled 2014-04-30: qty 2

## 2014-04-30 MED ORDER — GENTAMICIN SULFATE 40 MG/ML IJ SOLN
5.0000 mg/kg | Freq: Once | INTRAVENOUS | Status: AC
  Administered 2014-04-30: 420 mg via INTRAVENOUS
  Filled 2014-04-30: qty 10.5

## 2014-04-30 MED ORDER — ONDANSETRON HCL 4 MG/2ML IJ SOLN
INTRAMUSCULAR | Status: DC | PRN
  Administered 2014-04-30: 4 mg via INTRAVENOUS

## 2014-04-30 MED ORDER — IOHEXOL 300 MG/ML  SOLN
25.0000 mL | Freq: Once | INTRAMUSCULAR | Status: AC | PRN
  Administered 2014-04-30: 25 mL via ORAL

## 2014-04-30 SURGICAL SUPPLY — 17 items
CATH ROBINSON RED A/P 16FR (CATHETERS) ×3 IMPLANT
CLOTH BEACON ORANGE TIMEOUT ST (SAFETY) ×3 IMPLANT
DECANTER SPIKE VIAL GLASS SM (MISCELLANEOUS) ×3 IMPLANT
GLOVE BIO SURGEON STRL SZ8.5 (GLOVE) ×3 IMPLANT
GOWN STRL REUS W/TWL 2XL LVL3 (GOWN DISPOSABLE) ×3 IMPLANT
GOWN STRL REUS W/TWL LRG LVL3 (GOWN DISPOSABLE) ×3 IMPLANT
KIT BERKELEY 1ST TRIMESTER 3/8 (MISCELLANEOUS) ×3 IMPLANT
NS IRRIG 1000ML POUR BTL (IV SOLUTION) ×3 IMPLANT
PACK VAGINAL MINOR WOMEN LF (CUSTOM PROCEDURE TRAY) ×3 IMPLANT
PAD OB MATERNITY 4.3X12.25 (PERSONAL CARE ITEMS) ×3 IMPLANT
PAD PREP 24X48 CUFFED NSTRL (MISCELLANEOUS) ×3 IMPLANT
SET BERKELEY SUCTION TUBING (SUCTIONS) ×3 IMPLANT
TOWEL OR 17X24 6PK STRL BLUE (TOWEL DISPOSABLE) ×6 IMPLANT
VACURETTE 10 RIGID CVD (CANNULA) IMPLANT
VACURETTE 7MM CVD STRL WRAP (CANNULA) IMPLANT
VACURETTE 8 RIGID CVD (CANNULA) IMPLANT
VACURETTE 9 RIGID CVD (CANNULA) IMPLANT

## 2014-04-30 NOTE — ED Provider Notes (Signed)
CSN: [id number]     Arrival date & time 04/30/14  0055 History   First MD Initiated Contact with Patient 04/30/14 0254     Chief Complaint  Patient presents with  . Abdominal Pain   HPI  History provided by the patient in recent medical chart. Patient is a 28 year old female with history of recent miscarriage and retained products of conception presenting with return of worsened lower abdominal pain and discomfort. Patient states that she has had significant severe pains to her lower abdomen. She was seen in the emergency department and given several pills for retained products of conception. Patient states she did feel slightly better for 2 days. She has continued to have vaginal bleeding since this time. She also followed up with an OB/GYN specialist and she states they told her it would still take one to 2 more months before her bleeding might stop. The patient however began to have much more severe pain over the past few days. She complains of pain with any movements or bumps even while driving. She reports subjective fevers and chills. Denies any nausea or vomiting. No diarrhea or constipation.    Past Medical History  Diagnosis Date  . Medical history non-contributory    Past Surgical History  Procedure Laterality Date  . Cesarean section    . Cesarean section N/A 11/23/2012    Procedure: CESAREAN SECTION;  Surgeon: Kathreen Cosier, MD;  Location: [facility];  Service: Obstetrics;  Laterality: N/A;  Repeat Cesarean Section Delivery Baby    @      , Apgars    Family History  Problem Relation Age of Onset  . Asthma Father   . Asthma Brother    History  Substance Use Topics  . Smoking status: Current Some Day Smoker  . Smokeless tobacco: Not on file  . Alcohol Use: No   OB History   Grav Para Term Preterm Abortions TAB SAB Ect Mult Living   7 4 2 2 2  2   4      Review of Systems  Constitutional: Positive for fever and chills.  Gastrointestinal: Positive for abdominal pain.  Negative for vomiting and diarrhea.  Genitourinary: Positive for vaginal bleeding and pelvic pain. Negative for vaginal discharge.  All other systems reviewed and are negative.     Allergies  Latex  Home Medications   Prior to Admission medications   Medication Sig Start Date End Date Taking? Authorizing Provider  ondansetron (ZOFRAN) 4 MG tablet Take 4 mg by mouth every 6 (six) hours as needed for nausea or vomiting.   Yes Historical Provider, MD  oxyCODONE-acetaminophen (PERCOCET/ROXICET) 5-325 MG per tablet Take 1-2 tablets by mouth every 6 (six) hours as needed for moderate pain or severe pain.   Yes Historical Provider, MD   BP 111/62  Pulse 82  Temp(Src) 98.4 F (36.9 C) (Oral)  Resp 20  SpO2 100%  LMP 11/14/2013 Physical Exam  Nursing note and vitals reviewed. Constitutional: She is oriented to person, place, and time. She appears well-developed and well-nourished. No distress.  HENT:  Head: Normocephalic.  Cardiovascular: Normal rate and regular rhythm.   Pulmonary/Chest: Effort normal and breath sounds normal. No respiratory distress.  Abdominal: Soft. Bowel sounds are normal. She exhibits no distension. There is tenderness. There is rebound and guarding.  Musculoskeletal: Normal range of motion.  Neurological: She is alert and oriented to person, place, and time.  Skin: Skin is warm and dry. No rash noted.  Psychiatric: She has a  normal mood and affect. Her behavior is normal.    ED Course  Procedures   COORDINATION OF CARE:  Nursing notes reviewed. Vital signs reviewed. Initial pt interview and examination performed.   Filed Vitals:   04/30/14 0117 04/30/14 0309 04/30/14 0317  BP: 110/71  111/62  Pulse: 74  82  Temp: 98.4 F (36.9 C)  98.4 F (36.9 C)  TempSrc: Oral  Oral  Resp: 18  20  SpO2: 97% 98% 100%    3:30 AM-patient seen and evaluated. Patient is very cheerful during abdominal exam even to very light palpation. Patient is afebrile with  normal heart rate. She does not appear severely ill or toxic.  6:00AM Spoke with Radiologist. There is inflammation in the RLQ and adnexa area. The appendix looks enlarged but inflammation appears more focal to ovary area. He recommends pelvic US to evaluate further.  Pt discussed in sign out with Ebbie Ridgehris Lawyer PA-C. He will follow US results.   Treatment plan initiated: Medications  morphine 4 MG/ML injection 4 mg (4 mg Intravenous Given 04/30/14 0351)  iohexol (OMNIPAQUE) 300 MG/ML solution 25 mL (25 mLs Oral Contrast Given 04/30/14 0345)    Results for orders placed during the hospital encounter of 04/30/14  URINALYSIS, ROUTINE W REFLEX MICROSCOPIC      Result Value Ref Range   Color, Urine AMBER (*) YELLOW   APPearance CLOUDY (*) CLEAR   Specific Gravity, Urine 1.028  1.005 - 1.030   pH 6.0  5.0 - 8.0   Glucose, UA NEGATIVE  NEGATIVE mg/dL   Hgb urine dipstick LARGE (*) NEGATIVE   Bilirubin Urine NEGATIVE  NEGATIVE   Ketones, ur NEGATIVE  NEGATIVE mg/dL   Protein, ur NEGATIVE  NEGATIVE mg/dL   Urobilinogen, UA 0.2  0.0 - 1.0 mg/dL   Nitrite NEGATIVE  NEGATIVE   Leukocytes, UA MODERATE (*) NEGATIVE  CBC WITH DIFFERENTIAL      Result Value Ref Range   WBC 15.9 (*) 4.0 - 10.5 K/uL   RBC 3.91  3.87 - 5.11 MIL/uL   Hemoglobin 11.8 (*) 12.0 - 15.0 g/dL   HCT 16.136.1  09.636.0 - 04.546.0 %   MCV 92.3  78.0 - 100.0 fL   MCH 30.2  26.0 - 34.0 pg   MCHC 32.7  30.0 - 36.0 g/dL   RDW 40.913.3  81.111.5 - 91.415.5 %   Platelets 362  150 - 400 K/uL   Neutrophils Relative % 77  43 - 77 %   Neutro Abs 12.4 (*) 1.7 - 7.7 K/uL   Lymphocytes Relative 17  12 - 46 %   Lymphs Abs 2.7  0.7 - 4.0 K/uL   Monocytes Relative 4  3 - 12 %   Monocytes Absolute 0.6  0.1 - 1.0 K/uL   Eosinophils Relative 2  0 - 5 %   Eosinophils Absolute 0.3  0.0 - 0.7 K/uL   Basophils Relative 0  0 - 1 %   Basophils Absolute 0.0  0.0 - 0.1 K/uL  BASIC METABOLIC PANEL      Result Value Ref Range   Sodium 140  137 - 147 mEq/L    Potassium 4.0  3.7 - 5.3 mEq/L   Chloride 103  96 - 112 mEq/L   CO2 23  19 - 32 mEq/L   Glucose, Bld 97  70 - 99 mg/dL   BUN 10  6 - 23 mg/dL   Creatinine, Ser 7.820.66  0.50 - 1.10 mg/dL   Calcium 8.7  8.4 -  10.5 mg/dL   GFR calc non Af Amer >90  >90 mL/min   GFR calc Af Amer >90  >90 mL/min   Anion gap 14  5 - 15  URINE MICROSCOPIC-ADD ON      Result Value Ref Range   Squamous Epithelial / LPF FEW (*) RARE   WBC, UA 11-20  <3 WBC/hpf   RBC / HPF 11-20  <3 RBC/hpf   Bacteria, UA FEW (*) RARE   Urine-Other MUCOUS PRESENT    POC URINE PREG, ED      Result Value Ref Range   Preg Test, Ur NEGATIVE  NEGATIVE  I-STAT CG4 LACTIC ACID, ED      Result Value Ref Range   Lactic Acid, Venous 1.42  0.5 - 2.2 mmol/L      Imaging Review Ct Abdomen Pelvis W Contrast  04/30/2014   CLINICAL DATA:  Lower abdominal pain.  Nausea and vomiting.  EXAM: CT ABDOMEN AND PELVIS WITH CONTRAST  TECHNIQUE: Multidetector CT imaging of the abdomen and pelvis was performed using the standard protocol following bolus administration of intravenous contrast.  CONTRAST:  80mL OMNIPAQUE IOHEXOL 300 MG/ML  SOLN  COMPARISON:  Pelvic ultrasound performed 04/21/2014  FINDINGS: The visualized lung bases are clear.  There is minimal nonspecific prominence of the intrahepatic biliary ducts. The liver is otherwise unremarkable. The common hepatic duct remains normal in size. The gallbladder is within normal limits. The pancreas and adrenal glands are unremarkable.  The kidneys are unremarkable in appearance. There is no evidence of hydronephrosis. No renal or ureteral stones are seen. No perinephric stranding is appreciated.  No free fluid is identified. The small bowel is unremarkable in appearance. The stomach is within normal limits. No acute vascular abnormalities are seen.  The appendix is distended, measuring up to 9 mm in maximal diameter, with mild wall thickening. Though this could reflect appendicitis, it could also reflect  an underlying ovarian process, as soft tissue inflammation appears centered about the right ovary.  The right ovary appears asymmetrically enlarged, with overlying soft tissue inflammation tracking about the omentum. More mild soft tissue inflammation is seen extending to the left side of the pelvis. Trace free fluid within the pelvis is of increased attenuation and may reflect serosanguineous fluid.  The colon is partially filled with fluid and air, and is unremarkable in appearance.  The bladder is mildly distended and grossly unremarkable. A 3.9 cm focus of decreased attenuation at the lower uterine segment may reflect focal clot, given the appearance of the prior ultrasound. The uterus is otherwise grossly unremarkable. The left ovary is within normal limits. No inguinal lymphadenopathy is seen.  No acute osseous abnormalities are identified.  IMPRESSION: 1. Soft tissue inflammation within the pelvis, more prominent on the right. This appears centered about the right ovary, with asymmetric enlargement of the right ovary. Would consider pelvic ultrasound for further evaluation. 2. Appendix distended to 9 mm in maximal diameter, with mild wall thickening. Though this could reflect appendicitis, it could also reflect the underlying ovarian process, as soft tissue inflammation does not appear directly related to the appendix. 3. 3.9 cm focus of decreased attenuation at the lower uterine segment may reflect focal clot, given prior ultrasound. 4. Trace free fluid within the pelvis, of increased attenuation; this may reflect serosanguineous fluid. 5. Minimal nonspecific prominence of the intrahepatic biliary ducts, without evidence of distal obstruction.  These results were called by telephone at the time of interpretation on 04/30/2014 at 6:09 am to [facility]  Malahki Gasaway PA, who verbally acknowledged these results.   Electronically Signed   By: Roanna Raider M.D.   On: 04/30/2014 06:09     MDM   Final diagnoses:  None         Angus Seller, PA-C 04/30/14 [phone]

## 2014-04-30 NOTE — ED Notes (Addendum)
Presents with lower abdominal pain has been off and on since May after a miscarriage. She was seen here last Sunday and "they told me I retained parts from the miscarriage and they gave me 4 pills but they did not do anything. I have been bleeding since May and when they did what they did Sunday I have had a bad smell and the blood i sblack" C/o lower abdominal pain-everything makes pain worse.

## 2014-04-30 NOTE — MAU Note (Signed)
Pain has increased and odor has increased since last Cytotec ( 04-23-14  Last Friday);  Seen Dr Gaynell FaceMArshall this past Tuesday:

## 2014-04-30 NOTE — MAU Provider Note (Signed)
This is Dr. Francoise CeoBernard Aurelie Dicenzo dictating the history and physical on  Lauren Pineda  she's a 28 year old gravida 7 para [id number] who had early pregnancy loss in June Cytotec in [facility] she passed tissue and on August 5 she was having some bleeding and cramping and and from Aurea GraffJoan went to: Emergency room ultrasound showed retained tissue she got Cytotec 800 in in and was seen by me on 811 [address] not having any tissue loss and she was not cramping and she was started on birth control pills on Sunday coming she returned in the emergency room this morning with pain and cramping which started yesterday she denies any heavy bleeding and her white count was 16 ultrasound showed retained tissue so patient is here for a D&E today she is on clindamycin and gentamicin IV past medical history negative Past surgical history negative Social history negative System review noncontributory she's not allergic to any medication Physical exam well-developed female in no distress HEENT negative Lungs P&A Heart regular rhythm no murmurs no gallops Breasts negative Abdomen negative Pelvic uterus top normal size and tender adnexa tender and her extremities negative and

## 2014-04-30 NOTE — MAU Note (Signed)
[facility]; miscarried in May; CT scan today showed retained POC; Pain is 10; IV of NSS in progress;

## 2014-04-30 NOTE — ED Notes (Signed)
Varney Bilesalled Peter, PA-C, to report that patient is complaining of pain 10/10, and that morphine did not help at all the last administration. Theron Aristaeter acknowledges, and will enter orders.

## 2014-04-30 NOTE — ED Provider Notes (Signed)
Medical screening examination/treatment/procedure(s) were performed by non-physician practitioner and as supervising physician I was immediately available for consultation/collaboration.   EKG Interpretation None       Olivia Mackielga M Hazen Brumett, MD 04/30/14 [phone]

## 2014-04-30 NOTE — ED Provider Notes (Signed)
8:15 AM Patient with ongoing pain and discomfort despite Cytotec last week.  She states since then she's had developing worsening lower abdominal pain and subjective fever and chills.  She states that she has had ongoing bleeding and clot that smells like "death".  Concerning for septic retained products.  Patient be started on clindamycin and gentamicin at this time.  I discussed her case with Dr. Gaynell Face who accepts the patient in transfer to the maternity assessment unit.  He suspects he will plan on D&C later today.  Patient will be kept n.p.o.  Patient stable for transfer at this time. Pt updated  WBC  Date Value Ref Range Status  04/30/2014 15.9* 4.0 - 10.5 K/uL Final  04/21/2014 13.5* 4.0 - 10.5 K/uL Final  03/13/2014 8.5  4.0 - 10.5 K/uL Final  01/27/2014 8.3  4.0 - 10.5 K/uL Final    US Transvaginal Non-ob  04/30/2014   CLINICAL DATA:  Pelvic pain with abnormalities in the right aspect of the pelvis on CT scan of today's date; history of miscarriage in June of 2013 with bleeding intermittently since.  EXAM: TRANSABDOMINAL AND TRANSVAGINAL ULTRASOUND OF PELVIS  DOPPLER ULTRASOUND OF OVARIES  TECHNIQUE: Both transabdominal and transvaginal ultrasound examinations of the pelvis were performed. Transabdominal technique was performed for global imaging of the pelvis including uterus, ovaries, adnexal regions, and pelvic cul-de-sac.  It was necessary to proceed with endovaginal exam following the transabdominal exam to visualize the endometrium and adnexal structures. Color and duplex Doppler ultrasound was utilized to evaluate blood flow to the ovaries.  COMPARISON:  Pelvic CT scan of today's date and pelvic ultrasound April 21, 2014 and March 13, 2014.  FINDINGS: Uterus  Measurements: 10.8 x 5.0 x 5.4 cm. No fibroids or other mass visualized.  Endometrium  Thickness: 5.4 mm. There is a slightly hyperechoic structure in the lower uterine segment measuring 2.7 x 2 x 3 cm. There is small amount of  surrounding decreased echogenicity suggesting fluid. No increased vascularity is demonstrated. It is more organized in appearance than on the previous ultrasound and it is consistent with the CT appearance today.  Right ovary  Measurements: 4.9 x 2.2 x 3.7 cm. There is a hypoechoic nonvascular structure associated with the right ovary measuring 2.7 x 1.8 x 2.4 cm. Its echotexture is fairly homogeneous. It is similar in appearance to the earlier ultrasound findings.  Left ovary  Measurements: 3.7 x 2.6 x 2.1 cm. Normal appearance/no adnexal mass.  Pulsed Doppler evaluation of both ovaries demonstrates normal low-resistance arterial and venous waveforms.  Other findings  There is a small amount of free pelvic fluid.  IMPRESSION: 1. Within the lower uterine segment there is a homogeneous slightly hyperechoic structure which may reflect retracting clot with a small amount of surrounding serum. No abnormal Doppler signal is demonstrated. This is better organized than on the previous ultrasound.Reportedly the patient has had vaginal bleeding since a miscarriage in June of 2015. The possibility of retained products conception is still raised. 2. Within the right ovary a non hypervascular slightly hypoechoic structure is demonstrated. It is nonspecific but given its CT appearance it could reflect a hemorrhagic cyst. Its ultrasound appearance is not classic for tubo-ovarian abscess but this is not entirely excluded. 3. The left ovary is normal in appearance. 4. There is a small amount of free pelvic fluid. 5. Correlation with a serum beta HCG is needed. Gynecological evaluation and consideration for possible D+C is suggested.   Electronically Signed   By: Onalee Hua  [place]   On: 04/30/2014 08:05   [place] Transvaginal Non-ob  04/21/2014   CLINICAL DATA:  Pelvic pain, back pain.  Recent miscarriage.  EXAM: TRANSABDOMINAL AND TRANSVAGINAL ULTRASOUND OF PELVIS  DOPPLER ULTRASOUND OF OVARIES  TECHNIQUE: Both transabdominal and  transvaginal ultrasound examinations of the pelvis were performed. Transabdominal technique was performed for global imaging of the pelvis including uterus, ovaries, adnexal regions, and pelvic cul-de-sac.  It was necessary to proceed with endovaginal exam following the transabdominal exam to visualize the ovaries. Color and duplex Doppler ultrasound was utilized to evaluate blood flow to the ovaries.  COMPARISON:  03/13/2014  FINDINGS: Uterus  Measurements: 10.7 x 3.9 x 4.9 cm. No fibroids or other mass visualized.  Endometrium  Thickness: 18 mm in the fundus, thickened with heterogeneous tissue in the mid to lower uterus measuring up to 2.8 cm. Findings concerning for retained products of conception.  Right ovary  Measurements: 4.3 x 2.6 x 3.3 cm. Normal appearance/no adnexal mass.  Left ovary  Measurements: 3.3 x 2.5 x 2.6 cm. Normal appearance/no adnexal mass.  Pulsed Doppler evaluation of both ovaries demonstrates normal low-resistance arterial and venous waveforms.  Other findings  Small amount of free fluid in the pelvis.  IMPRESSION: Heterogeneous material within the endometrium with thickened endometrium. Findings concerning for retained products of conception.   Electronically Signed   By: Charlett NoseKevin  Dover M.D.   On: 04/21/2014 12:03   [place] Pelvis Complete  04/30/2014   CLINICAL DATA:  Pelvic pain with abnormalities in the right aspect of the pelvis on CT scan of today's date; history of miscarriage in June of 2013 with bleeding intermittently since.  EXAM: TRANSABDOMINAL AND TRANSVAGINAL ULTRASOUND OF PELVIS  DOPPLER ULTRASOUND OF OVARIES  TECHNIQUE: Both transabdominal and transvaginal ultrasound examinations of the pelvis were performed. Transabdominal technique was performed for global imaging of the pelvis including uterus, ovaries, adnexal regions, and pelvic cul-de-sac.  It was necessary to proceed with endovaginal exam following the transabdominal exam to visualize the endometrium and adnexal  structures. Color and duplex Doppler ultrasound was utilized to evaluate blood flow to the ovaries.  COMPARISON:  Pelvic CT scan of today's date and pelvic ultrasound April 21, 2014 and March 13, 2014.  FINDINGS: Uterus  Measurements: 10.8 x 5.0 x 5.4 cm. No fibroids or other mass visualized.  Endometrium  Thickness: 5.4 mm. There is a slightly hyperechoic structure in the lower uterine segment measuring 2.7 x 2 x 3 cm. There is small amount of surrounding decreased echogenicity suggesting fluid. No increased vascularity is demonstrated. It is more organized in appearance than on the previous ultrasound and it is consistent with the CT appearance today.  Right ovary  Measurements: 4.9 x 2.2 x 3.7 cm. There is a hypoechoic nonvascular structure associated with the right ovary measuring 2.7 x 1.8 x 2.4 cm. Its echotexture is fairly homogeneous. It is similar in appearance to the earlier ultrasound findings.  Left ovary  Measurements: 3.7 x 2.6 x 2.1 cm. Normal appearance/no adnexal mass.  Pulsed Doppler evaluation of both ovaries demonstrates normal low-resistance arterial and venous waveforms.  Other findings  There is a small amount of free pelvic fluid.  IMPRESSION: 1. Within the lower uterine segment there is a homogeneous slightly hyperechoic structure which may reflect retracting clot with a small amount of surrounding serum. No abnormal Doppler signal is demonstrated. This is better organized than on the previous ultrasound.Reportedly the patient has had vaginal bleeding since a miscarriage in June of 2015. The possibility of  retained products conception is still raised. 2. Within the right ovary a non hypervascular slightly hypoechoic structure is demonstrated. It is nonspecific but given its CT appearance it could reflect a hemorrhagic cyst. Its ultrasound appearance is not classic for tubo-ovarian abscess but this is not entirely excluded. 3. The left ovary is normal in appearance. 4. There is a small amount  of free pelvic fluid. 5. Correlation with a serum beta HCG is needed. Gynecological evaluation and consideration for possible D+C is suggested.   Electronically Signed   By: David  [place]   On: 04/30/2014 08:05   US Pelvis Complete  04/21/2014   CLINICAL DATA:  Pelvic pain, back pain.  Recent miscarriage.  EXAM: TRANSABDOMINAL AND TRANSVAGINAL ULTRASOUND OF PELVIS  DOPPLER ULTRASOUND OF OVARIES  TECHNIQUE: Both transabdominal and transvaginal ultrasound examinations of the pelvis were performed. Transabdominal technique was performed for global imaging of the pelvis including uterus, ovaries, adnexal regions, and pelvic cul-de-sac.  It was necessary to proceed with endovaginal exam following the transabdominal exam to visualize the ovaries. Color and duplex Doppler ultrasound was utilized to evaluate blood flow to the ovaries.  COMPARISON:  03/13/2014  FINDINGS: Uterus  Measurements: 10.7 x 3.9 x 4.9 cm. No fibroids or other mass visualized.  Endometrium  Thickness: 18 mm in the fundus, thickened with heterogeneous tissue in the mid to lower uterus measuring up to 2.8 cm. Findings concerning for retained products of conception.  Right ovary  Measurements: 4.3 x 2.6 x 3.3 cm. Normal appearance/no adnexal mass.  Left ovary  Measurements: 3.3 x 2.5 x 2.6 cm. Normal appearance/no adnexal mass.  Pulsed Doppler evaluation of both ovaries demonstrates normal low-resistance arterial and venous waveforms.  Other findings  Small amount of free fluid in the pelvis.  IMPRESSION: Heterogeneous material within the endometrium with thickened endometrium. Findings concerning for retained products of conception.   Electronically Signed   By: Charlett Nose M.D.   On: 04/21/2014 12:03   Ct Abdomen Pelvis W Contrast  04/30/2014   CLINICAL DATA:  Lower abdominal pain.  Nausea and vomiting.  EXAM: CT ABDOMEN AND PELVIS WITH CONTRAST  TECHNIQUE: Multidetector CT imaging of the abdomen and pelvis was performed using the standard  protocol following bolus administration of intravenous contrast.  CONTRAST:  80mL OMNIPAQUE IOHEXOL 300 MG/ML  SOLN  COMPARISON:  Pelvic ultrasound performed 04/21/2014  FINDINGS: The visualized lung bases are clear.  There is minimal nonspecific prominence of the intrahepatic biliary ducts. The liver is otherwise unremarkable. The common hepatic duct remains normal in size. The gallbladder is within normal limits. The pancreas and adrenal glands are unremarkable.  The kidneys are unremarkable in appearance. There is no evidence of hydronephrosis. No renal or ureteral stones are seen. No perinephric stranding is appreciated.  No free fluid is identified. The small bowel is unremarkable in appearance. The stomach is within normal limits. No acute vascular abnormalities are seen.  The appendix is distended, measuring up to 9 mm in maximal diameter, with mild wall thickening. Though this could reflect appendicitis, it could also reflect an underlying ovarian process, as soft tissue inflammation appears centered about the right ovary.  The right ovary appears asymmetrically enlarged, with overlying soft tissue inflammation tracking about the omentum. More mild soft tissue inflammation is seen extending to the left side of the pelvis. Trace free fluid within the pelvis is of increased attenuation and may reflect serosanguineous fluid.  The colon is partially filled with fluid and air, and is unremarkable in appearance.  The bladder is mildly distended and grossly unremarkable. A 3.9 cm focus of decreased attenuation at the lower uterine segment may reflect focal clot, given the appearance of the prior ultrasound. The uterus is otherwise grossly unremarkable. The left ovary is within normal limits. No inguinal lymphadenopathy is seen.  No acute osseous abnormalities are identified.  IMPRESSION: 1. Soft tissue inflammation within the pelvis, more prominent on the right. This appears centered about the right ovary, with  asymmetric enlargement of the right ovary. Would consider pelvic ultrasound for further evaluation. 2. Appendix distended to 9 mm in maximal diameter, with mild wall thickening. Though this could reflect appendicitis, it could also reflect the underlying ovarian process, as soft tissue inflammation does not appear directly related to the appendix. 3. 3.9 cm focus of decreased attenuation at the lower uterine segment may reflect focal clot, given prior ultrasound. 4. Trace free fluid within the pelvis, of increased attenuation; this may reflect serosanguineous fluid. 5. Minimal nonspecific prominence of the intrahepatic biliary ducts, without evidence of distal obstruction.  These results were called by telephone at the time of interpretation on 04/30/2014 at 6:09 am to [facility], who verbally acknowledged these results.   Electronically Signed   By: Roanna Raider M.D.   On: 04/30/2014 06:09   [place] Art/ven Flow Abd Pelv Doppler  04/30/2014   CLINICAL DATA:  Pelvic pain with abnormalities in the right aspect of the pelvis on CT scan of today's date; history of miscarriage in June of 2013 with bleeding intermittently since.  EXAM: TRANSABDOMINAL AND TRANSVAGINAL ULTRASOUND OF PELVIS  DOPPLER ULTRASOUND OF OVARIES  TECHNIQUE: Both transabdominal and transvaginal ultrasound examinations of the pelvis were performed. Transabdominal technique was performed for global imaging of the pelvis including uterus, ovaries, adnexal regions, and pelvic cul-de-sac.  It was necessary to proceed with endovaginal exam following the transabdominal exam to visualize the endometrium and adnexal structures. Color and duplex Doppler ultrasound was utilized to evaluate blood flow to the ovaries.  COMPARISON:  Pelvic CT scan of today's date and pelvic ultrasound April 21, 2014 and March 13, 2014.  FINDINGS: Uterus  Measurements: 10.8 x 5.0 x 5.4 cm. No fibroids or other mass visualized.  Endometrium  Thickness: 5.4 mm. There is a  slightly hyperechoic structure in the lower uterine segment measuring 2.7 x 2 x 3 cm. There is small amount of surrounding decreased echogenicity suggesting fluid. No increased vascularity is demonstrated. It is more organized in appearance than on the previous ultrasound and it is consistent with the CT appearance today.  Right ovary  Measurements: 4.9 x 2.2 x 3.7 cm. There is a hypoechoic nonvascular structure associated with the right ovary measuring 2.7 x 1.8 x 2.4 cm. Its echotexture is fairly homogeneous. It is similar in appearance to the earlier ultrasound findings.  Left ovary  Measurements: 3.7 x 2.6 x 2.1 cm. Normal appearance/no adnexal mass.  Pulsed Doppler evaluation of both ovaries demonstrates normal low-resistance arterial and venous waveforms.  Other findings  There is a small amount of free pelvic fluid.  IMPRESSION: 1. Within the lower uterine segment there is a homogeneous slightly hyperechoic structure which may reflect retracting clot with a small amount of surrounding serum. No abnormal Doppler signal is demonstrated. This is better organized than on the previous ultrasound.Reportedly the patient has had vaginal bleeding since a miscarriage in June of 2015. The possibility of retained products conception is still raised. 2. Within the right ovary a non hypervascular slightly hypoechoic structure is demonstrated.  It is nonspecific but given its CT appearance it could reflect a hemorrhagic cyst. Its ultrasound appearance is not classic for tubo-ovarian abscess but this is not entirely excluded. 3. The left ovary is normal in appearance. 4. There is a small amount of free pelvic fluid. 5. Correlation with a serum beta HCG is needed. Gynecological evaluation and consideration for possible D+C is suggested.   Electronically Signed   By: David  [place]   On: 04/30/2014 08:05   [place] Art/ven Flow Abd Pelv Doppler  04/21/2014   CLINICAL DATA:  Pelvic pain, back pain.  Recent miscarriage.  EXAM:  TRANSABDOMINAL AND TRANSVAGINAL ULTRASOUND OF PELVIS  DOPPLER ULTRASOUND OF OVARIES  TECHNIQUE: Both transabdominal and transvaginal ultrasound examinations of the pelvis were performed. Transabdominal technique was performed for global imaging of the pelvis including uterus, ovaries, adnexal regions, and pelvic cul-de-sac.  It was necessary to proceed with endovaginal exam following the transabdominal exam to visualize the ovaries. Color and duplex Doppler ultrasound was utilized to evaluate blood flow to the ovaries.  COMPARISON:  03/13/2014  FINDINGS: Uterus  Measurements: 10.7 x 3.9 x 4.9 cm. No fibroids or other mass visualized.  Endometrium  Thickness: 18 mm in the fundus, thickened with heterogeneous tissue in the mid to lower uterus measuring up to 2.8 cm. Findings concerning for retained products of conception.  Right ovary  Measurements: 4.3 x 2.6 x 3.3 cm. Normal appearance/no adnexal mass.  Left ovary  Measurements: 3.3 x 2.5 x 2.6 cm. Normal appearance/no adnexal mass.  Pulsed Doppler evaluation of both ovaries demonstrates normal low-resistance arterial and venous waveforms.  Other findings  Small amount of free fluid in the pelvis.  IMPRESSION: Heterogeneous material within the endometrium with thickened endometrium. Findings concerning for retained products of conception.   Electronically Signed   By: Charlett Nose M.D.   On: 04/21/2014 12:03    Lyanne Co, MD 04/30/14 [phone]

## 2014-04-30 NOTE — Addendum Note (Signed)
Addendum created 04/30/14 1440 by Armanda Heritageharlesetta M Anjolina Byrer, CRNA   Modules edited: Charges VN

## 2014-04-30 NOTE — Transfer of Care (Signed)
Immediate Anesthesia Transfer of Care Note  Patient: Lauren Pineda  Procedure(s) Performed: Procedure(s): DILATATION AND EVACUATION (N/A)  Patient Location: PACU  Anesthesia Type:MAC  Level of Consciousness: awake, alert  and oriented  Airway & Oxygen Therapy: Patient Spontanous Breathing and Patient connected to nasal cannula oxygen  Post-op Assessment: Report given to PACU RN and Post -op Vital signs reviewed and stable  Post vital signs: Reviewed and stable  Complications: No apparent anesthesia complications

## 2014-04-30 NOTE — Anesthesia Preprocedure Evaluation (Signed)
Anesthesia Evaluation  Patient identified by MRN, date of birth, ID band Patient awake    Reviewed: Allergy & Precautions, H&P , NPO status , Patient's Chart, lab work & pertinent test results  Airway Mallampati: III TM Distance: >3 FB Neck ROM: Full    Dental no notable dental hx. (+) Teeth Intact   Pulmonary Current Smoker,  breath sounds clear to auscultation  Pulmonary exam normal       Cardiovascular negative cardio ROS  Rhythm:Regular Rate:Normal     Neuro/Psych negative neurological ROS  negative psych ROS   GI/Hepatic negative GI ROS, Neg liver ROS,   Endo/Other  negative endocrine ROS  Renal/GU negative Renal ROS     Musculoskeletal negative musculoskeletal ROS (+)   Abdominal (+) + obese,   Peds  Hematology negative hematology ROS (+)   Anesthesia Other Findings   Reproductive/Obstetrics (+) Pregnancy Missed Ab                           Anesthesia Physical Anesthesia Plan  ASA: II  Anesthesia Plan: MAC   Post-op Pain Management:    Induction: Intravenous  Airway Management Planned: Natural Airway and Simple Face Mask  Additional Equipment:   Intra-op Plan:   Post-operative Plan: Extubation in OR  Informed Consent: I have reviewed the patients History and Physical, chart, labs and discussed the procedure including the risks, benefits and alternatives for the proposed anesthesia with the patient or authorized representative who has indicated his/her understanding and acceptance.   Dental advisory given  Plan Discussed with: CRNA, Anesthesiologist and Surgeon  Anesthesia Plan Comments:         Anesthesia Quick Evaluation

## 2014-04-30 NOTE — Anesthesia Procedure Notes (Signed)
Procedure Name: MAC Date/Time: 04/30/2014 11:36 AM Performed by: Yoniel Arkwright, Jannet AskewHARLESETTA Pineda Pre-anesthesia Checklist: Patient identified, Timeout performed, Emergency Drugs available, Suction available and Patient being monitored Patient Re-evaluated:Patient Re-evaluated prior to inductionOxygen Delivery Method: Nasal cannula Preoxygenation: Pre-oxygenation with 100% oxygen

## 2014-04-30 NOTE — Op Note (Signed)
Preop diagnosis septic incomplete spontaneous abortion Postop diagnosis is same Anesthesia MAC Surgeon Dr. Lewis MoccasinBrad Marshall Procedure using MAC perineum and vagina prepped and draped  Bladder emptied  with a straight catheter bimanual exam revealed uterus top normal size weighted speculum placed the vagina cervix injected with 9 cc of 1% Xylocaine anterior lip of the cervix grasped with tenaculum endometrial cavity sounded 10 cm and the cervix was open and easily admitted a 25 Pratt a #10 suction curette was carefully inserted in the uterus and the uterine contents aspirated and  was foul-smelling and there is a small amount of tissue obtained sharp curettage performed and the cavity was noted to be clean patient tolerated the procedure well taken to recovery room in good condition

## 2014-04-30 NOTE — Discharge Instructions (Signed)
D&C [facility] °Care After °Read the instructions below. Refer to this sheet in the next few weeks. These instructions provide you with general information on caring for yourself after you leave the hospital. Your caregiver may also give you specific instructions.  °D&C or vacuum curettage is a minor operation. A D&C involves the stretching (dilatation) of the cervix and scraping (curettage) of the inside lining of the uterus. A vacuum curettage gently sucks out the lining and tissue in the uterus with a tube. You may have light cramping and bleeding for a couple of days to two weeks after the procedure. This procedure may be done in a hospital, outpatient clinic, or doctor's office. You may be given a drug to make you sleep (general anesthetic) or a drug that numbs the area (local anesthetic) in and around the cervix. °HOME CARE INSTRUCTIONS °· Do not drive for 24 hours.  °· Wait one week before returning to strenuous activities.  °· Take your temperature two times a day for 4 days and write it down. Provide these temperatures to your caregiver if they are abnormal (above 98.6° F or 37.0° C).  °· Avoid long periods of standing, and do no heavy lifting (more than 10 pounds), pushing or pulling.  °· Limit stair climbing to once or twice a day.  °· Take rest periods often.  °· You may resume your usual diet.  °· Drink plenty of fluids (6-8 glasses a day).  °· You should return to your usual bowel function. If constipation should occur, you may:  °· Take a mild laxative with permission from your caregiver.  °· Add fruit and bran to your diet.  °· Drink more fluids. This helps with constipation.  °· Take showers instead of baths until your caregiver gives you permission to take baths.  °· Do not go swimming or use a hot tub until your caregiver gives you permission.  °· Try to have someone with you or available for you the first 24 to 48 hours, especially if you had a general anesthetic.  °· Do not douche, use  tampons, or have intercourse until after your follow-up appointment, or when your caregiver approves.  °· Only take over-the-counter or prescription medicines for pain, discomfort, or fever as directed by your caregiver. Do not take aspirin. It can cause bleeding.  °· If a prescription was given, follow your caregiver's directions. You may be given a medicine that kills germs (antibiotic) to prevent an infection.  °· Keep all your follow-up appointments recommended by your caregiver.  °SEEK MEDICAL CARE IF: °· You have increasing cramps or pain not relieved with medication.  °· You develop belly (abdominal) pain which does not seem to be related to the same area of earlier cramping and pain.  °· You feel dizzy or feel like fainting.  °· You have bad smelling vaginal discharge.  °· You develop a rash.  °· You develop a reaction or allergy to your medication.  °SEEK IMMEDIATE MEDICAL CARE IF: °· Bleeding is heavier than a normal menstrual period.  °· You have an oral temperature above 100.6, not controlled by medicine.  °· You develop chest pain.  °· You develop shortness of breath.  °· You pass out.  °· You develop pain in your shoulder strap area.  °· You develop heavy vaginal bleeding with or without blood clots.  °MAKE SURE YOU:  °· Understand these instructions.  °· Will watch your condition.  °· Will get help right away if you are   not doing well or get worse.  °UPDATED HEALTH PRACTICES °· A PAP smear is done to screen for cervical cancer.  °· The first PAP smear should be done at age 21.  °· Between ages 21 and 29, PAP smears are repeated every 2 years.  °· Beginning at age 30, you are advised to have a PAP smear every 3 years as long as your past 3 PAP smears have been normal.  °· Some women have medical problems that increase the chance of getting cervical cancer. Talk to your caregiver about these problems. It is especially important to talk to your caregiver if a new problem develops soon after your last PAP  smear. In these cases, your caregiver may recommend more frequent screening and Pap smears.  °· The above recommendations are the same for women who have or have not gotten the vaccine for HPV (Human Papillomavirus).  °· If you had a hysterectomy for a problem that was not a cancer or a condition that could lead to cancer, then you no longer need Pap smears.  °· If you are between ages 65 and 70, and you have had normal Pap smears going back 10 years, you no longer need Pap smears.  °· If you have had past treatment for cervical cancer or a condition that could lead to cancer, you need Pap smears and screening for cancer for at least 20 years after your treatment.  °· Continue monthly self-breast examinations. Your caregiver can provide information and instructions for self-breast examination.  °Document Released: 08/31/2000 Document Re-Released: 02/21/2010 °[employer]® Patient Information ©2011 [employer]. °

## 2014-04-30 NOTE — ED Notes (Signed)
Called CT because patient has finished drinking contrast.  CT reports that she has not submitted a pregnancy test yet, and test is pending until that is resulted. Reported to Primary RN.

## 2014-04-30 NOTE — Anesthesia Postprocedure Evaluation (Signed)
  Anesthesia Post-op Note  Patient: Lauren Pineda  Procedure(s) Performed: Procedure(s): DILATATION AND EVACUATION (N/A)  Patient Location: PACU  Anesthesia Type:MAC  Level of Consciousness: awake, alert  and oriented  Airway and Oxygen Therapy: Patient Spontanous Breathing  Post-op Pain: none  Post-op Assessment: Post-op Vital signs reviewed, Patient's Cardiovascular Status Stable, Respiratory Function Stable, Patent Airway, No signs of Nausea or vomiting and Pain level controlled  Post-op Vital Signs: Reviewed and stable  Last Vitals:  Filed Vitals:   04/30/14 1245  BP: 109/66  Pulse: 68  Temp:   Resp: 12    Complications: No apparent anesthesia complications

## 2014-05-03 ENCOUNTER — Telehealth: Payer: Self-pay | Admitting: *Deleted

## 2014-05-03 ENCOUNTER — Encounter (HOSPITAL_COMMUNITY): Payer: Self-pay | Admitting: Obstetrics

## 2014-05-03 ENCOUNTER — Ambulatory Visit: Payer: Medicaid Other | Admitting: Obstetrics and Gynecology

## 2014-05-03 NOTE — Telephone Encounter (Signed)
Attempted to contact patient to come early to her appointment, no answer, mailbox full unable to leave a message.

## 2014-07-19 ENCOUNTER — Encounter (HOSPITAL_COMMUNITY): Payer: Self-pay | Admitting: Obstetrics

## 2014-09-28 ENCOUNTER — Emergency Department (HOSPITAL_COMMUNITY)
Admission: EM | Admit: 2014-09-28 | Discharge: 2014-09-28 | Disposition: A | Discharge status: Home / Self Care | Payer: Self-pay | Attending: Emergency Medicine | Admitting: Emergency Medicine

## 2014-09-28 ENCOUNTER — Emergency Department (HOSPITAL_COMMUNITY): Payer: Medicaid Other

## 2014-09-28 ENCOUNTER — Encounter (HOSPITAL_COMMUNITY): Payer: Self-pay | Admitting: *Deleted

## 2014-09-28 DIAGNOSIS — S060X0A Concussion without loss of consciousness, initial encounter: Secondary | ICD-10-CM | POA: Insufficient documentation

## 2014-09-28 DIAGNOSIS — S0083XA Contusion of other part of head, initial encounter: Secondary | ICD-10-CM | POA: Insufficient documentation

## 2014-09-28 DIAGNOSIS — Y998 Other external cause status: Secondary | ICD-10-CM | POA: Insufficient documentation

## 2014-09-28 DIAGNOSIS — Z8679 Personal history of other diseases of the circulatory system: Secondary | ICD-10-CM | POA: Insufficient documentation

## 2014-09-28 DIAGNOSIS — Z720 Tobacco use: Secondary | ICD-10-CM | POA: Insufficient documentation

## 2014-09-28 DIAGNOSIS — Y9289 Other specified places as the place of occurrence of the external cause: Secondary | ICD-10-CM | POA: Insufficient documentation

## 2014-09-28 DIAGNOSIS — Y9389 Activity, other specified: Secondary | ICD-10-CM | POA: Insufficient documentation

## 2014-09-28 DIAGNOSIS — Z91040 Latex allergy status: Secondary | ICD-10-CM | POA: Insufficient documentation

## 2014-09-28 MED ORDER — DIPHENHYDRAMINE HCL 50 MG/ML IJ SOLN
25.0000 mg | Freq: Once | INTRAMUSCULAR | Status: AC
  Administered 2014-09-28: 25 mg via INTRAVENOUS
  Filled 2014-09-28: qty 1

## 2014-09-28 MED ORDER — METOCLOPRAMIDE HCL 5 MG/ML IJ SOLN
10.0000 mg | Freq: Once | INTRAMUSCULAR | Status: AC
  Administered 2014-09-28: 10 mg via INTRAVENOUS
  Filled 2014-09-28: qty 2

## 2014-09-28 MED ORDER — SODIUM CHLORIDE 0.9 % IV BOLUS (SEPSIS)
1000.0000 mL | Freq: Once | INTRAVENOUS | Status: AC
  Administered 2014-09-28: 1000 mL via INTRAVENOUS

## 2014-09-28 MED ORDER — HYDROCODONE-ACETAMINOPHEN 5-325 MG PO TABS: 1.0000 | ORAL_TABLET | Freq: Four times a day (QID) | ORAL | Status: DC | PRN

## 2014-09-28 NOTE — ED Notes (Signed)
Pt made aware to return if symptoms worsen or if any life threatening symptoms occur.   
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
## 2014-09-28 NOTE — ED Notes (Signed)
Eye dressed with eye shield and gauze per family request.

## 2014-09-28 NOTE — Discharge Instructions (Signed)
Concussion A concussion, or closed-head injury, is a brain injury caused by a direct blow to the head or by a quick and sudden movement (jolt) of the head or neck. Concussions are usually not life-threatening. Even so, the effects of a concussion can be serious. If you have had a concussion before, you are more likely to experience concussion-like symptoms after a direct blow to the head.  CAUSES  Direct blow to the head, such as from running into another player during a soccer game, being hit in a fight, or hitting your head on a hard surface.  A jolt of the head or neck that causes the brain to move back and forth inside the skull, such as in a car crash. SIGNS AND SYMPTOMS The signs of a concussion can be hard to notice. Early on, they may be missed by you, family members, and health care providers. You may look fine but act or feel differently. Symptoms are usually temporary, but they may last for days, weeks, or even longer. Some symptoms may appear right away while others may not show up for hours or days. Every head injury is different. Symptoms include:  Mild to moderate headaches that will not go away.  A feeling of pressure inside your head.  Having more trouble than usual:  Learning or remembering things you have heard.  Answering questions.  Paying attention or concentrating.  Organizing daily tasks.  Making decisions and solving problems.  Slowness in thinking, acting or reacting, speaking, or reading.  Getting lost or being easily confused.  Feeling tired all the time or lacking energy (fatigued).  Feeling drowsy.  Sleep disturbances.  Sleeping more than usual.  Sleeping less than usual.  Trouble falling asleep.  Trouble sleeping (insomnia).  Loss of balance or feeling lightheaded or dizzy.  Nausea or vomiting.  Numbness or tingling.  Increased sensitivity to:  Sounds.  Lights.  Distractions.  Vision problems or eyes that tire  easily.  Diminished sense of taste or smell.  Ringing in the ears.  Mood changes such as feeling sad or anxious.  Becoming easily irritated or angry for little or no reason.  Lack of motivation.  Seeing or hearing things other people do not see or hear (hallucinations). DIAGNOSIS Your health care provider can usually diagnose a concussion based on a description of your injury and symptoms. He or she will ask whether you passed out (lost consciousness) and whether you are having trouble remembering events that happened right before and during your injury. Your evaluation might include:  A brain scan to look for signs of injury to the brain. Even if the test shows no injury, you may still have a concussion.  Blood tests to be sure other problems are not present. TREATMENT  Concussions are usually treated in an emergency department, in urgent care, or at a clinic. You may need to stay in the hospital overnight for further treatment.  Tell your health care provider if you are taking any medicines, including prescription medicines, over-the-counter medicines, and natural remedies. Some medicines, such as blood thinners (anticoagulants) and aspirin, may increase the chance of complications. Also tell your health care provider whether you have had alcohol or are taking illegal drugs. This information may affect treatment.  Your health care provider will send you home with important instructions to follow.  How fast you will recover from a concussion depends on many factors. These factors include how severe your concussion is, what part of your brain was injured, your  may affect treatment.  · Your health care provider will send you home with important instructions to follow.  · How fast you will recover from a concussion depends on many factors. These factors include how severe your concussion is, what part of your brain was injured, your age, and how healthy you were before the concussion.  · Most people with mild injuries recover fully. Recovery can take time. In general, recovery is slower in older persons. Also, persons who have had a concussion in the past or have other medical problems may find that it takes longer to recover from their current injury.  HOME  CARE INSTRUCTIONS  General Instructions  · Carefully follow the directions your health care provider gave you.  · Only take over-the-counter or prescription medicines for pain, discomfort, or fever as directed by your health care provider.  · Take only those medicines that your health care provider has approved.  · Do not drink alcohol until your health care provider says you are well enough to do so. Alcohol and certain other drugs may slow your recovery and can put you at risk of further injury.  · If it is harder than usual to remember things, write them down.  · If you are easily distracted, try to do one thing at a time. For example, do not try to watch TV while fixing dinner.  · Talk with family members or close friends when making important decisions.  · Keep all follow-up appointments. Repeated evaluation of your symptoms is recommended for your recovery.  · Watch your symptoms and tell others to do the same. Complications sometimes occur after a concussion. Older adults with a brain injury may have a higher risk of serious complications, such as a blood clot on the brain.  · Tell your teachers, school nurse, school counselor, coach, athletic trainer, or work manager about your injury, symptoms, and restrictions. Tell them about what you can or cannot do. They should watch for:  · Increased problems with attention or concentration.  · Increased difficulty remembering or learning new information.  · Increased time needed to complete tasks or assignments.  · Increased irritability or decreased ability to cope with stress.  · Increased symptoms.  · Rest. Rest helps the brain to heal. Make sure you:  · Get plenty of sleep at night. Avoid staying up late at night.  · Keep the same bedtime hours on weekends and weekdays.  · Rest during the day. Take daytime naps or rest breaks when you feel tired.  · Limit activities that require a lot of thought or concentration. These include:  · Doing homework or job-related  work.  · Watching TV.  · Working on the computer.  · Avoid any situation where there is potential for another head injury (football, hockey, soccer, basketball, martial arts, downhill snow sports and horseback riding). Your condition will get worse every time you experience a concussion. You should avoid these activities until you are evaluated by the appropriate follow-up health care providers.  Returning To Your Regular Activities  You will need to return to your normal activities slowly, not all at once. You must give your body and brain enough time for recovery.  · Do not return to sports or other athletic activities until your health care provider tells you it is safe to do so.  · Ask your health care provider when you can drive, ride a bicycle, or operate heavy machinery. Your ability to react may be slower after a   belt when riding in a car.  Drinking alcohol only in moderation.  Wearing a helmet when biking, skiing, skateboarding, skating, or doing similar activities.  Avoiding activities that could lead to a second concussion, such as contact or recreational sports, until your health care provider says it is okay.  Taking safety measures in your home.  Remove clutter and tripping hazards from floors and stairways.  Use grab bars in bathrooms and handrails by stairs.  Place non-slip mats on floors and in bathtubs.  Improve lighting in dim areas. SEEK MEDICAL CARE IF:  You have increased problems paying attention or  concentrating.  You have increased difficulty remembering or learning new information.  You need more time to complete tasks or assignments than before.  You have increased irritability or decreased ability to cope with stress.  You have more symptoms than before. Seek medical care if you have any of the following symptoms for more than 2 weeks after your injury:  Lasting (chronic) headaches.  Dizziness or balance problems.  Nausea.  Vision problems.  Increased sensitivity to noise or light.  Depression or mood swings.  Anxiety or irritability.  Memory problems.  Difficulty concentrating or paying attention.  Sleep problems.  Feeling tired all the time. SEEK IMMEDIATE MEDICAL CARE IF:  You have severe or worsening headaches. These may be a sign of a blood clot in the brain.  You have weakness (even if only in one hand, leg, or part of the face).  You have numbness.  You have decreased coordination.  You vomit repeatedly.  You have increased sleepiness.  One pupil is larger than the other.  You have convulsions.  You have slurred speech.  You have increased confusion. This may be a sign of a blood clot in the brain.  You have increased restlessness, agitation, or irritability.  You are unable to recognize people or places.  You have neck pain.  It is difficult to wake you up.  You have unusual behavior changes.  You lose consciousness. MAKE SURE YOU:  Understand these instructions.  Will watch your condition.  Will get help right away if you are not doing well or get worse. Document Released: 11/24/2003 Document Revised: 09/08/2013 Document Reviewed: 03/26/2013 [facility] Patient Information 2015 [place], [place]. This information is not intended to replace advice given to you by your health care provider. Make sure you discuss any questions you have with your health care provider.  Assault, General Assault includes any behavior, whether  intentional or reckless, which results in bodily injury to another person and/or damage to property. Included in this would be any behavior, intentional or reckless, that by its nature would be understood (interpreted) by a reasonable person as intent to harm another person or to damage his/her property. Threats may be oral or written. They may be communicated through regular mail, computer, fax, or phone. These threats may be direct or implied. FORMS OF ASSAULT INCLUDE:  Physically assaulting a person. This includes physical threats to inflict physical harm as well as:  Slapping.  Hitting.  Poking.  Kicking.  Punching.  Pushing.  Arson.  Sabotage.  Equipment vandalism.  Damaging or destroying property.  Throwing or hitting objects.  Displaying a weapon or an object that appears to be a weapon in a threatening manner.  Carrying a firearm of any kind.  Using a weapon to harm someone.  Using greater physical size/strength to intimidate another.  Making intimidating or threatening gestures.  Bullying.  Hazing.  Intimidating, threatening, hostile,  or abusive language directed toward another person.  It communicates the intention to engage in violence against that person. And it leads a reasonable person to expect that violent behavior may occur.  Stalking another person. IF IT HAPPENS AGAIN:  Immediately call for emergency help (911 in [place]  If someone poses clear and immediate danger to you, seek legal authorities to have a protective or restraining order put in place.  Less threatening assaults can at least be reported to authorities. STEPS TO TAKE IF A SEXUAL ASSAULT HAS HAPPENED  Go to an area of safety. This may include a shelter or staying with a friend. Stay away from the area where you have been attacked. A large percentage of sexual assaults are caused by a friend, relative or associate.  If medications were given by your caregiver, take them as  directed for the full length of time prescribed.  Only take over-the-counter or prescription medicines for pain, discomfort, or fever as directed by your caregiver.  If you have come in contact with a sexual disease, find out if you are to be tested again. If your caregiver is concerned about the HIV/AIDS virus, he/she may require you to have continued testing for several months.  For the protection of your privacy, test results can not be given over the phone. Make sure you receive the results of your test. If your test results are not back during your visit, make an appointment with your caregiver to find out the results. Do not assume everything is normal if you have not heard from your caregiver or the medical facility. It is important for you to follow up on all of your test results.  File appropriate papers with authorities. This is important in all assaults, even if it has occurred in a family or by a friend. SEEK MEDICAL CARE IF:  You have new problems because of your injuries.  You have problems that may be because of the medicine you are taking, such as:  Rash.  Itching.  Swelling.  Trouble breathing.  You develop belly (abdominal) pain, feel sick to your stomach (nausea) or are vomiting.  You begin to run a temperature.  You need supportive care or referral to a rape crisis center. These are centers with trained personnel who can help you get through this ordeal. SEEK IMMEDIATE MEDICAL CARE IF:  You are afraid of being threatened, beaten, or abused. In [place]., call 911.  You receive new injuries related to abuse.  You develop severe pain in any area injured in the assault or have any change in your condition that concerns you.  You faint or lose consciousness.  You develop chest pain or shortness of breath. Document Released: 09/03/2005 Document Revised: 11/26/2011 Document Reviewed: 04/21/2008 [facility] Patient Information 2015 [place], [place]. This information is not  intended to replace advice given to you by your health care provider. Make sure you discuss any questions you have with your health care provider.   Emergency Department Resource Guide 1) Find a Doctor and Pay Out of Pocket Although you won't have to find out who is covered by your insurance plan, it is a good idea to ask around and get recommendations. You will then need to call the office and see if the doctor you have chosen will accept you as a new patient and what types of options they offer for patients who are self-pay. Some doctors offer discounts or will set up payment plans for their patients who do not have insurance,  but you will need to ask so you aren't surprised when you get to your appointment.  2) Contact Your Local Health Department Not all health departments have doctors that can see patients for sick visits, but many do, so it is worth a call to see if yours does. If you don't know where your local health department is, you can check in your phone book. The CDC also has a tool to help you locate your state's health department, and many state websites also have listings of all of their local health departments.  3) Find a Walk-in Clinic If your illness is not likely to be very severe or complicated, you may want to try a walk in clinic. These are popping up all over the country in pharmacies, drugstores, and shopping centers. They're usually staffed by nurse practitioners or physician assistants that have been trained to treat common illnesses and complaints. They're usually fairly quick and inexpensive. However, if you have serious medical issues or chronic medical problems, these are probably not your best option.  No Primary Care Doctor: - Call Health Connect at  [phone] - they can help you locate a primary care doctor that  accepts your insurance, provides certain services, etc. - Physician Referral Service- [phone]  Chronic Pain Problems: Organization          Address  Phone   Notes  [facility]  [phone] Patients need to be referred by their primary care doctor.   Medication Assistance: Organization         Address  Phone   Notes  [facility] Medication [facility] [address] [place] [place], [place] [address] [phone] --Must be a resident of [facility] -- Must have NO insurance coverage whatsoever (no Medicaid/ Medicare, etc.) -- The pt. MUST have a primary care doctor that directs their care regularly and follows them in the community   [facility]  [phone]   [employer]  [phone]    Agencies that provide inexpensive medical care: Organization         Address  Phone   Notes  [facility]  [phone]   [facility]    [phone]   [facility] [address] [place], [place] [address] [phone]   [facility] [address] [place], [place] [phone]   Planned Parenthood    [phone]   [facility]    [phone]   [facility] and [facility]  [address], [place] Phone:  [phone], Fax:  [phone] Hours of Operation:  9 am - 6 pm, M-F.  Also accepts Medicaid/Medicare and self-pay.  [facility]  [address], [place] Phone: [phone], Fax: [phone]. Hours of Operation:  8:30 am - 5:30 pm, M-F.  Also accepts Medicaid and self-pay.  [facility] [place] [address] [place] Phone: [phone]   [facility] [address] Natasha Bence [place], [place] [phone], Ext. [phone] Mondays & Thursdays: 7-9 AM.  First 15 patients are seen on a first come, first serve basis.    Medicaid-accepting [facility] Providers:  Organization         Address  Phone   Notes  [facility] [address], [place] [phone] Also accepts self-pay patients.  [facility] [address] [place],  [address] [place]  [phone]   [facility] [address], [place] [phone]   [facility] [address] [place] [phone]   Renaye Rakers [address], [place]   [phone] Only accepts [place] patients after they have their name applied to their card.   Self-Pay (no insurance) in [facility]:  Organization         Address  Phone   Notes  Sickle Cell Patients, [facility] [address] [place], [place] [phone]   [facility] [address] [place], [place] [phone]   [facility] [place]  [address], [place] [phone]   [facility]/Dr. Osei-Bonsu  [address] [place] or [address], [place] [phone] Phone number for both [place] and [place] locations is the same.  [facility] [address], [place] [phone]   [facility] [address] [place] or [address] [phone]   [facility] [address], [place] [phone], phone; [phone], fax Sees patients 1st and 3rd Saturday of every month.  Must not qualify for public or private insurance (i.e. Medicaid, Medicare, [place] Health Choice, Veterans' Benefits)  Household income should be no more than 200% of the poverty level The clinic cannot treat you if you are pregnant or think you are pregnant  Sexually transmitted diseases are not treated at the clinic.    Dental Care: Organization         Address  Phone  Notes  [facility] Department of [facility] [address] [place], [place] [phone] Accepts children up to age 1 who are enrolled in [place] or [place] Health Choice; pregnant women with a Medicaid card; and  children who have applied for Medicaid or [place] Health Choice, but were declined, whose parents can pay a reduced fee at time of service.  [facility] Department of [facility]  [address], [place] [phone] Accepts children up to age 69 who are enrolled in [place] or [place] Health Choice; pregnant women with a Medicaid card; and children who have applied for Medicaid or [place] Health Choice, but were declined, whose parents can pay a reduced fee at time of service.  [facility]  [address] [place], [place] [phone] Patients are seen by appointment only. Walk-ins are not accepted. [facility] will see patients 16 years of age and older. Monday - Tuesday (8am-5pm) Most Wednesdays (8:30-5pm) $30 per visit, cash only  [facility]  [address], [facility] [phone] Patients are seen by appointment only. Walk-ins are not accepted. [facility] will see patients 28 years of age and older. One Wednesday Evening (Monthly: Volunteer Based).  $30 per visit, cash only  [employer]  [phone] for adults; Children under age 52, call Graduate Pediatric Dentistry at [phone]. Children aged 34-14, please call [phone] to request a pediatric application.  Dental services are provided in all areas of dental care including fillings, crowns and bridges, complete and partial dentures, implants, gum treatment, root canals, and extractions. Preventive care is also provided. Treatment is provided to both adults and children. Patients are selected via a lottery  and there is often a waiting list.   [facility] [address] [place]  [phone] [email or web address]   [facility] [address] [place], [place] [phone], Ext. [phone] Second and Fourth Thursday of each month, opens at 6:30 AM; Clinic ends at 9 AM.  Patients are seen on a first-come first-served  basis, and a limited number are seen during each clinic.   [facility]  [address] Ether Griffins [place], [place] [phone]   Eligibility Requirements You must have lived in [place], [place], or [place] counties for at least the last three months.   You cannot be eligible for state or federal sponsored [place], including [employer], [place], or [employer].   You generally cannot be eligible for healthcare insurance through your employer.    How to apply: Eligibility screenings are held every Tuesday and Wednesday afternoon from 1:00 pm until 4:00 pm. You do not need an appointment for the interview!  [facility] [address] [place], [place] [phone]   [facility] Health Department  [phone]   [facility] Health Department  [phone]   [facility] Department  [phone]    Behavioral Health Resources in the Community: Intensive Outpatient Programs Organization         Address  Phone  Notes  [facility] [address] [place], [place] [phone]   [facility] [address], [place], [place] [phone]   [facility] [address], [place], [place]  [phone]   [facility] [address]  [place], [place] [phone] or [phone]   Substance Abuse Resources Organization         Address  Phone  Notes  [facility]  [phone]   [facility]  [phone]   [facility]  [phone]   Floydene Flock  [phone]   Residential & Outpatient Substance Abuse Program  [phone]   Psychological Services Organization         Address  Phone  Notes  [facility] Behavioral Health  [phone]   [facility] Services  [phone]   [facility] [address] [place] [phone] or [phone]    Mobile Crisis Teams Organization          Address  Phone  Notes  [facility], Mobile Crisis Care Unit  [phone]   [facility]  [address] [place], [place] [phone]   Doristine Locks [address] [place] [phone]    Self-Help/Support Groups Organization         Address  Phone             Notes  [facility] [place] - variety of support groups  [phone] Call for more information  Narcotics Anonymous (NA), [facility] [address], [employer] [place]  2 meetings at this location   Statistician         Address  Phone  Notes  [facility] [address],    [place]  [phone]   [facility]  [address] [place] [id number], [place], [place] [phone]   [facility] [address] [place], [place] [phone] Admissions: 8am-3pm M-F  [facility] [address],    [place], [place] [phone]   [facility] [address] [place], [place],  [place] [phone]   [facility] [address]  [place], [place] [phone]   [facility] [address], [place], [place] [phone]   [facility] [address]  [place], [place] [phone] or [phone]   [facility] [address] [place], [place] [phone] Accepts Medicaid  [facility] [address]  [place] [phone] Substance Abuse/Addiction Treatment   [facility]         Address  Phone  Notes  [facility]  [phone]) [phone]   Angie FavaJulie Brannon, PhD [address] [place], [place]   [phone](336) [phone] or [phone]) [phone]   [facility]   [address] [place], [place] [phone]) [phone]   [facility] [address], [place], [place] [phone](336) [phone] Insurance/Medicaid/sponsorship  through [facility] [address]                                    [place], [place] [phone]) [phone] Therapy/tele-psych/case  [facility] [address]   [place], [place] [phone]) [phone]    Dr. Lolly MustacheArfeen  [phone]   [facility]  [employer] [facility] 1) [address], [place] 2) [address], [place] 3)  [address], [place] [phone]  [phone]   [facility] Child [facility] Hotline [phone]) [phone] or [phone] (After Hours)
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
## 2015-01-09 ENCOUNTER — Encounter (HOSPITAL_COMMUNITY): Payer: Self-pay | Admitting: *Deleted

## 2015-01-09 ENCOUNTER — Inpatient Hospital Stay (HOSPITAL_COMMUNITY)
Admission: AD | Admit: 2015-01-09 | Discharge: 2015-01-09 | Disposition: A | Discharge status: Home / Self Care | Payer: Medicaid Other | Source: Ambulatory Visit | Attending: Obstetrics & Gynecology | Admitting: Obstetrics & Gynecology

## 2015-01-09 DIAGNOSIS — N926 Irregular menstruation, unspecified: Secondary | ICD-10-CM | POA: Insufficient documentation

## 2015-01-09 DIAGNOSIS — F17210 Nicotine dependence, cigarettes, uncomplicated: Secondary | ICD-10-CM | POA: Insufficient documentation

## 2015-01-09 DIAGNOSIS — K529 Noninfective gastroenteritis and colitis, unspecified: Secondary | ICD-10-CM | POA: Insufficient documentation

## 2015-01-09 LAB — URINALYSIS, ROUTINE W REFLEX MICROSCOPIC
BILIRUBIN URINE: NEGATIVE
Glucose, UA: NEGATIVE mg/dL
Ketones, ur: NEGATIVE mg/dL
Nitrite: NEGATIVE
PROTEIN: NEGATIVE mg/dL
Specific Gravity, Urine: >1.03 — ABNORMAL HIGH (ref 1.005–1.030)
UROBILINOGEN UA: 0.2 mg/dL (ref 0.0–1.0)
pH: 5.5 (ref 5.0–8.0)

## 2015-01-09 LAB — COMPREHENSIVE METABOLIC PANEL
ALT: 9 U/L (ref 0–35)
AST: 13 U/L (ref 0–37)
Albumin: 3.8 g/dL (ref 3.5–5.2)
Alkaline Phosphatase: 68 U/L (ref 39–117)
Anion gap: 6 (ref 5–15)
BILIRUBIN TOTAL: 0.2 mg/dL — AB (ref 0.3–1.2)
BUN: 6 mg/dL (ref 6–23)
CHLORIDE: 104 mmol/L (ref 96–112)
CO2: 27 mmol/L (ref 19–32)
Calcium: 8.8 mg/dL (ref 8.4–10.5)
Creatinine, Ser: 0.65 mg/dL (ref 0.50–1.10)
GFR calc Af Amer: >90 mL/min (ref >90)
GLUCOSE: 95 mg/dL (ref 70–99)
POTASSIUM: 4.1 mmol/L (ref 3.5–5.1)
Sodium: 137 mmol/L (ref 135–145)
TOTAL PROTEIN: 6.3 g/dL (ref 6.0–8.3)

## 2015-01-09 LAB — CBC
HCT: 36.4 % (ref 36.0–46.0)
Hemoglobin: 11.9 g/dL — ABNORMAL LOW (ref 12.0–15.0)
MCH: 30 pg (ref 26.0–34.0)
MCHC: 32.7 g/dL (ref 30.0–36.0)
MCV: 91.7 fL (ref 78.0–100.0)
Platelets: 288 10*3/uL (ref 150–400)
RBC: 3.97 MIL/uL (ref 3.87–5.11)
RDW: 13.5 % (ref 11.5–15.5)
WBC: 8.9 10*3/uL (ref 4.0–10.5)

## 2015-01-09 LAB — HCG, QUANTITATIVE, PREGNANCY: hCG, Beta Chain, Quant, S: <1 m[IU]/mL (ref <5)

## 2015-01-09 LAB — URINE MICROSCOPIC-ADD ON

## 2015-01-09 LAB — POCT PREGNANCY, URINE: PREG TEST UR: NEGATIVE

## 2015-01-09 MED ORDER — ONDANSETRON 8 MG PO TBDP
8.0000 mg | ORAL_TABLET | Freq: Once | ORAL | Status: AC
  Administered 2015-01-09: 8 mg via ORAL
  Filled 2015-01-09: qty 1

## 2015-01-09 NOTE — MAU Provider Note (Signed)
History     CSN: [id number]  Arrival date and time: 01/09/15 2050   First Provider Initiated Contact with Patient 01/09/15 2123      Chief Complaint  Patient presents with  . Vaginal Bleeding  . Nausea  . Emesis   HPI  Pt is a 29 yo [id number] here with report of spotting off an on for 2 weeks;notes pink or brown color. Nothing noted on pad in triage. LMP on 12/24/14 that lasted 3 days (normal flow).  Began spotting again two days ago, dark pink to clear.  Also reports  nausea and vomiting x 2 weeks; states unable to keep anything down. Vomited 7 times in past 24 hours.  Denies any pain at the present.   Denies fever, body aches or chills.  +loose bowel movement x 1 today.  Declines STD screen.   Past Medical History  Diagnosis Date  . Medical history non-contributory     Past Surgical History  Procedure Laterality Date  . Cesarean section    . Cesarean section N/A 11/23/2012    Procedure: CESAREAN SECTION;  Surgeon: Kathreen CosierBernard A Marshall, MD;  Location: [facility];  Service: Obstetrics;  Laterality: N/A;  Repeat Cesarean Section Delivery Baby    @      , Apgars   . Dilation and evacuation N/A 04/30/2014    Procedure: DILATATION AND EVACUATION;  Surgeon: Kathreen CosierBernard A Marshall, MD;  Location: [facility];  Service: Gynecology;  Laterality: N/A;    Family History  Problem Relation Age of Onset  . Asthma Father   . Asthma Brother     History  Substance Use Topics  . Smoking status: Current Some Day Smoker -- 0.25 packs/day  . Smokeless tobacco: Not on file  . Alcohol Use: No    Allergies:  Allergies  Allergen Reactions  . Latex Itching    Prescriptions prior to admission  Medication Sig Dispense Refill Last Dose  . HYDROcodone-acetaminophen (NORCO/VICODIN) 5-325 MG per tablet Take 1 tablet by mouth every 6 (six) hours as needed for moderate pain or severe pain. (Patient not taking: Reported on 01/09/2015) 6 tablet 0     Review of Systems  Constitutional: Negative for fever and  chills.  Gastrointestinal: Positive for nausea, vomiting and abdominal pain (cramping yesterday). Negative for diarrhea and constipation.  Genitourinary: Negative for dysuria, urgency, frequency and hematuria.  Musculoskeletal: Negative for myalgias.  Neurological: Negative for headaches.  All other systems reviewed and are negative.  Physical Exam   Blood pressure 129/67, pulse 74, temperature 98.4 F (36.9 C), temperature source Oral, resp. rate 18, height 5\' 4"  (1.626 m), weight 81.285 kg (179 lb 3.2 oz), last menstrual period 12/24/2014, SpO2 100 %, unknown if currently breastfeeding.  Physical Exam  Constitutional: She is oriented to person, place, and time. She appears well-developed and well-nourished. No distress.  HENT:  Head: Normocephalic.  Mouth/Throat: Mucous membranes are normal. Mucous membranes are not dry.  Neck: Normal range of motion. Neck supple.  Cardiovascular: Normal rate, regular rhythm and normal heart sounds.   Respiratory: Effort normal and breath sounds normal.  GI: Soft. There is no tenderness.  Hyperactive bowel sounds  Genitourinary: No bleeding in the vagina.  Neurological: She is alert and oriented to person, place, and time.  Skin: Skin is warm and dry.    MAU Course  Procedures  Results for orders placed or performed during the hospital encounter of 01/09/15 (from the past 24 hour(s))  Urinalysis, Routine w reflex microscopic  Status: Abnormal   Collection Time: 01/09/15  8:55 PM  Result Value Ref Range   Color, Urine YELLOW YELLOW   APPearance CLEAR CLEAR   Specific Gravity, Urine >1.030 (H) 1.005 - 1.030   pH 5.5 5.0 - 8.0   Glucose, UA NEGATIVE NEGATIVE mg/dL   Hgb urine dipstick LARGE (A) NEGATIVE   Bilirubin Urine NEGATIVE NEGATIVE   Ketones, ur NEGATIVE NEGATIVE mg/dL   Protein, ur NEGATIVE NEGATIVE mg/dL   Urobilinogen, UA 0.2 0.0 - 1.0 mg/dL   Nitrite NEGATIVE NEGATIVE   Leukocytes, UA TRACE (A) NEGATIVE  Urine  microscopic-add on     Status: Abnormal   Collection Time: 01/09/15  8:55 PM  Result Value Ref Range   Squamous Epithelial / LPF FEW (A) RARE   WBC, UA 0-2 <3 WBC/hpf   RBC / HPF 3-6 <3 RBC/hpf   Bacteria, UA FEW (A) RARE  Pregnancy, urine POC     Status: None   Collection Time: 01/09/15  9:08 PM  Result Value Ref Range   Preg Test, Ur NEGATIVE NEGATIVE  CBC     Status: Abnormal   Collection Time: 01/09/15  9:35 PM  Result Value Ref Range   WBC 8.9 4.0 - 10.5 K/uL   RBC 3.97 3.87 - 5.11 MIL/uL   Hemoglobin 11.9 (L) 12.0 - 15.0 g/dL   HCT 16.1 09.6 - 04.5 %   MCV 91.7 78.0 - 100.0 fL   MCH 30.0 26.0 - 34.0 pg   MCHC 32.7 30.0 - 36.0 g/dL   RDW 40.9 81.1 - 91.4 %   Platelets 288 150 - 400 K/uL  hCG, quantitative, pregnancy     Status: None   Collection Time: 01/09/15  9:35 PM  Result Value Ref Range   hCG, Beta Chain, Quant, S <1 <5 mIU/mL  Comprehensive metabolic panel     Status: Abnormal   Collection Time: 01/09/15  9:35 PM  Result Value Ref Range   Sodium 137 135 - 145 mmol/L   Potassium 4.1 3.5 - 5.1 mmol/L   Chloride 104 96 - 112 mmol/L   CO2 27 19 - 32 mmol/L   Glucose, Bld 95 70 - 99 mg/dL   BUN 6 6 - 23 mg/dL   Creatinine, Ser 7.82 0.50 - 1.10 mg/dL   Calcium 8.8 8.4 - 95.6 mg/dL   Total Protein 6.3 6.0 - 8.3 g/dL   Albumin 3.8 3.5 - 5.2 g/dL   AST 13 0 - 37 U/L   ALT 9 0 - 35 U/L   Alkaline Phosphatase 68 39 - 117 U/L   Total Bilirubin 0.2 (L) 0.3 - 1.2 mg/dL   GFR calc non Af Amer >90 >90 mL/min   GFR calc Af Amer >90 >90 mL/min   Anion gap 6 5 - 15   Zofran PO > reports relief from nausea  Assessment and Plan  Gastroenteritis Irregular Uterine Bleeding  Plan: Discharge to home Increase fluids Report fever or worsening of symptoms.    Eino Farber Kennith Gain, CNM

## 2015-01-09 NOTE — MAU Note (Signed)
Patient presents to MAU with c/o irregular vaginal bleeding. States has been spotting off an on for 2 weeks;notes pink or brown color. Nothing noted on pad in triage. Last LMP 12/24/14. Reports Nausea and vomiting x2 weeks; states unable to keep anything down. Denies any pain at the present. Denies abnormal discharge.

## 2015-06-28 ENCOUNTER — Inpatient Hospital Stay (HOSPITAL_COMMUNITY)
Admission: AD | Admit: 2015-06-28 | Discharge: 2015-06-28 | Disposition: A | Discharge status: Home / Self Care | Payer: Medicaid Other | Source: Ambulatory Visit | Attending: Family Medicine | Admitting: Family Medicine

## 2015-06-28 ENCOUNTER — Encounter (HOSPITAL_COMMUNITY): Payer: Self-pay | Admitting: *Deleted

## 2015-06-28 ENCOUNTER — Inpatient Hospital Stay (HOSPITAL_COMMUNITY): Payer: Medicaid Other

## 2015-06-28 DIAGNOSIS — O4690 Antepartum hemorrhage, unspecified, unspecified trimester: Secondary | ICD-10-CM

## 2015-06-28 DIAGNOSIS — O23591 Infection of other part of genital tract in pregnancy, first trimester: Secondary | ICD-10-CM | POA: Insufficient documentation

## 2015-06-28 DIAGNOSIS — O34219 Maternal care for unspecified type scar from previous cesarean delivery: Secondary | ICD-10-CM | POA: Diagnosis not present

## 2015-06-28 DIAGNOSIS — O4691 Antepartum hemorrhage, unspecified, first trimester: Secondary | ICD-10-CM | POA: Diagnosis not present

## 2015-06-28 DIAGNOSIS — Z3A01 Less than 8 weeks gestation of pregnancy: Secondary | ICD-10-CM | POA: Insufficient documentation

## 2015-06-28 DIAGNOSIS — A5901 Trichomonal vulvovaginitis: Secondary | ICD-10-CM

## 2015-06-28 DIAGNOSIS — O99331 Smoking (tobacco) complicating pregnancy, first trimester: Secondary | ICD-10-CM | POA: Diagnosis not present

## 2015-06-28 LAB — URINALYSIS, ROUTINE W REFLEX MICROSCOPIC
Bilirubin Urine: NEGATIVE
GLUCOSE, UA: NEGATIVE mg/dL
KETONES UR: NEGATIVE mg/dL
LEUKOCYTES UA: NEGATIVE
Nitrite: NEGATIVE
PROTEIN: NEGATIVE mg/dL
Specific Gravity, Urine: 1.02 (ref 1.005–1.030)
UROBILINOGEN UA: 0.2 mg/dL (ref 0.0–1.0)
pH: 8 (ref 5.0–8.0)

## 2015-06-28 LAB — URINE MICROSCOPIC-ADD ON

## 2015-06-28 LAB — CBC
HEMATOCRIT: 35.9 % — AB (ref 36.0–46.0)
HEMOGLOBIN: 11.7 g/dL — AB (ref 12.0–15.0)
MCH: 30 pg (ref 26.0–34.0)
MCHC: 32.6 g/dL (ref 30.0–36.0)
MCV: 92.1 fL (ref 78.0–100.0)
PLATELETS: 286 10*3/uL (ref 150–400)
RBC: 3.9 MIL/uL (ref 3.87–5.11)
RDW: 13.9 % (ref 11.5–15.5)
WBC: 8.4 10*3/uL (ref 4.0–10.5)

## 2015-06-28 LAB — WET PREP, GENITAL: Yeast Wet Prep HPF POC: NONE SEEN

## 2015-06-28 LAB — POCT PREGNANCY, URINE: Preg Test, Ur: POSITIVE — AB

## 2015-06-28 LAB — HCG, QUANTITATIVE, PREGNANCY: hCG, Beta Chain, Quant, S: 72475 m[IU]/mL — ABNORMAL HIGH (ref <5)

## 2015-06-28 MED ORDER — METRONIDAZOLE 500 MG PO TABS
2000.0000 mg | ORAL_TABLET | Freq: Once | ORAL | Status: AC
  Administered 2015-06-28: 2000 mg via ORAL
  Filled 2015-06-28: qty 4

## 2015-06-28 NOTE — MAU Provider Note (Signed)
Chief Complaint: Possible Pregnancy and Vaginal Bleeding   First Provider Initiated Contact with Patient 06/28/15 1100     SUBJECTIVE HPI: Lauren Pineda is a 29 y.o. [id number] at [redacted]w[redacted]d by LMP who presents to maternity admissions reporting bleeding which started this morning after intercourse.  Denies cramping, fever or other symptoms. History is remarkable for previous C/S births.  Aggravating factors: Intercourse Alleiviating factors: none  Vaginal Bleeding The patient's primary symptoms include missed menses and vaginal bleeding. The patient's pertinent negatives include no genital itching, genital lesions or genital odor. This is a new problem. The current episode started today. The problem occurs intermittently. The problem has been unchanged. The patient is experiencing no pain. She is pregnant. Pertinent negatives include no abdominal pain, back pain, chills, constipation, diarrhea, dysuria, fever, nausea or vomiting. The vaginal discharge was bloody. The vaginal bleeding is spotting. She has not been passing clots. She has not been passing tissue. Nothing aggravates the symptoms. She has tried nothing for the symptoms. She uses nothing for contraception.   RN Note: Pt reports red spotting since this morning after intercourse and pain 5/10. Pt describes the pain as cramping in her R lower abdomen  Past Medical History  Diagnosis Date  . Medical history non-contributory    Past Surgical History  Procedure Laterality Date  . Cesarean section    . Cesarean section N/A 11/23/2012    Procedure: CESAREAN SECTION;  Surgeon: Kathreen Cosier, MD;  Location: [facility];  Service: Obstetrics;  Laterality: N/A;  Repeat Cesarean Section Delivery Baby    @      , Apgars   . Dilation and evacuation N/A 04/30/2014    Procedure: DILATATION AND EVACUATION;  Surgeon: Kathreen Cosier, MD;  Location: [facility];  Service: Gynecology;  Laterality: N/A;   Social History   Social History  . Marital Status:  Single    Spouse Name: N/A  . Number of Children: N/A  . Years of Education: N/A   Occupational History  . Not on file.   Social History Main Topics  . Smoking status: Current Some Day Smoker -- 0.25 packs/day  . Smokeless tobacco: Not on file  . Alcohol Use: No  . Drug Use: No  . Sexual Activity: Yes    Birth Control/ Protection: None   Other Topics Concern  . Not on file   Social History Narrative   No current facility-administered medications on file prior to encounter.   No current outpatient prescriptions on file prior to encounter.   Allergies  Allergen Reactions  . Latex Itching    ROS: Pertinent items in HPI  Constitutional: Negative for fever and chills.  Gastrointestinal: Negative for nausea, vomiting, abdominal pain, diarrhea and constipation.  Genitourinary: Negative for dysuria.  Musculoskeletal: Negative for back pain.  Neurological: Negative for dizziness and weakness.   OBJECTIVE Blood pressure 121/74, pulse 88, temperature 98.1 F (36.7 C), temperature source Oral, resp. rate 18, last menstrual period 05/15/2015, unknown if currently breastfeeding. GENERAL: Well-developed, well-nourished female in no acute distress.  HEENT: Normocephalic HEART: normal rate RESP: normal effort ABDOMEN: Soft, non-tender EXTREMITIES: Nontender, no edema NEURO: Alert and oriented SPECULUM EXAM: Vagina pink with no lesions,  physiologic discharge, small amount of blood noted, cervix closed BIMANUAL: cervix long/closed,  uterus about 7-8 wk size, no adnexal tenderness or masses  LAB RESULTS Results for orders placed or performed during the hospital encounter of 06/28/15 (from the past 24 hour(s))  CBC     Status: Abnormal  Collection Time: 06/28/15 10:20 AM  Result Value Ref Range   WBC 8.4 4.0 - 10.5 K/uL   RBC 3.90 3.87 - 5.11 MIL/uL   Hemoglobin 11.7 (L) 12.0 - 15.0 g/dL   HCT 54.0 (L) 98.1 - 19.1 %   MCV 92.1 78.0 - 100.0 fL   MCH 30.0 26.0 - 34.0 pg    MCHC 32.6 30.0 - 36.0 g/dL   RDW 47.8 29.5 - 62.1 %   Platelets 286 150 - 400 K/uL  Pregnancy, urine POC     Status: Abnormal   Collection Time: 06/28/15 10:25 AM  Result Value Ref Range   Preg Test, Ur POSITIVE (A) NEGATIVE    IMAGING US Ob Comp Less 14 Wks  06/28/2015   CLINICAL DATA:  Vaginal bleeding and cramping. Gestational age by LMP of 6 weeks 2 days.  EXAM: OBSTETRIC <14 WK [place] AND TRANSVAGINAL OB US  TECHNIQUE: Both transabdominal and transvaginal ultrasound examinations were performed for complete evaluation of the gestation as well as the maternal uterus, adnexal regions, and pelvic cul-de-sac. Transvaginal technique was performed to assess early pregnancy.  COMPARISON:  None.  FINDINGS: Intrauterine gestational sac: Visualized/normal in shape.  Yolk sac:  Visualized  Embryo:  Visualized  Cardiac Activity: Visualized  Heart Rate: 125  bpm  CRL:  3  mm   6 w   0 d                  [place] EDC: 02/21/2016  Maternal uterus/adnexae: Both ovaries are normal in appearance. 3 cm simple appearing cyst seen in left adnexa, consistent with a benign paraovarian or ovarian cyst. Trace amount of free fluid noted.  IMPRESSION: Single living IUP measuring 6 weeks 0 days with [place] EDC of 02/21/2016.  3 cm benign appearing left adnexal cyst incidentally noted.   Electronically Signed   By: Myles Rosenthal M.D.   On: 06/28/2015 12:22   US Ob Transvaginal  06/28/2015   CLINICAL DATA:  Vaginal bleeding and cramping. Gestational age by LMP of 6 weeks 2 days.  EXAM: OBSTETRIC <14 WK [place] AND TRANSVAGINAL OB US  TECHNIQUE: Both transabdominal and transvaginal ultrasound examinations were performed for complete evaluation of the gestation as well as the maternal uterus, adnexal regions, and pelvic cul-de-sac. Transvaginal technique was performed to assess early pregnancy.  COMPARISON:  None.  FINDINGS: Intrauterine gestational sac: Visualized/normal in shape.  Yolk sac:  Visualized  Embryo:  Visualized  Cardiac Activity:  Visualized  Heart Rate: 125  bpm  CRL:  3  mm   6 w   0 d                  [place] EDC: 02/21/2016  Maternal uterus/adnexae: Both ovaries are normal in appearance. 3 cm simple appearing cyst seen in left adnexa, consistent with a benign paraovarian or ovarian cyst. Trace amount of free fluid noted.  IMPRESSION: Single living IUP measuring 6 weeks 0 days with [place] EDC of 02/21/2016.  3 cm benign appearing left adnexal cyst incidentally noted.   Electronically Signed   By: Myles Rosenthal M.D.   On: 06/28/2015 12:22    MEDICAL DECISION MAKING: This bleeding could represent a normal pregnancy with bleeding, spontaneous abortion or even an ectopic which can be life-threatening.   Cultures were done to rule out pelvic infection Blood drawn for Quant HCG, CBC, ABO/Rh  Given Flagyl 2gm PO x 1 for Trich infection   ASSESSMENT SIUP at [redacted]w[redacted]d by LMP, confirmed by [place]  First trimester bleeding Trichomonal vaginitis, probably the source of bleeding due to cervical friability  Plan:   Discharge home Discussed Trichomonas and implications for partner treatment. Reassured medication is safe Rx given for her partner and instructions for Expedited partner treatment were given Reassured baby is well List of OB providers given Encouraged to seek prenatal care soon.

## 2015-06-28 NOTE — Discharge Instructions (Signed)
Pelvic Rest Pelvic rest is sometimes recommended for women when:   The placenta is partially or completely covering the opening of the cervix (placenta previa).  There is bleeding between the uterine wall and the amniotic sac in the first trimester (subchorionic hemorrhage).  The cervix begins to open without labor starting (incompetent cervix, cervical insufficiency).  The labor is too early (preterm labor). HOME CARE INSTRUCTIONS  Do not have sexual intercourse, stimulation, or an orgasm.  Do not use tampons, douche, or put anything in the vagina.  Do not lift anything over 10 pounds (4.5 kg).  Avoid strenuous activity or straining your pelvic muscles. SEEK MEDICAL CARE IF:  You have any vaginal bleeding during pregnancy. Treat this as a potential emergency.  You have cramping pain felt low in the stomach (stronger than menstrual cramps).  You notice vaginal discharge (watery, mucus, or bloody).  You have a low, dull backache.  There are regular contractions or uterine tightening. SEEK IMMEDIATE MEDICAL CARE IF: You have vaginal bleeding and have placenta previa.    This information is not intended to replace advice given to you by your health care provider. Make sure you discuss any questions you have with your health care provider.   Document Released: 12/29/2010 Document Revised: 11/26/2011 Document Reviewed: 03/07/2015 Elsevier Interactive Patient Education 2016 [employer]. Trichomoniasis Trichomoniasis is an infection caused by an organism called Trichomonas. The infection can affect both women and men. In women, the outer female genitalia and the vagina are affected. In men, the penis is mainly affected, but the prostate and other reproductive organs can also be involved. Trichomoniasis is a sexually transmitted infection (STI) and is most often passed to another person through sexual contact.  RISK FACTORS  Having unprotected sexual intercourse.  Having sexual  intercourse with an infected partner. SIGNS AND SYMPTOMS  Symptoms of trichomoniasis in women include:  Abnormal gray-green frothy vaginal discharge.  Itching and irritation of the vagina.  Itching and irritation of the area outside the vagina. Symptoms of trichomoniasis in men include:   Penile discharge with or without pain.  Pain during urination. This results from inflammation of the urethra. DIAGNOSIS  Trichomoniasis may be found during a Pap test or physical exam. Your health care provider may use one of the following methods to help diagnose this infection:  Testing the pH of the vagina with a test tape.  Using a vaginal swab test that checks for the Trichomonas organism. A test is available that provides results within a few minutes.  Examining a urine sample.  Testing vaginal secretions. Your health care provider may test you for other STIs, including HIV. TREATMENT   You may be given medicine to fight the infection. Women should inform their health care provider if they could be or are pregnant. Some medicines used to treat the infection should not be taken during pregnancy.  Your health care provider may recommend over-the-counter medicines or creams to decrease itching or irritation.  Your sexual partner will need to be treated if infected.  Your health care provider may test you for infection again 3 months after treatment. HOME CARE INSTRUCTIONS   Take medicines only as directed by your health care provider.  Take over-the-counter medicine for itching or irritation as directed by your health care provider.  Do not have sexual intercourse while you have the infection.  Women should not douche or wear tampons while they have the infection.  Discuss your infection with your partner. Your partner may have gotten  the infection from you, or you may have gotten it from your partner.  Have your sex partner get examined and treated if necessary.  Practice safe,  informed, and protected sex.  See your health care provider for other STI testing. SEEK MEDICAL CARE IF:   You still have symptoms after you finish your medicine.  You develop abdominal pain.  You have pain when you urinate.  You have bleeding after sexual intercourse.  You develop a rash.  Your medicine makes you sick or makes you throw up (vomit). MAKE SURE YOU:  Understand these instructions.  Will watch your condition.  Will get help right away if you are not doing well or get worse.   This information is not intended to replace advice given to you by your health care provider. Make sure you discuss any questions you have with your health care provider.   Document Released: 02/27/2001 Document Revised: 09/24/2014 Document Reviewed: 06/15/2013 Elsevier Interactive Patient Education 2016 [employer]. Prenatal Care Providers [facility]    [facility]  [phone]     Phone: [phone]          [facility]                      [facility]  @[email or web address]     Phone: [phone]  Phone: [phone]         [facility]     Phone: [phone]  Phone: [phone]           [facility] @ [place]                hone: [phone]  Phone: [phone]         [facility] [address] Francoise Ceo      Phone: [phone]  Phone: [phone]         [facility]                Phone: [phone]  [facility]   [address] [place])          Phone: [phone] [facility]   Phone: [phone] Expedited Partner Therapy:  Information Sheet for Patients and Partners               You have been offered expedited partner therapy (EPT). This information sheet contains important information and warnings you need to be aware of, so please read it carefully.     Expedited Partner Therapy (EPT) is the clinical practice of treating the sexual partners of persons who receive chlamydia, gonorrhea, or trichomoniasis diagnoses by providing medications or prescriptions to the patient. Patients then provide partners with these therapies without the health-care provider having examined the partner. In other words, EPT is a convenient, fast and private way for patients to help their sexual partners get treated.   Chlamydia and gonorrhea are bacterial infections you get from having sex with a person who is already infected. Trichomoniasis (or trich) is a very common sexually transmitted infection (STI) that is caused by infection with a protozoan parasite called Trichomonas vaginalis.  Many people with these infections dont know it because they feel fine, but without treatment these infections can cause serious health problems, such as pelvic inflammatory disease, ectopic pregnancy, infertility and increased risk of HIV.   It is important to  get treated as soon as possible to protect your health, to avoid spreading these infections to others, and to prevent yourself from becoming re-infected. The good news is these infections can be easily cured with proper antibiotic medicine. The best way to take care of your self is to see a doctor or go to your local health department. If you are not able to see a doctor or other medical provider, you should take EPT.    Recommended Medication: EPT for Chlamydia:  Azithromycin (Zithromax) 1 gram orally in a single dose EPT for Gonorrhea:  Cefixime (Suprax) 400 milligrams orally in a single dose PLUS azithromycin (Zithromax) 1 gram orally in a single dose EPT for Trichomoniasis:  Metronidazole (Flagyl) 2 grams orally in a single dose   These medicines are very safe. However, you should not take them if you have ever had an allergic reaction (like a rash) to any of these medicines: azithromycin (Zithromax), erythromycin,  clarithromycin (Biaxin), metronidazole (Flagyl), tinidazole (Tindimax). If you are uncertain about whether you have an allergy, call your medical provider or pharmacist before taking this medicine. If you have a serious, long-term illness like kidney, liver or heart disease, colitis or stomach problems, or you are currently taking other prescription medication, talk to your provider before taking this medication.   Women: If you have lower belly pain, pain during sex, vomiting, or a fever, do not take this medicine. Instead, you should see a medical provider to be certain you do not have pelvic inflammatory disease (PID). PID can be serious and lead to infertility, pregnancy problems or chronic pelvic pain.   Pregnant Women: It is very important for you to see a doctor to get pregnancy services and pre-natal care. These antibiotics for EPT are safe for pregnant women, but you still need to see a medical provider as soon as possible. It is also important to note that Doxycycline is an alternative therapy for chlamydia, but it should not be taken by someone who is pregnant.   Men: If you have pain or swelling in the testicles or a fever, do not take this medicine and see a medical provider.     Men who have sex with men (MSM): MSM in [place] continue to experience high rates of syphilis and HIV. Many MSM with gonorrhea or chlamydia could also have syphilis and/or HIV and not know it. If you are a man who has sex with other men, it is very important that you see a medical provider and are tested for HIV and syphilis. EPT is not recommended for gonorrhea for MSM.  Recommended treatment for gonorrhea for MSM is Rocephin (shot) AND azithromycin due to decreased cure rate.  Please see your medical provider if this is the case.    Along with this information sheet is a prescription for the medicine. If you receive a prescription it will be in your name and will indicate your date of birth, or it will be in  the name of Expedited Partner Therapy.   In either case, you can have the prescription filled at a pharmacy. You will be responsible for the cost of the medicine, unless you have prescription drug coverage. In that case, you could provide your name so the pharmacy could bill your health plan.   Take the medication as directed. Some people will have a mild, upset stomach, which does not last long. AVOID alcohol 24 hours after taking metronidazole (Flagyl) to reduce the possibility of a disulfiram-like reaction (severe vomiting  and abdominal pain).  After taking the medicine, do not have sex for 7 days. Do not share this medicine or give it to anyone else. It is important to tell everyone you have had sex with in the last 60 days that they need to go and get tested for sexually transmitted infections.   Ways to prevent these and other sexually transmitted infections (STIs):    Abstain from sex. This is the only sure way to avoid getting an STI.   Use barrier methods, such as condoms, consistently and correctly.   Limit the number of sexual partners.   Have regular physical exams, including testing for STIs.   For more information about EPT or other issues pertaining to an STI, please contact your medical provider or the [facility] Department at [phone] or [email or web address]

## 2015-06-28 NOTE — MAU Note (Signed)
Pt unable to void at this time. 

## 2015-06-28 NOTE — MAU Note (Addendum)
Pt reports red spotting since this morning after intercourse and pain 5/10. Pt describes the pain as cramping in her R lower abdomen.

## 2015-06-28 NOTE — MAU Note (Signed)
+  preg test last week, has not been seen yet.  Started bleeding this morning, noted when wipes.  Some cramping, feels like cycle is going to start.

## 2015-06-29 LAB — GC/CHLAMYDIA PROBE AMP (~~LOC~~) NOT AT ARMC
Chlamydia: NEGATIVE
Neisseria Gonorrhea: NEGATIVE

## 2015-06-29 LAB — HIV ANTIBODY (ROUTINE TESTING W REFLEX): HIV Screen 4th Generation wRfx: NONREACTIVE

## 2015-08-15 ENCOUNTER — Encounter (HOSPITAL_COMMUNITY): Payer: Self-pay | Admitting: *Deleted

## 2015-08-15 ENCOUNTER — Inpatient Hospital Stay (HOSPITAL_COMMUNITY)
Admission: AD | Admit: 2015-08-15 | Discharge: 2015-08-15 | Disposition: A | Discharge status: Home / Self Care | Payer: Medicaid Other | Source: Ambulatory Visit | Attending: Family Medicine | Admitting: Family Medicine

## 2015-08-15 DIAGNOSIS — O99611 Diseases of the digestive system complicating pregnancy, first trimester: Secondary | ICD-10-CM | POA: Diagnosis not present

## 2015-08-15 DIAGNOSIS — O219 Vomiting of pregnancy, unspecified: Secondary | ICD-10-CM

## 2015-08-15 DIAGNOSIS — K219 Gastro-esophageal reflux disease without esophagitis: Secondary | ICD-10-CM | POA: Insufficient documentation

## 2015-08-15 DIAGNOSIS — Z87891 Personal history of nicotine dependence: Secondary | ICD-10-CM | POA: Insufficient documentation

## 2015-08-15 DIAGNOSIS — Z3A13 13 weeks gestation of pregnancy: Secondary | ICD-10-CM | POA: Diagnosis not present

## 2015-08-15 DIAGNOSIS — J00 Acute nasopharyngitis [common cold]: Secondary | ICD-10-CM

## 2015-08-15 DIAGNOSIS — O210 Mild hyperemesis gravidarum: Secondary | ICD-10-CM | POA: Insufficient documentation

## 2015-08-15 LAB — URINE MICROSCOPIC-ADD ON: BACTERIA UA: NONE SEEN

## 2015-08-15 LAB — URINALYSIS, ROUTINE W REFLEX MICROSCOPIC
Bilirubin Urine: NEGATIVE
GLUCOSE, UA: NEGATIVE mg/dL
Ketones, ur: 15 mg/dL — AB
LEUKOCYTES UA: NEGATIVE
Nitrite: NEGATIVE
PH: 6 (ref 5.0–8.0)
PROTEIN: NEGATIVE mg/dL

## 2015-08-15 MED ORDER — ONDANSETRON 4 MG PO TBDP
4.0000 mg | ORAL_TABLET | Freq: Once | ORAL | Status: AC
  Administered 2015-08-15: 4 mg via ORAL
  Filled 2015-08-15: qty 1

## 2015-08-15 MED ORDER — ONDANSETRON 4 MG PO TBDP: 4.0000 mg | ORAL_TABLET | Freq: Once | ORAL | Status: DC

## 2015-08-15 MED ORDER — FAMOTIDINE 20 MG PO TABS: 20.0000 mg | ORAL_TABLET | Freq: Two times a day (BID) | ORAL | Status: DC

## 2015-08-15 MED ORDER — FAMOTIDINE 20 MG PO TABS
20.0000 mg | ORAL_TABLET | Freq: Every day | ORAL | Status: DC
  Administered 2015-08-15: 20 mg via ORAL
  Filled 2015-08-15: qty 1

## 2015-08-15 NOTE — Discharge Instructions (Signed)
Hyperemesis Gravidarum °Hyperemesis gravidarum is a severe form of nausea and vomiting that happens during pregnancy. Hyperemesis is worse than morning sickness. It may cause you to have nausea or vomiting all day for many days. It may keep you from eating and drinking enough food and liquids. Hyperemesis usually occurs during the first half (the first 20 weeks) of pregnancy. It often goes away once a woman is in her second half of pregnancy. However, sometimes hyperemesis continues through an entire pregnancy.  °CAUSES  °The cause of this condition is not completely known but is thought to be related to changes in the body's hormones when pregnant. It could be from the high level of the pregnancy hormone or an increase in estrogen in the body.  °SIGNS AND SYMPTOMS  °· Severe nausea and vomiting. °· Nausea that does not go away. °· Vomiting that does not allow you to keep any food down. °· Weight loss and body fluid loss (dehydration). °· Having no desire to eat or not liking food you have previously enjoyed. °DIAGNOSIS  °Your health care provider will do a physical exam and ask you about your symptoms. He or she may also order blood tests and urine tests to make sure something else is not causing the problem.  °TREATMENT  °You may only need medicine to control the problem. If medicines do not control the nausea and vomiting, you will be treated in the hospital to prevent dehydration, increased acid in the blood (acidosis), weight loss, and changes in the electrolytes in your body that may harm the unborn baby (fetus). You may need IV fluids.  °HOME CARE INSTRUCTIONS  °· Only take over-the-counter or prescription medicines as directed by your health care provider. °· Try eating a couple of dry crackers or toast in the morning before getting out of bed. °· Avoid foods and smells that upset your stomach. °· Avoid fatty and spicy foods. °· Eat 5-6 small meals a day. °· Do not drink when eating meals. Drink between  meals. °· For snacks, eat high-protein foods, such as cheese. °· Eat or suck on things that have ginger in them. Ginger helps nausea. °· Avoid food preparation. The smell of food can spoil your appetite. °· Avoid iron pills and iron in your multivitamins until after 3-4 months of being pregnant. However, consult with your health care provider before stopping any prescribed iron pills. °SEEK MEDICAL CARE IF:  °· Your abdominal pain increases. °· You have a severe headache. °· You have vision problems. °· You are losing weight. °SEEK IMMEDIATE MEDICAL CARE IF:  °· You are unable to keep fluids down. °· You vomit blood. °· You have constant nausea and vomiting. °· You have excessive weakness. °· You have extreme thirst. °· You have dizziness or fainting. °· You have a fever or persistent symptoms for more than 2-3 days. °· You have a fever and your symptoms suddenly get worse. °MAKE SURE YOU:  °· Understand these instructions. °· Will watch your condition. °· Will get help right away if you are not doing well or get worse. °  °This information is not intended to replace advice given to you by your health care provider. Make sure you discuss any questions you have with your health care provider. °  °Document Released: 09/03/2005 Document Revised: 06/24/2013 Document Reviewed: 04/15/2013 °Elsevier Interactive Patient Education ©2016 Elsevier Inc[employer] °Gastroesophageal Reflux Disease, Adult °Normally, food travels down the esophagus and stays in the stomach to be digested. However, when a person   has gastroesophageal reflux disease (GERD), food and stomach acid move back up into the esophagus. When this happens, the esophagus becomes sore and inflamed. Over time, GERD can create small holes (ulcers) in the lining of the esophagus.  °CAUSES °This condition is caused by a problem with the muscle between the esophagus and the stomach (lower esophageal sphincter, or LES). Normally, the LES muscle closes after food passes through  the esophagus to the stomach. When the LES is weakened or abnormal, it does not close properly, and that allows food and stomach acid to go back up into the esophagus. The LES can be weakened by certain dietary substances, medicines, and medical conditions, including: °· Tobacco use. °· Pregnancy. °· Having a hiatal hernia. °· Heavy alcohol use. °· Certain foods and beverages, such as coffee, chocolate, onions, and peppermint. °RISK FACTORS °This condition is more likely to develop in: °· People who have an increased body weight. °· People who have connective tissue disorders. °· People who use NSAID medicines. °SYMPTOMS °Symptoms of this condition include: °· Heartburn. °· Difficult or painful swallowing. °· The feeling of having a lump in the throat. °· A bitter taste in the mouth. °· Bad breath. °· Having a large amount of saliva. °· Having an upset or bloated stomach. °· Belching. °· Chest pain. °· Shortness of breath or wheezing. °· Ongoing (chronic) cough or a night-time cough. °· Wearing away of tooth enamel. °· Weight loss. °Different conditions can cause chest pain. Make sure to see your health care provider if you experience chest pain. °DIAGNOSIS °Your health care provider will take a medical history and perform a physical exam. To determine if you have mild or severe GERD, your health care provider may also monitor how you respond to treatment. You may also have other tests, including: °· An endoscopy to examine your stomach and esophagus with a small camera. °· A test that measures the acidity level in your esophagus. °· A test that measures how much pressure is on your esophagus. °· A barium swallow or modified barium swallow to show the shape, size, and functioning of your esophagus. °TREATMENT °The goal of treatment is to help relieve your symptoms and to prevent complications. Treatment for this condition may vary depending on how severe your symptoms are. Your health care provider may  recommend: °· Changes to your diet. °· Medicine. °· Surgery. °HOME CARE INSTRUCTIONS °Diet °· Follow a diet as recommended by your health care provider. This may involve avoiding foods and drinks such as: °¨ Coffee and tea (with or without caffeine). °¨ Drinks that contain alcohol. °¨ Energy drinks and sports drinks. °¨ Carbonated drinks or sodas. °¨ Chocolate and cocoa. °¨ Peppermint and mint flavorings. °¨ Garlic and onions. °¨ Horseradish. °¨ Spicy and acidic foods, including peppers, chili powder, curry powder, vinegar, hot sauces, and barbecue sauce. °¨ Citrus fruit juices and citrus fruits, such as oranges, lemons, and limes. °¨ Tomato-based foods, such as red sauce, chili, salsa, and pizza with red sauce. °¨ Fried and fatty foods, such as donuts, french fries, potato chips, and high-fat dressings. °¨ High-fat meats, such as hot dogs and fatty cuts of red and white meats, such as rib eye steak, sausage, ham, and bacon. °¨ High-fat dairy items, such as whole milk, butter, and cream cheese. °· Eat small, frequent meals instead of large meals. °· Avoid drinking large amounts of liquid with your meals. °· Avoid eating meals during the 2-3 hours before bedtime. °· Avoid lying down right after you eat. °· Do   not exercise right after you eat. ° General Instructions  °· Pay attention to any changes in your symptoms. °· Take over-the-counter and prescription medicines only as told by your health care provider. Do not take aspirin, ibuprofen, or other NSAIDs unless your health care provider told you to do so. °· Do not use any tobacco products, including cigarettes, chewing tobacco, and e-cigarettes. If you need help quitting, ask your health care provider. °· Wear loose-fitting clothing. Do not wear anything tight around your waist that causes pressure on your abdomen. °· Raise (elevate) the head of your bed 6 inches (15cm). °· Try to reduce your stress, such as with yoga or meditation. If you need help reducing  stress, ask your health care provider. °· If you are overweight, reduce your weight to an amount that is healthy for you. Ask your health care provider for guidance about a safe weight loss goal. °· Keep all follow-up visits as told by your health care provider. This is important. °SEEK MEDICAL CARE IF: °· You have new symptoms. °· You have unexplained weight loss. °· You have difficulty swallowing, or it hurts to swallow. °· You have wheezing or a persistent cough. °· Your symptoms do not improve with treatment. °· You have a hoarse voice. °SEEK IMMEDIATE MEDICAL CARE IF: °· You have pain in your arms, neck, jaw, teeth, or back. °· You feel sweaty, dizzy, or light-headed. °· You have chest pain or shortness of breath. °· You vomit and your vomit looks like blood or coffee grounds. °· You faint. °· Your stool is bloody or black. °· You cannot swallow, drink, or eat. °  °This information is not intended to replace advice given to you by your health care provider. Make sure you discuss any questions you have with your health care provider. °  °Document Released: 06/13/2005 Document Revised: 05/25/2015 Document Reviewed: 12/29/2014 °Elsevier Interactive Patient Education ©2016 [employer] ° °

## 2015-08-15 NOTE — MAU Note (Signed)
Vomiting for the last 3 months - is getting worse.  Pt thinks she may have acid reflux, is vomiting & choking when she is asleep, has heartburn.  Denies abd pain or bleeding.

## 2015-08-15 NOTE — MAU Provider Note (Signed)
History   [id number] at 13 wks in with c/o headcold, GERD that wakes her up at night and nausea and vomiting.  CSN: [id number]  Arrival date & time 08/15/15  1653   None     Chief Complaint  Patient presents with  . Emesis During Pregnancy    HPI  Past Medical History  Diagnosis Date  . Medical history non-contributory     Past Surgical History  Procedure Laterality Date  . Cesarean section    . Cesarean section N/A 11/23/2012    Procedure: CESAREAN SECTION;  Surgeon: Kathreen CosierBernard A Marshall, MD;  Location: [facility];  Service: Obstetrics;  Laterality: N/A;  Repeat Cesarean Section Delivery Baby    @      , Apgars   . Dilation and evacuation N/A 04/30/2014    Procedure: DILATATION AND EVACUATION;  Surgeon: Kathreen CosierBernard A Marshall, MD;  Location: [facility];  Service: Gynecology;  Laterality: N/A;    Family History  Problem Relation Age of Onset  . Asthma Father   . Asthma Brother     Social History  Substance Use Topics  . Smoking status: Former Smoker -- 0.25 packs/day  . Smokeless tobacco: None  . Alcohol Use: No    OB History    Gravida Para Term Preterm AB TAB SAB Ectopic Multiple Living   8 4 2 2 3  3   4       Review of Systems  Constitutional: Negative.   HENT: Positive for congestion and rhinorrhea.   Eyes: Negative.   Respiratory: Negative.   Cardiovascular: Negative.   Gastrointestinal: Positive for nausea and vomiting.  Endocrine: Negative.   Genitourinary: Negative.   Musculoskeletal: Negative.   Skin: Negative.   Neurological: Negative.   Hematological: Negative.   Psychiatric/Behavioral: Negative.     Allergies  Latex  Home Medications  No current outpatient prescriptions on file.  BP 102/66 mmHg  Pulse 78  Temp(Src) 98 F (36.7 C) (Oral)  Resp 18  LMP 05/15/2015  Physical Exam  Constitutional: She is oriented to person, place, and time. She appears well-developed and well-nourished.  HENT:  Head: Normocephalic.  Eyes: Pupils are equal, round,  and reactive to light.  Neck: Normal range of motion.  Cardiovascular: Normal rate, regular rhythm, normal heart sounds and intact distal pulses.   Pulmonary/Chest: Effort normal and breath sounds normal.  Abdominal: Soft. Bowel sounds are normal.  Musculoskeletal: Normal range of motion.  Neurological: She is alert and oriented to person, place, and time. She has normal reflexes.  Skin: Skin is warm and dry.  Psychiatric: She has a normal mood and affect. Her behavior is normal. Judgment and thought content normal.    MAU Course  Procedures (including critical care time)  Labs Reviewed  URINALYSIS, ROUTINE W REFLEX MICROSCOPIC (NOT [facility]) - Abnormal; Notable for the following:    Specific Gravity, Urine >1.030 (*)    Hgb urine dipstick MODERATE (*)    Ketones, ur 15 (*)    All other components within normal limits  URINE MICROSCOPIC-ADD ON - Abnormal; Notable for the following:    Squamous Epithelial / LPF 0-5 (*)    All other components within normal limits   No results found.   No diagnosis found.    MDM  Nausea vomiting of pregnancy headcold Gerd  Will tx with zofran and rantidine and d/c home

## 2015-08-21 IMAGING — CT CT ABD-PELV W/ CM
2 of 4 series · 10 of 46 positions shown, 11 images · IV contrast (Iodine)
Comparison: Pelvic ultrasound performed 04/21/2014

CLINICAL DATA: Lower abdominal pain.  Nausea and vomiting.

EXAM:
CT ABDOMEN AND PELVIS WITH CONTRAST
TECHNIQUE: Multidetector CT imaging of the abdomen and pelvis was performed
using the standard protocol following bolus administration of
intravenous contrast.
CONTRAST:  80mL OMNIPAQUE IOHEXOL 300 MG/ML  SOLN

[Series 201: routine, idose (2) · axial · 0.78mm/px · z∈[-848,-463]mm · 7 of 93 slices shown, 8 images]
[im 8/93  soft-tissue]
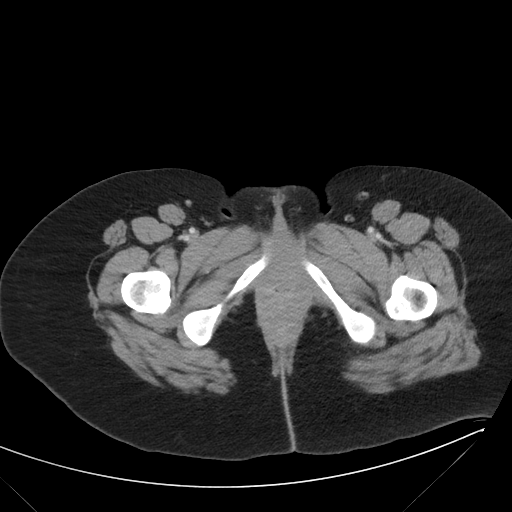
[im 8/93  bone]
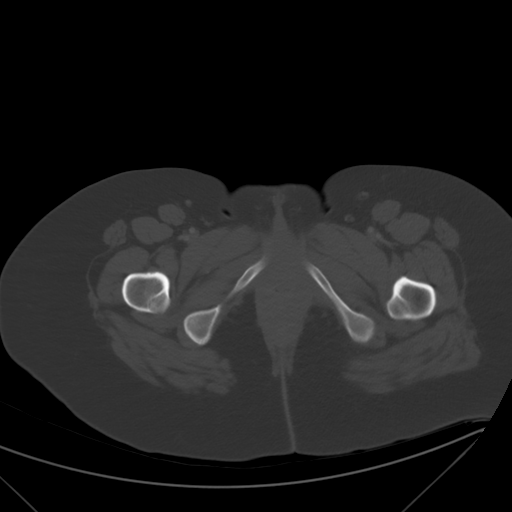
[im 20/93  soft-tissue]
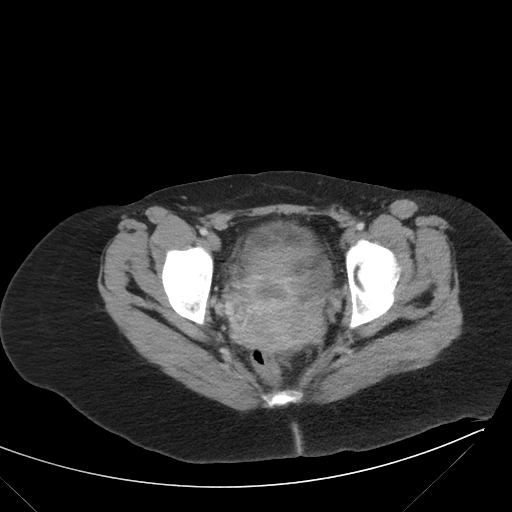
[im 35/93  soft-tissue]
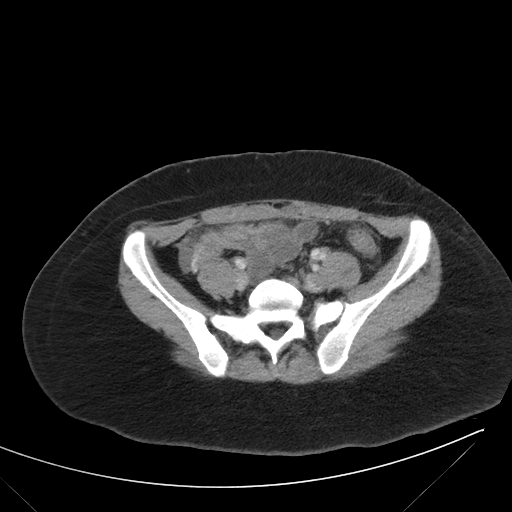
[im 47/93  soft-tissue]
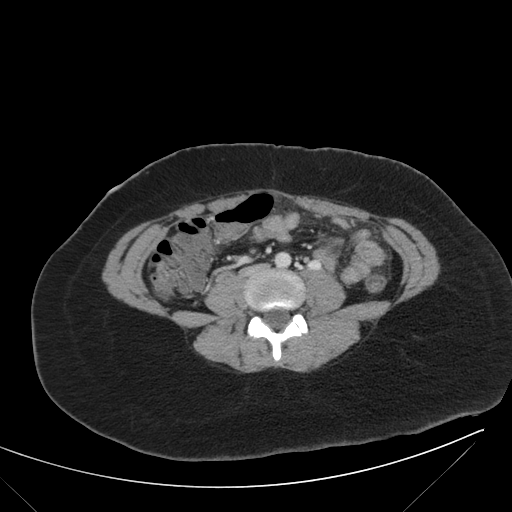
[im 58/93  soft-tissue]
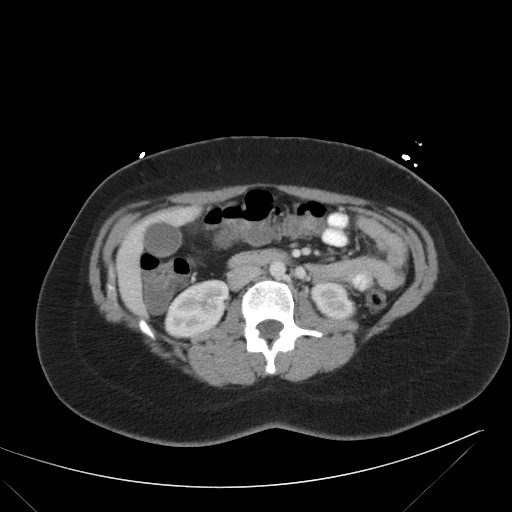
[im 73/93  soft-tissue]
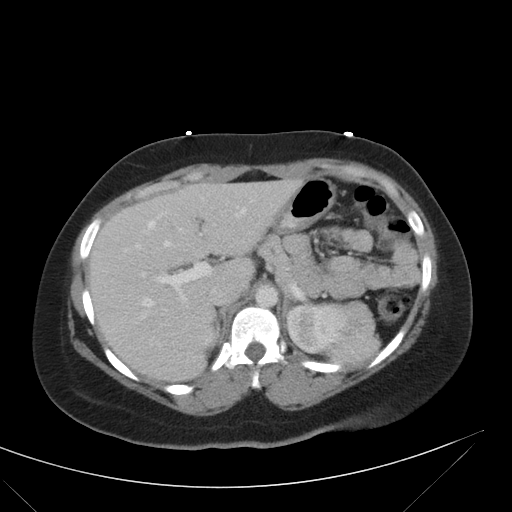
[im 85/93  soft-tissue]
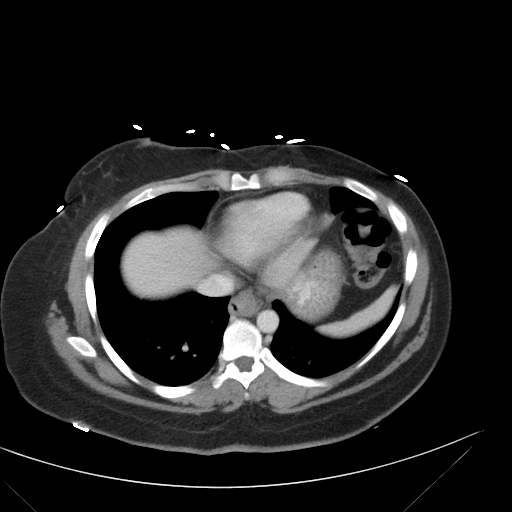

[Series 203: coronals, idose (2) · coronal · 0.45mm/px · 3 of 115 slices shown]
[im 39/115  soft-tissue]
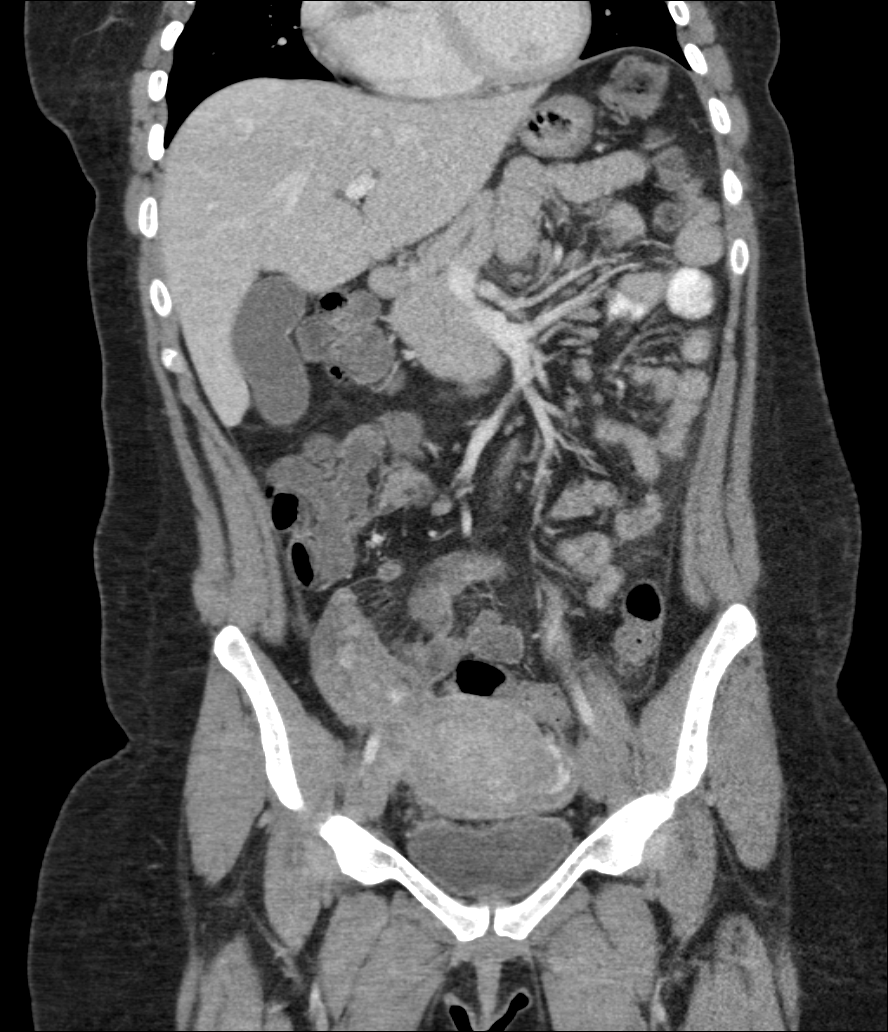
[im 51/115  soft-tissue]
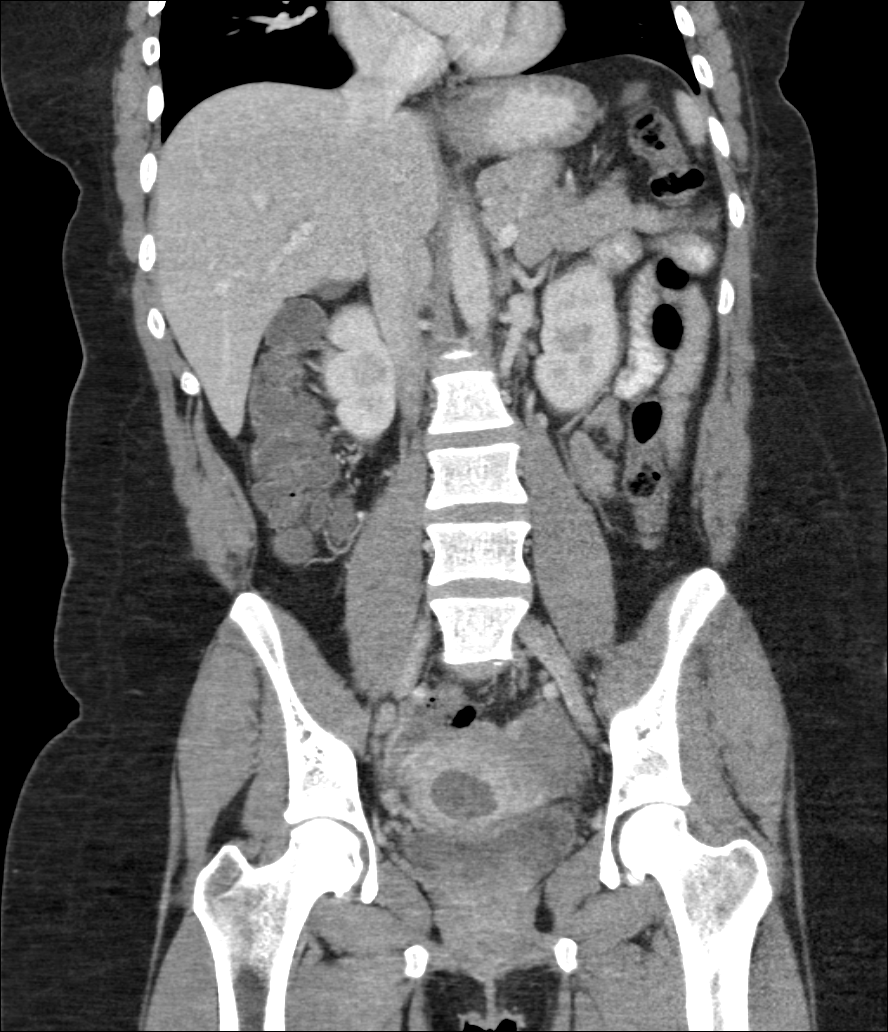
[im 64/115  soft-tissue]
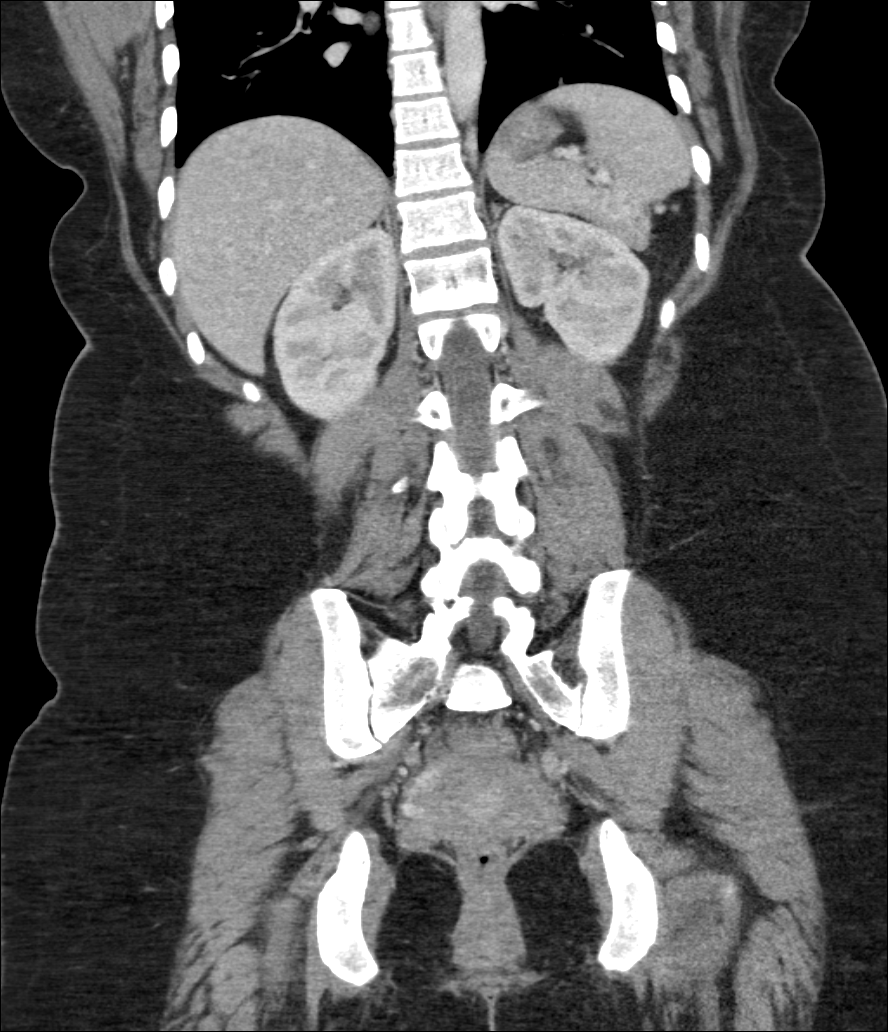

[10 of 46 positions shown; findings below may reference images not displayed]

FINDINGS: The visualized lung bases are clear.

There is minimal nonspecific prominence of the intrahepatic biliary
ducts. The liver is otherwise unremarkable. The common hepatic duct
remains normal in size. The gallbladder is within normal limits. The
pancreas and adrenal glands are unremarkable.

The kidneys are unremarkable in appearance. There is no evidence of
hydronephrosis. No renal or ureteral stones are seen. No perinephric
stranding is appreciated.

No free fluid is identified. The small bowel is unremarkable in
appearance. The stomach is within normal limits. No acute vascular
abnormalities are seen.

The appendix is distended, measuring up to 9 mm in maximal diameter,
with mild wall thickening. Though this could reflect appendicitis,
it could also reflect an underlying ovarian process, as soft tissue
inflammation appears centered about the right ovary.

The right ovary appears asymmetrically enlarged, with overlying soft
tissue inflammation tracking about the omentum. More mild soft
tissue inflammation is seen extending to the left side of the
pelvis. Trace free fluid within the pelvis is of increased
attenuation and may reflect serosanguineous fluid.

The colon is partially filled with fluid and air, and is
unremarkable in appearance.

The bladder is mildly distended and grossly unremarkable. A 3.9 cm
focus of decreased attenuation at the lower uterine segment may
reflect focal clot, given the appearance of the prior ultrasound.
The uterus is otherwise grossly unremarkable. The left ovary is
within normal limits. No inguinal lymphadenopathy is seen.

No acute osseous abnormalities are identified.
IMPRESSION: 1. Soft tissue inflammation within the pelvis, more prominent on the
right. This appears centered about the right ovary, with asymmetric
enlargement of the right ovary. Would consider pelvic ultrasound for
further evaluation.
2. Appendix distended to 9 mm in maximal diameter, with mild wall
thickening. Though this could reflect appendicitis, it could also
reflect the underlying ovarian process, as soft tissue inflammation
does not appear directly related to the appendix.
3. 3.9 cm focus of decreased attenuation at the lower uterine
segment may reflect focal clot, given prior ultrasound.
4. Trace free fluid within the pelvis, of increased attenuation;
this may reflect serosanguineous fluid.
5. Minimal nonspecific prominence of the intrahepatic biliary ducts,
without evidence of distal obstruction.

These results were called by telephone at the time of interpretation
on 04/30/2014 at [DATE] to JASIN JAMES GLOMPONG PA, who verbally
acknowledged these results.

## 2015-08-21 IMAGING — US US ART/VEN ABD/PELV/SCROTUM DOPPLER LTD
1 series · 13 of 25 positions shown · non-contrast
Comparison: Pelvic CT scan of today's date and pelvic ultrasound
April 21, 2014 and March 13, 2014.

CLINICAL DATA: Pelvic pain with abnormalities in the right aspect
of the pelvis on CT scan of today's date; history of miscarriage in
Thursday February, 2012 with bleeding intermittently since.

EXAM:
TRANSABDOMINAL AND TRANSVAGINAL ULTRASOUND OF PELVIS
DOPPLER ULTRASOUND OF OVARIES
TECHNIQUE: Both transabdominal and transvaginal ultrasound examinations of the
pelvis were performed. Transabdominal technique was performed for
global imaging of the pelvis including uterus, ovaries, adnexal
regions, and pelvic cul-de-sac.
It was necessary to proceed with endovaginal exam following the
transabdominal exam to visualize the endometrium and adnexal
structures. Color and duplex Doppler ultrasound was utilized to
evaluate blood flow to the ovaries.

[Series 1: us art/ven abd/pelv/scrotum doppler ltd · 0.20mm/px · 13 of 124 slices shown]
[im 1/124]
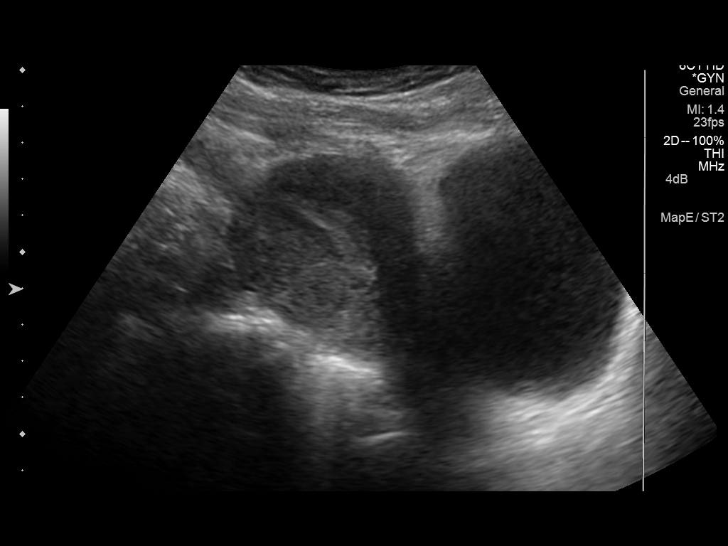
[im 11/124]
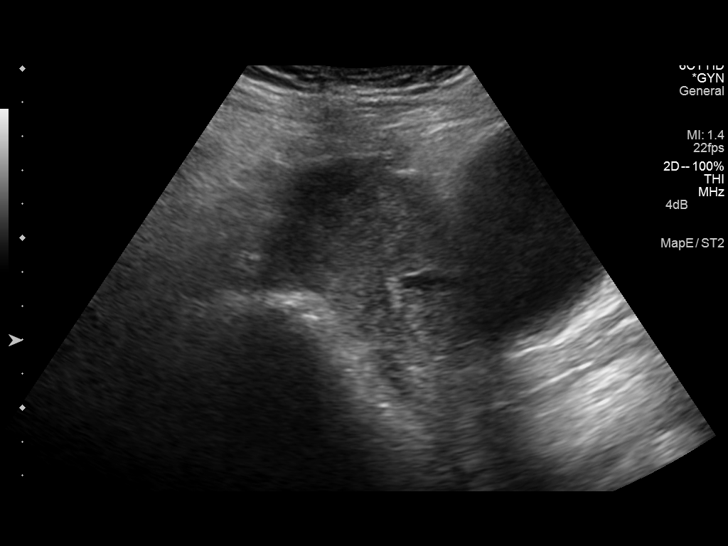
[im 21/124]
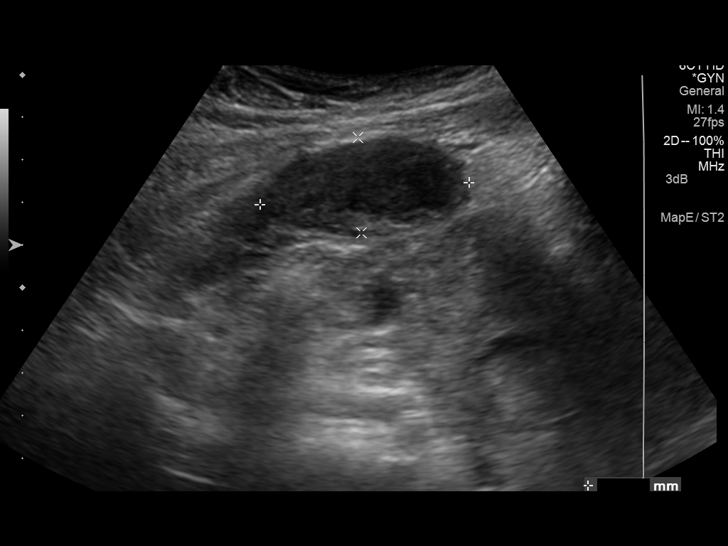
[im 31/124]
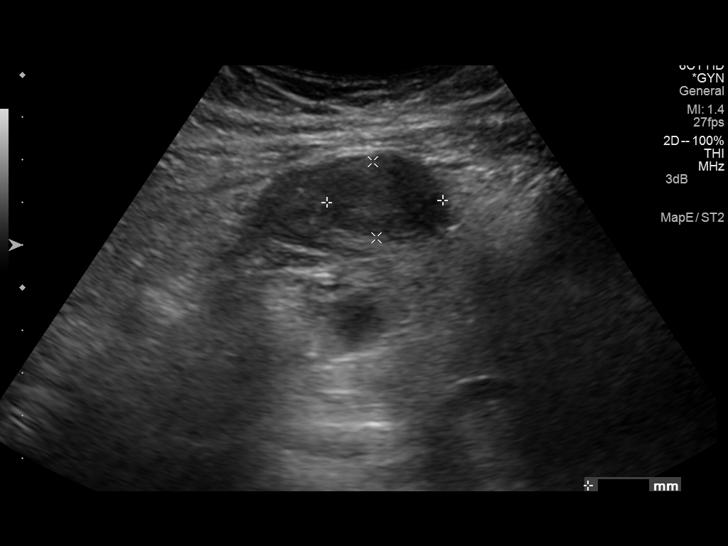
[im 42/124]
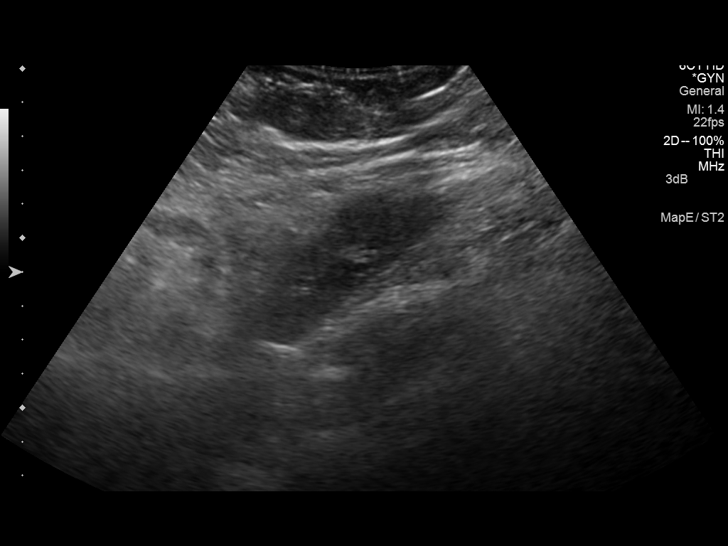
[im 52/124]
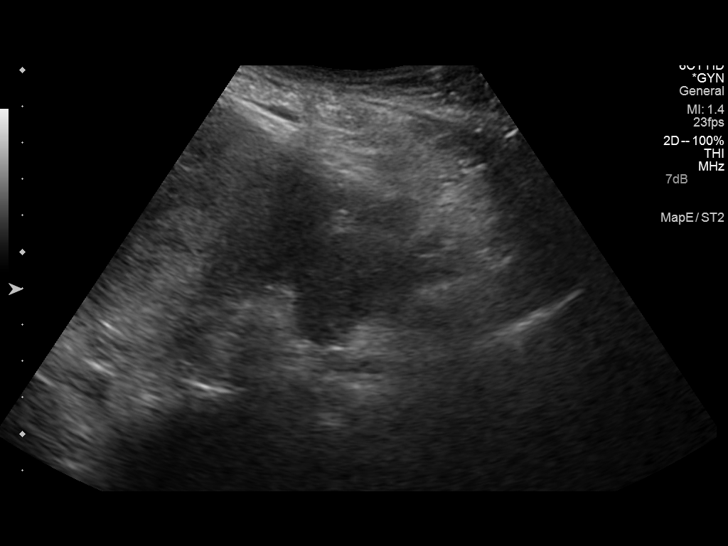
[im 62/124]
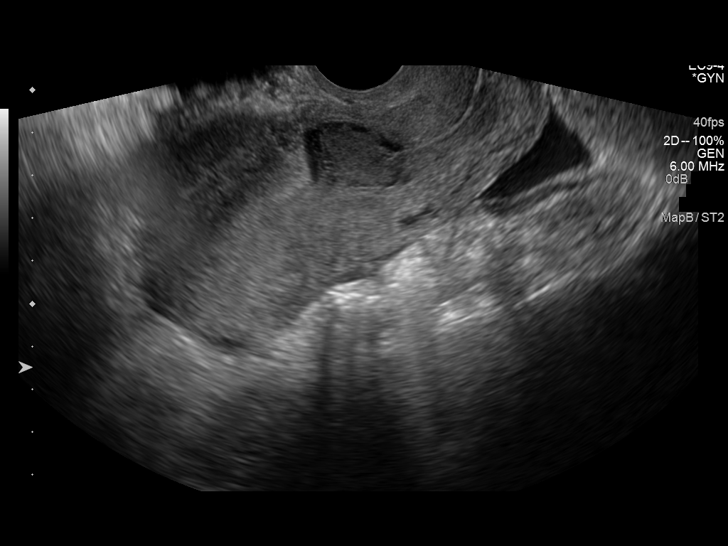
[im 72/124]
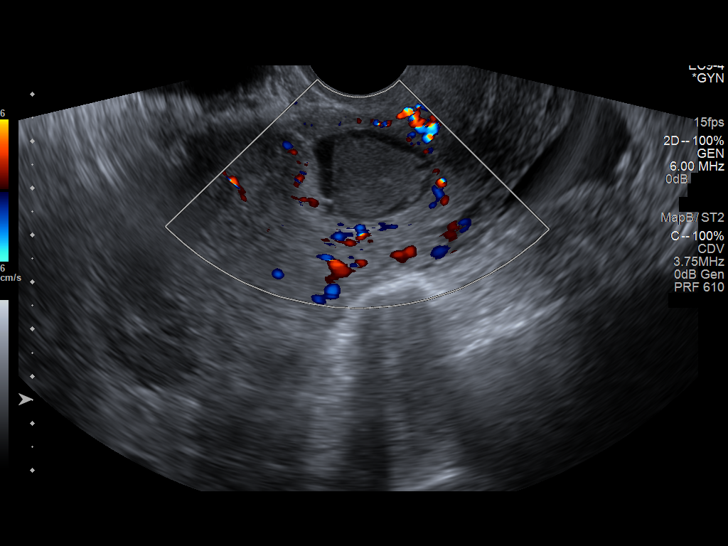
[im 83/124]
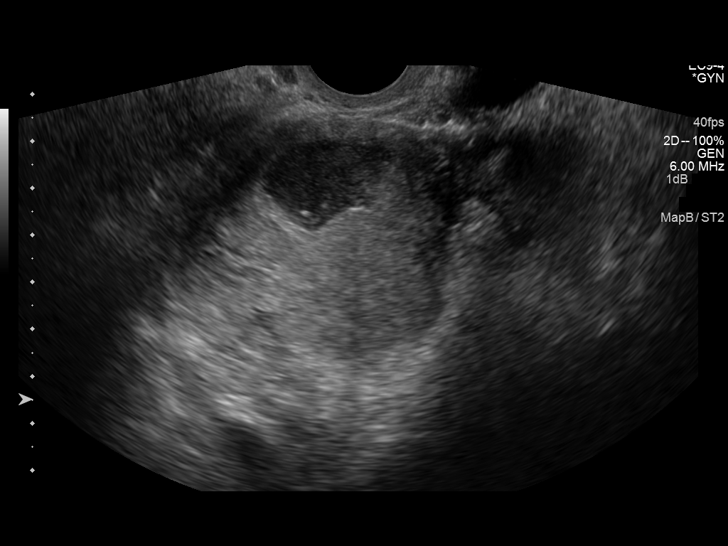
[im 93/124]
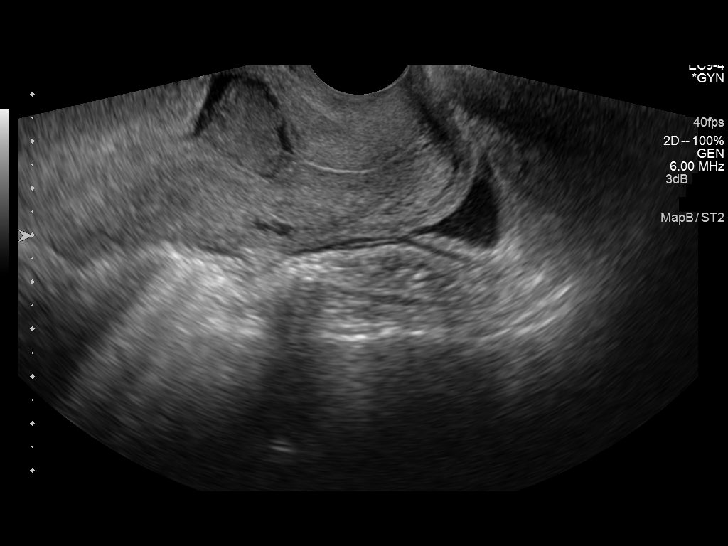
[im 103/124]
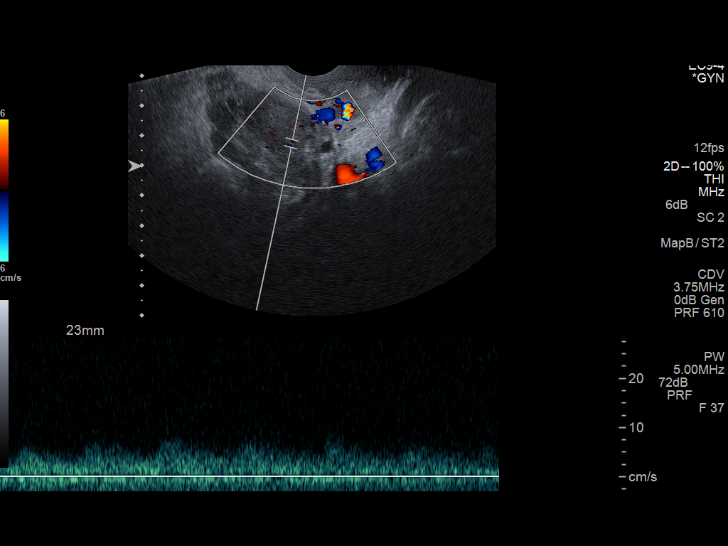
[im 113/124]
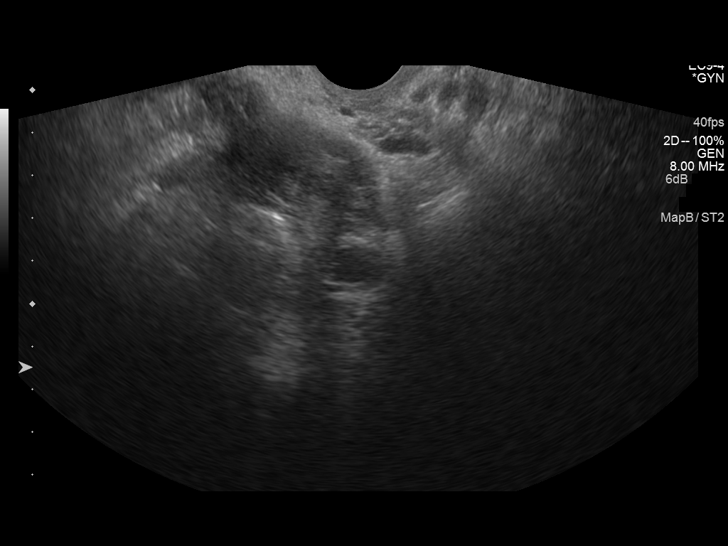
[im 124/124]
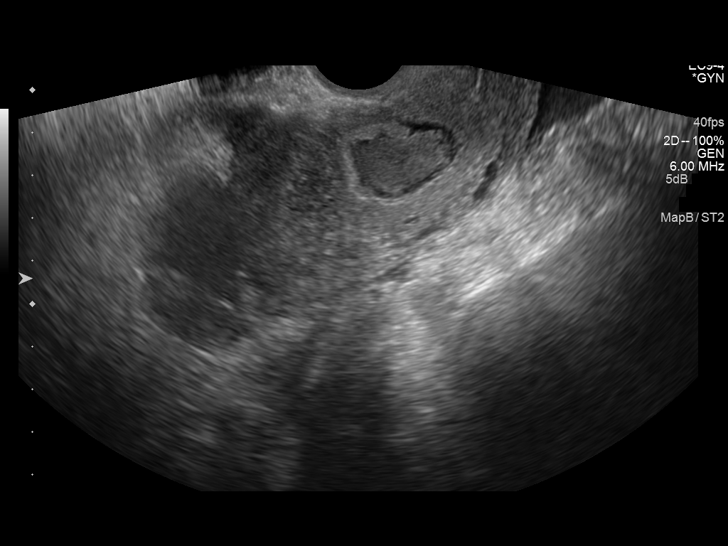

[13 of 25 positions shown; findings below may reference images not displayed]

FINDINGS: Uterus

Measurements: 10.8 x 5.0 x 5.4 cm. No fibroids or other mass
visualized.

Endometrium

Thickness: 5.4 mm. There is a slightly hyperechoic structure in the
lower uterine segment measuring 2.7 x 2 x 3 cm. There is small
amount of surrounding decreased echogenicity suggesting fluid. No
increased vascularity is demonstrated. It is more organized in
appearance than on the previous ultrasound and it is consistent with
the CT appearance today.

Right ovary

Measurements: 4.9 x 2.2 x 3.7 cm.. There is a hypoechoic nonvascular
structure associated with the right ovary measuring 2.7 x 1.8 x
cm. Its echotexture is fairly homogeneous. It is similar in
appearance to the earlier ultrasound findings.

Left ovary

Measurements: 3.7 x 2.6 x 2.1 cm. Normal appearance/no adnexal mass.

Pulsed Doppler evaluation of both ovaries demonstrates normal
low-resistance arterial and venous waveforms.

Other findings

There is a small amount of free pelvic fluid.
IMPRESSION: 1. Within the lower uterine segment there is a homogeneous slightly
hyperechoic structure which may reflect retracting clot with a small
amount of surrounding serum. No abnormal Doppler signal is
demonstrated. This is better organized than on the previous
ultrasound.Reportedly the patient has had vaginal bleeding since a
miscarriage in Saturday February, 2014. The possibility of retained products
conception is still raised.
2. Within the right ovary a non hypervascular slightly hypoechoic
structure is demonstrated. It is nonspecific but given its CT
appearance it could reflect a hemorrhagic cyst. Its ultrasound
appearance is not classic for tubo-ovarian abscess but this is not
entirely excluded.
3. The left ovary is normal in appearance.
4. There is a small amount of free pelvic fluid.
5. Correlation with a serum beta HCG is needed. Gynecological
evaluation and consideration for possible D+C is suggested.

## 2015-10-30 ENCOUNTER — Encounter (HOSPITAL_COMMUNITY): Payer: Self-pay | Admitting: Emergency Medicine

## 2015-10-30 ENCOUNTER — Emergency Department (HOSPITAL_COMMUNITY)
Admission: EM | Admit: 2015-10-30 | Discharge: 2015-10-30 | Disposition: A | Discharge status: Home / Self Care | Payer: Medicaid Other | Attending: Emergency Medicine | Admitting: Emergency Medicine

## 2015-10-30 DIAGNOSIS — O212 Late vomiting of pregnancy: Secondary | ICD-10-CM | POA: Diagnosis present

## 2015-10-30 DIAGNOSIS — A599 Trichomoniasis, unspecified: Secondary | ICD-10-CM | POA: Diagnosis not present

## 2015-10-30 DIAGNOSIS — Z3A24 24 weeks gestation of pregnancy: Secondary | ICD-10-CM | POA: Diagnosis not present

## 2015-10-30 DIAGNOSIS — O9989 Other specified diseases and conditions complicating pregnancy, childbirth and the puerperium: Secondary | ICD-10-CM | POA: Diagnosis not present

## 2015-10-30 DIAGNOSIS — R51 Headache: Secondary | ICD-10-CM | POA: Insufficient documentation

## 2015-10-30 DIAGNOSIS — Z91040 Latex allergy status: Secondary | ICD-10-CM | POA: Insufficient documentation

## 2015-10-30 DIAGNOSIS — R112 Nausea with vomiting, unspecified: Secondary | ICD-10-CM

## 2015-10-30 DIAGNOSIS — Z87891 Personal history of nicotine dependence: Secondary | ICD-10-CM | POA: Insufficient documentation

## 2015-10-30 DIAGNOSIS — Z79899 Other long term (current) drug therapy: Secondary | ICD-10-CM | POA: Insufficient documentation

## 2015-10-30 DIAGNOSIS — R197 Diarrhea, unspecified: Secondary | ICD-10-CM | POA: Insufficient documentation

## 2015-10-30 DIAGNOSIS — O98812 Other maternal infectious and parasitic diseases complicating pregnancy, second trimester: Secondary | ICD-10-CM | POA: Diagnosis not present

## 2015-10-30 LAB — URINALYSIS, ROUTINE W REFLEX MICROSCOPIC
BILIRUBIN URINE: NEGATIVE
Glucose, UA: NEGATIVE mg/dL
KETONES UR: NEGATIVE mg/dL
NITRITE: NEGATIVE
PH: 6 (ref 5.0–8.0)
Protein, ur: NEGATIVE mg/dL
Specific Gravity, Urine: 1.021 (ref 1.005–1.030)

## 2015-10-30 LAB — COMPREHENSIVE METABOLIC PANEL
ALBUMIN: 2.8 g/dL — AB (ref 3.5–5.0)
ALK PHOS: 63 U/L (ref 38–126)
ALT: 7 U/L — AB (ref 14–54)
AST: 11 U/L — AB (ref 15–41)
Anion gap: 11 (ref 5–15)
BILIRUBIN TOTAL: 0.2 mg/dL — AB (ref 0.3–1.2)
CALCIUM: 8.8 mg/dL — AB (ref 8.9–10.3)
CO2: 22 mmol/L (ref 22–32)
Chloride: 106 mmol/L (ref 101–111)
Creatinine, Ser: 0.46 mg/dL (ref 0.44–1.00)
GFR calc Af Amer: >60 mL/min (ref >60)
GFR calc non Af Amer: >60 mL/min (ref >60)
GLUCOSE: 97 mg/dL (ref 65–99)
Potassium: 3.6 mmol/L (ref 3.5–5.1)
Sodium: 139 mmol/L (ref 135–145)
TOTAL PROTEIN: 5.9 g/dL — AB (ref 6.5–8.1)

## 2015-10-30 LAB — CBC
HEMATOCRIT: 31.5 % — AB (ref 36.0–46.0)
Hemoglobin: 10.2 g/dL — ABNORMAL LOW (ref 12.0–15.0)
MCH: 30.4 pg (ref 26.0–34.0)
MCHC: 32.4 g/dL (ref 30.0–36.0)
MCV: 94 fL (ref 78.0–100.0)
Platelets: 267 10*3/uL (ref 150–400)
RBC: 3.35 MIL/uL — ABNORMAL LOW (ref 3.87–5.11)
RDW: 13.5 % (ref 11.5–15.5)
WBC: 11 10*3/uL — ABNORMAL HIGH (ref 4.0–10.5)

## 2015-10-30 LAB — URINE MICROSCOPIC-ADD ON

## 2015-10-30 LAB — LIPASE, BLOOD: Lipase: 28 U/L (ref 11–51)

## 2015-10-30 MED ORDER — CEPHALEXIN 500 MG PO CAPS: 500.0000 mg | ORAL_CAPSULE | Freq: Four times a day (QID) | ORAL | Status: DC

## 2015-10-30 MED ORDER — SODIUM CHLORIDE 0.9 % IV BOLUS (SEPSIS)
1000.0000 mL | Freq: Once | INTRAVENOUS | Status: AC
  Administered 2015-10-30: 1000 mL via INTRAVENOUS

## 2015-10-30 MED ORDER — ACETAMINOPHEN 500 MG PO TABS
1000.0000 mg | ORAL_TABLET | Freq: Once | ORAL | Status: AC
  Administered 2015-10-30: 1000 mg via ORAL
  Filled 2015-10-30: qty 2

## 2015-10-30 MED ORDER — ONDANSETRON HCL 4 MG/2ML IJ SOLN
4.0000 mg | Freq: Once | INTRAMUSCULAR | Status: AC
  Administered 2015-10-30: 4 mg via INTRAVENOUS
  Filled 2015-10-30: qty 2

## 2015-10-30 MED ORDER — AZITHROMYCIN 250 MG PO TABS
1000.0000 mg | ORAL_TABLET | Freq: Once | ORAL | Status: AC
  Administered 2015-10-30: 1000 mg via ORAL
  Filled 2015-10-30: qty 4

## 2015-10-30 MED ORDER — METRONIDAZOLE 500 MG PO TABS
2000.0000 mg | ORAL_TABLET | Freq: Once | ORAL | Status: AC
  Administered 2015-10-30: 2000 mg via ORAL
  Filled 2015-10-30: qty 4

## 2015-10-30 MED ORDER — ONDANSETRON 4 MG PO TBDP: 4.0000 mg | ORAL_TABLET | Freq: Three times a day (TID) | ORAL | Status: DC | PRN

## 2015-10-30 MED ORDER — DEXTROSE 5 % IV SOLN
1.0000 g | Freq: Once | INTRAVENOUS | Status: AC
  Administered 2015-10-30: 1 g via INTRAVENOUS
  Filled 2015-10-30: qty 10

## 2015-10-30 NOTE — ED Notes (Signed)
Pt aware of urine sample needed, pt stated shes unable to go at this time

## 2015-10-30 NOTE — ED Notes (Signed)
Patient with abdominal pain, nausea, vomiting and diarrhea. Patient states that her son has a GI bug for the last three days and she thinks that she may have gotten from him.  She states that she is having body aches.

## 2015-10-30 NOTE — ED Provider Notes (Addendum)
TIME SEEN: 6:00 AM  CHIEF COMPLAINT: Nausea, vomiting and diarrhea  HPI: Pt is a 30 y.o. female who presents emergency department with one day of nausea, vomiting and diarrhea. Reports she has had 4 episodes of vomiting and 5 episodes of diarrhea. Complains of headache and body aches as well. No fevers. States her 15-year-old son had similar symptoms. No recent travel outside the country. Has had previous C-sections but no other abdominal surgery. No vaginal bleeding, discharge, dysuria or hematuria. She is currently pregnant. Her estimated due date is 02/19/2016. Her OB/GYN a [address] [place]. She states she is feeling her baby move. She is a G7 P4.  She is 24 weeks today based on LMP.  ROS: See HPI Constitutional: no fever  Eyes: no drainage  ENT: no runny nose   Cardiovascular:  no chest pain  Resp: no SOB  GI: no vomiting GU: no dysuria Integumentary: no rash  Allergy: no hives  Musculoskeletal: no leg swelling  Neurological: no slurred speech ROS otherwise negative  PAST MEDICAL HISTORY/PAST SURGICAL HISTORY:  Past Medical History  Diagnosis Date  . Medical history non-contributory     MEDICATIONS:  Prior to Admission medications   Medication Sig Start Date End Date Taking? Authorizing Provider  Prenatal Vit-Fe Fumarate-FA (PRENATAL MULTIVITAMIN) TABS tablet Take 1 tablet by mouth daily at 12 noon.   Yes Historical Provider, MD  famotidine (PEPCID) 20 MG tablet Take 1 tablet (20 mg total) by mouth 2 (two) times daily. Patient not taking: Reported on 10/30/2015 08/15/15   Montez Morita, CNM  ondansetron (ZOFRAN-ODT) 4 MG disintegrating tablet Take 1 tablet (4 mg total) by mouth once. Patient not taking: Reported on 10/30/2015 08/15/15   Montez Morita, CNM    ALLERGIES:  Allergies  Allergen Reactions  . Latex Itching    SOCIAL HISTORY:  Social History  Substance Use Topics  . Smoking status: Former Smoker -- 0.25 packs/day  . Smokeless tobacco: Not on file  . Alcohol  Use: No    FAMILY HISTORY: Family History  Problem Relation Age of Onset  . Asthma Father   . Asthma Brother     EXAM: BP 124/80 mmHg  Pulse 76  Temp(Src) 98.2 F (36.8 C) (Oral)  Resp 17  Ht  (1.626 m)  Wt 186 lb (84.369 kg)  BMI 31.91 kg/m2  SpO2 100%  LMP 05/15/2015 CONSTITUTIONAL: Alert and oriented and responds appropriately to questions. Well-appearing; well-nourished, afebrile, in no distress HEAD: Normocephalic EYES: Conjunctivae clear, PERRL ENT: normal nose; no rhinorrhea; moist mucous membranes; pharynx without lesions noted NECK: Supple, no meningismus, no LAD  CARD: RRR; S1 and S2 appreciated; no murmurs, no clicks, no rubs, no gallops RESP: Normal chest excursion without splinting or tachypnea; breath sounds clear and equal bilaterally; no wheezes, no rhonchi, no rales, no hypoxia or respiratory distress, speaking full sentences ABD/GI: Normal bowel sounds; non-distended; soft, non-tender, no rebound, no guarding, no peritoneal signs, gravid uterus above the level of the umbilicus, completely benign abdominal exam BACK:  The back appears normal and is non-tender to palpation, there is no CVA tenderness EXT: Normal ROM in all joints; non-tender to palpation; no edema; normal capillary refill; no cyanosis, no calf tenderness or swelling    SKIN: Normal color for age and race; warm NEURO: Moves all extremities equally, sensation to light touch intact diffusely, cranial nerves II through XII intact PSYCH: The patient's mood and manner are appropriate. Grooming and personal hygiene are appropriate.  MEDICAL DECISION MAKING: Patient  here with vomiting, diarrhea similar to symptoms her son had. Suspect viral illness. Hemodynamically stable. Abdominal exam is benign. We'll treat symptomatically with IV fluids, Zofran, Tylenol. Will check fetal heart tones. Labs, urinalysis pending.  ED PROGRESS: Patient's labs unremarkable other than mild leukocytosis. LFTs, lipase,  creatinine normal. She is drinking without difficulty. We'll give second liter of IV fluids. Reports she feels she is unable to urinate. Repeat abdominal exam is still benign. Doubt appendicitis, small bowel obstruction, colitis, diverticulitis or other surgical pathology. Fetal heart tones normal.     Patient's urine shows blood, leukocytes, bacteria but also squamous cells. This may be a dirty catch but she is positive for trichomonas and pregnant. We'll give 2 g of Flagyl now and also treat her for gonorrhea and chlamydia empirically as she is pregnant and I am not able to see her previous screening tests but she states she does have outpatient follow-up. She is not having any lower abdominal pain on exam. No vaginal bleeding or discharge. She is not having any GU symptoms.  Discussed with her that is very unlikely that she has PID if she contracted any STDs after becoming pregnant because of her mucous plug but given I am unable to tell how long she has had Trichomonas and that she could've been exposed to other STDs feel it is reasonable to treat her for gonorrhea and chlamydia as well and she agrees. His custom patient that these in about exercise and pregnancy. She also appears to have possible urinary tract infection. We'll send urine culture and discharged on Keflex. She has not had any vomiting or diarrhea in the emergency department. She does have slightly soft blood pressures intermittently but this appears chronic for patient dating back to 2015. I feel she is safe to be discharged home. Discussed return precautions. She verbalizes understanding and is comfortable with this plan. She is aware that her partner will need to be treated for STDs as well. She will follow-up with her OB/GYN this week. Have offered her HIV and syphilis screening but she states she can follow-up as an outpatient for this.     Lauren Maw Drayke Grabel, DO 10/30/15 [phone]

## 2015-10-30 NOTE — Discharge Instructions (Signed)
You may take Tylenol 1000 mg every 6 hours as needed for pain.   You had Trichomonas in your urine which is a sexually transmitted disease. Your partner will need to be treated as well. No sexual intercourse for at least 1 week after both you and your partner have been treated. We have also given you treatment for gonorrhea and chlamydia which is less likely to cause issues when you are pregnant but given I am not able to see if you had this testing done early in your pregnancy we are treating you empirically given your positive for 1 STD. I recommend close outpatient follow up with your OB/GYN. I recommend screening for HIV and syphilis as well as this has not been done. We are also treating you for possible urinary tract infection with Keflex 4 times a day for 1 week.   Diarrhea Diarrhea is frequent loose and watery bowel movements. It can cause you to feel weak and dehydrated. Dehydration can cause you to become tired and thirsty, have a dry mouth, and have decreased urination that often is dark yellow. Diarrhea is a sign of another problem, most often an infection that will not last long. In most cases, diarrhea typically lasts 2-3 days. However, it can last longer if it is a sign of something more serious. It is important to treat your diarrhea as directed by your caregiver to lessen or prevent future episodes of diarrhea. CAUSES  Some common causes include:  Gastrointestinal infections caused by viruses, bacteria, or parasites.  Food poisoning or food allergies.  Certain medicines, such as antibiotics, chemotherapy, and laxatives.  Artificial sweeteners and fructose.  Digestive disorders. HOME CARE INSTRUCTIONS  Ensure adequate fluid intake (hydration): Have 1 cup (8 oz) of fluid for each diarrhea episode. Avoid fluids that contain simple sugars or sports drinks, fruit juices, whole milk products, and sodas. Your urine should be clear or pale yellow if you are drinking enough fluids.  Hydrate with an oral rehydration solution that you can purchase at pharmacies, retail stores, and online. You can prepare an oral rehydration solution at home by mixing the following ingredients together:   - tsp table salt.   tsp baking soda.   tsp salt substitute containing potassium chloride.  1  tablespoons sugar.  1 L (34 oz) of water.  Certain foods and beverages may increase the speed at which food moves through the gastrointestinal (GI) tract. These foods and beverages should be avoided and include:  Caffeinated and alcoholic beverages.  High-fiber foods, such as raw fruits and vegetables, nuts, seeds, and whole grain breads and cereals.  Foods and beverages sweetened with sugar alcohols, such as xylitol, sorbitol, and mannitol.  Some foods may be well tolerated and may help thicken stool including:  Starchy foods, such as rice, toast, pasta, low-sugar cereal, oatmeal, grits, baked potatoes, crackers, and bagels.  Bananas.  Applesauce.  Add probiotic-rich foods to help increase healthy bacteria in the GI tract, such as yogurt and fermented milk products.  Wash your hands well after each diarrhea episode.  Only take over-the-counter or prescription medicines as directed by your caregiver.  Take a warm bath to relieve any burning or pain from frequent diarrhea episodes. SEEK IMMEDIATE MEDICAL CARE IF:   You are unable to keep fluids down.  You have persistent vomiting.  You have blood in your stool, or your stools are black and tarry.  You do not urinate in 6-8 hours, or there is only a small amount of very  dark urine.  You have abdominal pain that increases or localizes.  You have weakness, dizziness, confusion, or light-headedness.  You have a severe headache.  Your diarrhea gets worse or does not get better.  You have a fever or persistent symptoms for more than 2-3 days.  You have a fever and your symptoms suddenly get worse. MAKE SURE YOU:    Understand these instructions.  Will watch your condition.  Will get help right away if you are not doing well or get worse.   This information is not intended to replace advice given to you by your health care provider. Make sure you discuss any questions you have with your health care provider.   Document Released: 08/24/2002 Document Revised: 09/24/2014 Document Reviewed: 05/11/2012 Elsevier Interactive Patient Education 2016 [employer]  Nausea and Vomiting Nausea is a sick feeling that often comes before throwing up (vomiting). Vomiting is a reflex where stomach contents come out of your mouth. Vomiting can cause severe loss of body fluids (dehydration). Children and elderly adults can become dehydrated quickly, especially if they also have diarrhea. Nausea and vomiting are symptoms of a condition or disease. It is important to find the cause of your symptoms. CAUSES   Direct irritation of the stomach lining. This irritation can result from increased acid production (gastroesophageal reflux disease), infection, food poisoning, taking certain medicines (such as nonsteroidal anti-inflammatory drugs), alcohol use, or tobacco use.  Signals from the brain.These signals could be caused by a headache, heat exposure, an inner ear disturbance, increased pressure in the brain from injury, infection, a tumor, or a concussion, pain, emotional stimulus, or metabolic problems.  An obstruction in the gastrointestinal tract (bowel obstruction).  Illnesses such as diabetes, hepatitis, gallbladder problems, appendicitis, kidney problems, cancer, sepsis, atypical symptoms of a heart attack, or eating disorders.  Medical treatments such as chemotherapy and radiation.  Receiving medicine that makes you sleep (general anesthetic) during surgery. DIAGNOSIS Your caregiver may ask for tests to be done if the problems do not improve after a few days. Tests may also be done if symptoms are severe or if  the reason for the nausea and vomiting is not clear. Tests may include:  Urine tests.  Blood tests.  Stool tests.  Cultures (to look for evidence of infection).  X-rays or other imaging studies. Test results can help your caregiver make decisions about treatment or the need for additional tests. TREATMENT You need to stay well hydrated. Drink frequently but in small amounts.You may wish to drink water, sports drinks, clear broth, or eat frozen ice pops or gelatin dessert to help stay hydrated.When you eat, eating slowly may help prevent nausea.There are also some antinausea medicines that may help prevent nausea. HOME CARE INSTRUCTIONS   Take all medicine as directed by your caregiver.  If you do not have an appetite, do not force yourself to eat. However, you must continue to drink fluids.  If you have an appetite, eat a normal diet unless your caregiver tells you differently.  Eat a variety of complex carbohydrates (rice, wheat, potatoes, bread), lean meats, yogurt, fruits, and vegetables.  Avoid high-fat foods because they are more difficult to digest.  Drink enough water and fluids to keep your urine clear or pale yellow.  If you are dehydrated, ask your caregiver for specific rehydration instructions. Signs of dehydration may include:  Severe thirst.  Dry lips and mouth.  Dizziness.  Dark urine.  Decreasing urine frequency and amount.  Confusion.  Rapid breathing  or pulse. SEEK IMMEDIATE MEDICAL CARE IF:   You have blood or brown flecks (like coffee grounds) in your vomit.  You have black or bloody stools.  You have a severe headache or stiff neck.  You are confused.  You have severe abdominal pain.  You have chest pain or trouble breathing.  You do not urinate at least once every 8 hours.  You develop cold or clammy skin.  You continue to vomit for longer than 24 to 48 hours.  You have a fever. MAKE SURE YOU:   Understand these  instructions.  Will watch your condition.  Will get help right away if you are not doing well or get worse.   This information is not intended to replace advice given to you by your health care provider. Make sure you discuss any questions you have with your health care provider.   Document Released: 09/03/2005 Document Revised: 11/26/2011 Document Reviewed: 01/31/2011 Elsevier Interactive Patient Education 2016 [employer].  Trichomoniasis Trichomoniasis is an infection caused by an organism called Trichomonas. The infection can affect both women and men. In women, the outer female genitalia and the vagina are affected. In men, the penis is mainly affected, but the prostate and other reproductive organs can also be involved. Trichomoniasis is a sexually transmitted infection (STI) and is most often passed to another person through sexual contact.  RISK FACTORS  Having unprotected sexual intercourse.  Having sexual intercourse with an infected partner. SIGNS AND SYMPTOMS  Symptoms of trichomoniasis in women include:  Abnormal gray-green frothy vaginal discharge.  Itching and irritation of the vagina.  Itching and irritation of the area outside the vagina. Symptoms of trichomoniasis in men include:   Penile discharge with or without pain.  Pain during urination. This results from inflammation of the urethra. DIAGNOSIS  Trichomoniasis may be found during a Pap test or physical exam. Your health care provider may use one of the following methods to help diagnose this infection:  Testing the pH of the vagina with a test tape.  Using a vaginal swab test that checks for the Trichomonas organism. A test is available that provides results within a few minutes.  Examining a urine sample.  Testing vaginal secretions. Your health care provider may test you for other STIs, including HIV. TREATMENT   You may be given medicine to fight the infection. Women should inform their health  care provider if they could be or are pregnant. Some medicines used to treat the infection should not be taken during pregnancy.  Your health care provider may recommend over-the-counter medicines or creams to decrease itching or irritation.  Your sexual partner will need to be treated if infected.  Your health care provider may test you for infection again 3 months after treatment. HOME CARE INSTRUCTIONS   Take medicines only as directed by your health care provider.  Take over-the-counter medicine for itching or irritation as directed by your health care provider.  Do not have sexual intercourse while you have the infection.  Women should not douche or wear tampons while they have the infection.  Discuss your infection with your partner. Your partner may have gotten the infection from you, or you may have gotten it from your partner.  Have your sex partner get examined and treated if necessary.  Practice safe, informed, and protected sex.  See your health care provider for other STI testing. SEEK MEDICAL CARE IF:   You still have symptoms after you finish your medicine.  You develop  abdominal pain.  You have pain when you urinate.  You have bleeding after sexual intercourse.  You develop a rash.  Your medicine makes you sick or makes you throw up (vomit). MAKE SURE YOU:  Understand these instructions.  Will watch your condition.  Will get help right away if you are not doing well or get worse.   This information is not intended to replace advice given to you by your health care provider. Make sure you discuss any questions you have with your health care provider.   Document Released: 02/27/2001 Document Revised: 09/24/2014 Document Reviewed: 06/15/2013 Elsevier Interactive Patient Education [employer]

## 2015-10-31 LAB — URINE CULTURE

## 2015-12-19 ENCOUNTER — Inpatient Hospital Stay (HOSPITAL_COMMUNITY): Payer: Medicaid Other

## 2015-12-19 ENCOUNTER — Encounter (HOSPITAL_COMMUNITY): Payer: Self-pay | Admitting: *Deleted

## 2015-12-19 ENCOUNTER — Inpatient Hospital Stay (HOSPITAL_COMMUNITY)
Admission: AD | Admit: 2015-12-19 | Discharge: 2015-12-19 | Disposition: A | Discharge status: Home / Self Care | Payer: Medicaid Other | Source: Ambulatory Visit | Attending: Obstetrics & Gynecology | Admitting: Obstetrics & Gynecology

## 2015-12-19 DIAGNOSIS — K219 Gastro-esophageal reflux disease without esophagitis: Secondary | ICD-10-CM | POA: Insufficient documentation

## 2015-12-19 DIAGNOSIS — B9689 Other specified bacterial agents as the cause of diseases classified elsewhere: Secondary | ICD-10-CM | POA: Insufficient documentation

## 2015-12-19 DIAGNOSIS — Z91040 Latex allergy status: Secondary | ICD-10-CM | POA: Diagnosis not present

## 2015-12-19 DIAGNOSIS — R102 Pelvic and perineal pain: Secondary | ICD-10-CM | POA: Insufficient documentation

## 2015-12-19 DIAGNOSIS — O26893 Other specified pregnancy related conditions, third trimester: Secondary | ICD-10-CM | POA: Insufficient documentation

## 2015-12-19 DIAGNOSIS — Z79899 Other long term (current) drug therapy: Secondary | ICD-10-CM | POA: Insufficient documentation

## 2015-12-19 DIAGNOSIS — Z3A31 31 weeks gestation of pregnancy: Secondary | ICD-10-CM | POA: Insufficient documentation

## 2015-12-19 DIAGNOSIS — M549 Dorsalgia, unspecified: Secondary | ICD-10-CM | POA: Diagnosis not present

## 2015-12-19 DIAGNOSIS — N96 Recurrent pregnancy loss: Secondary | ICD-10-CM | POA: Diagnosis not present

## 2015-12-19 DIAGNOSIS — D649 Anemia, unspecified: Secondary | ICD-10-CM | POA: Diagnosis present

## 2015-12-19 DIAGNOSIS — N760 Acute vaginitis: Secondary | ICD-10-CM | POA: Diagnosis not present

## 2015-12-19 DIAGNOSIS — O26849 Uterine size-date discrepancy, unspecified trimester: Secondary | ICD-10-CM

## 2015-12-19 DIAGNOSIS — O23593 Infection of other part of genital tract in pregnancy, third trimester: Secondary | ICD-10-CM | POA: Diagnosis not present

## 2015-12-19 DIAGNOSIS — O09213 Supervision of pregnancy with history of pre-term labor, third trimester: Secondary | ICD-10-CM

## 2015-12-19 DIAGNOSIS — Z87891 Personal history of nicotine dependence: Secondary | ICD-10-CM | POA: Insufficient documentation

## 2015-12-19 DIAGNOSIS — Z8751 Personal history of pre-term labor: Secondary | ICD-10-CM

## 2015-12-19 DIAGNOSIS — Z98891 History of uterine scar from previous surgery: Secondary | ICD-10-CM

## 2015-12-19 DIAGNOSIS — O288 Other abnormal findings on antenatal screening of mother: Secondary | ICD-10-CM

## 2015-12-19 DIAGNOSIS — Z8759 Personal history of other complications of pregnancy, childbirth and the puerperium: Secondary | ICD-10-CM

## 2015-12-19 LAB — URINALYSIS, ROUTINE W REFLEX MICROSCOPIC
BILIRUBIN URINE: NEGATIVE
Glucose, UA: NEGATIVE mg/dL
Ketones, ur: 15 mg/dL — AB
Leukocytes, UA: NEGATIVE
NITRITE: NEGATIVE
PROTEIN: NEGATIVE mg/dL
SPECIFIC GRAVITY, URINE: 1.02 (ref 1.005–1.030)
pH: 6 (ref 5.0–8.0)

## 2015-12-19 LAB — URINE MICROSCOPIC-ADD ON

## 2015-12-19 LAB — WET PREP, GENITAL
Sperm: NONE SEEN
Trich, Wet Prep: NONE SEEN
Yeast Wet Prep HPF POC: NONE SEEN

## 2015-12-19 LAB — FETAL FIBRONECTIN: Fetal Fibronectin: NEGATIVE

## 2015-12-19 MED ORDER — OXYCODONE-ACETAMINOPHEN 5-325 MG PO TABS
1.0000 | ORAL_TABLET | Freq: Four times a day (QID) | ORAL | Status: DC | PRN
Start: 2015-12-19 — End: 2016-01-22

## 2015-12-19 MED ORDER — CYCLOBENZAPRINE HCL 10 MG PO TABS
10.0000 mg | ORAL_TABLET | ORAL | Status: AC
  Administered 2015-12-19: 10 mg via ORAL
  Filled 2015-12-19: qty 1

## 2015-12-19 MED ORDER — METRONIDAZOLE 0.75 % VA GEL: 1.0000 | Freq: Every day | VAGINAL | Status: DC

## 2015-12-19 NOTE — MAU Note (Signed)
Urine in lab 

## 2015-12-19 NOTE — MAU Provider Note (Signed)
History   30 yo [id number] at 7231 1/7 weeks presented after calling office earlier today c/o pelvic and back pain since early am.  Also noting SOB when she stands or gets up, which has been going on "for awhile".  Denies leaking, bleeding, or dysuria, reports some feeling of urgency at times.  Last IC this week, > 24 hours ago--"my boyfriend thinks I am opening up down there".  Has decreased work from 13 hours/day to 8 hours/day.  Denies cough, fever, chest pain/pressure.  Reports significant issues with reflux--on Ranitidine BID, but often has reflux/vomiting during night.  On Fe daily for anemia--Hgb 10 at 27 weeks.  Patient aware of occasional UCs, but differentiates those from the back/pelvic pain she is feeling.  Patient Active Problem List   Diagnosis Date Noted  . Previous cesarean section x 3 12/19/2015  . H/O premature delivery x 2--one spontaneous, one induced 12/19/2015  . 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 History of prior pregnancy with IUGR newborn 12/19/2015  . Anemia 12/19/2015    Chief Complaint  Patient presents with  . Back Pain   HPI:  As above  OB History    Gravida Para Term Preterm AB TAB SAB Ectopic Multiple Living   8 4 2 2 3  3   4       Past Medical History  Diagnosis Date  . Medical history non-contributory     Past Surgical History  Procedure Laterality Date  . Cesarean section    . Cesarean section N/A 11/23/2012    Procedure: CESAREAN SECTION;  Surgeon: Kathreen CosierBernard A Marshall, MD;  Location: [facility];  Service: Obstetrics;  Laterality: N/A;  Repeat Cesarean Section Delivery Baby    @      , Apgars   . Dilation and evacuation N/A 04/30/2014    Procedure: DILATATION AND EVACUATION;  Surgeon: Kathreen CosierBernard A Marshall, MD;  Location: [facility];  Service: Gynecology;  Laterality: N/A;    Family History  Problem Relation Age of Onset  . Asthma Father   . Asthma Brother     Social History  Substance Use Topics  . Smoking  status: Former Smoker -- 0.25 packs/day  . Smokeless tobacco: None  . Alcohol Use: No    Allergies:  Allergies  Allergen Reactions  . Latex Itching    Prescriptions prior to admission  Medication Sig Dispense Refill Last Dose  . ferrous sulfate 325 (65 FE) MG tablet Take 325 mg by mouth daily.  1 Past Week at Unknown time  . ondansetron (ZOFRAN ODT) 4 MG disintegrating tablet Take 1 tablet (4 mg total) by mouth every 8 (eight) hours as needed for nausea or vomiting. 20 tablet 0 Past Week at Unknown time  . promethazine (PHENERGAN) 12.5 MG tablet Take 12.5 mg by mouth every 6 (six) hours as needed for nausea.   1 Past Week at Unknown time  . ranitidine (ZANTAC) 150 MG tablet Take 150 mg by mouth 2 (two) times daily as needed for heartburn.   1 Past Week at Unknown time  . cephALEXin (KEFLEX) 500 MG capsule Take 1 capsule (500 mg total) by mouth 4 (four) times daily. (Patient not taking: Reported on 12/19/2015) 28 capsule 0 Not Taking at Unknown time    ROS:  Pelvic pain, low back pain, SOB Physical Exam   Blood pressure 106/63, pulse 95, temperature 98.5 F (36.9 C), temperature source Oral, height 5\' 4"  (1.626 m), weight 84.482 kg (186 lb  4 oz), last menstrual period 05/15/2015, unknown if currently breastfeeding.    Physical Exam  Flat affect, but able to engage in conversation Chest clear Heart RRR without murmur Abd gravid, NT, FH 30 weeks Pelvic--small amount white d/c in vault, cervix soft, 1 cm, 50%, vtx, -2. Ext WNL  FHR Non-reactive, occasional quick variable, 1-2 10 beat accels in 30 min tracing. UCs--very subtle UCs noted approx q 10 min, but patient distinguishes those from other back pain and pelvic pain  ED Course  Assessment: IUP at 31 1/7 weeks Pelvic/back pain Hx PTD Previous C/S x 3 Latex allergy Hx IUGR Hx SAB x 3  Plan: FFN Wet prep GC/chlamydia UA GBS Flexeril po now. Transfer of care to [facility], CNM, for f/u on results.   Nigel Bridgeman CNM, MSN 12/19/2015 6:52 PM   Addendum (2023)  Care assumed.  Results as below.  In room to discuss and assess pain.  Patient reports pain remains the same despite flexeril dosing.   Filed Vitals:   12/19/15 1744  BP: 106/63  Pulse: 95  Temp: 98.5 F (36.9 C)  TempSrc: Oral  Height:  (1.626 m)  Weight: 84.482 kg (186 lb 4 oz)   Results for orders placed or performed during the hospital encounter of 12/19/15 (from the past 24 hour(s))  Fetal fibronectin     Status: None   Collection Time: 12/19/15  6:00 PM  Result Value Ref Range   Fetal Fibronectin NEGATIVE NEGATIVE  Wet prep, genital     Status: Abnormal   Collection Time: 12/19/15  6:00 PM  Result Value Ref Range   Yeast Wet Prep HPF POC NONE SEEN NONE SEEN   Trich, Wet Prep NONE SEEN NONE SEEN   Clue Cells Wet Prep HPF POC PRESENT (A) NONE SEEN   WBC, Wet Prep HPF POC FEW (A) NONE SEEN   Sperm NONE SEEN   Urinalysis, Routine w reflex microscopic (not at [facility])     Status: Abnormal   Collection Time: 12/19/15  7:55 PM  Result Value Ref Range   Color, Urine YELLOW YELLOW   APPearance CLEAR CLEAR   Specific Gravity, Urine 1.020 1.005 - 1.030   pH 6.0 5.0 - 8.0   Glucose, UA NEGATIVE NEGATIVE mg/dL   Hgb urine dipstick SMALL (A) NEGATIVE   Bilirubin Urine NEGATIVE NEGATIVE   Ketones, ur 15 (A) NEGATIVE mg/dL   Protein, ur NEGATIVE NEGATIVE mg/dL   Nitrite NEGATIVE NEGATIVE   Leukocytes, UA NEGATIVE NEGATIVE  Urine microscopic-add on     Status: Abnormal   Collection Time: 12/19/15  7:55 PM  Result Value Ref Range   Squamous Epithelial / LPF 0-5 (A) NONE SEEN   WBC, UA 0-5 0 - 5 WBC/hpf   RBC / HPF 0-5 0 - 5 RBC/hpf   Bacteria, UA FEW (A) NONE SEEN   Urine-Other MUCOUS PRESENT    A: IUP at 31.1wks Cat I FT Pelvic Pain Back Pain Grand MultiGravida H/O PTD Bacterial Vaginosis  P: BPP 8/8, AFI 15.6 (56%), Cephalic, Posterior, FHR 143, CL 3.9cm w/ normal appearance Metrogel Rx sent to pharmacy Rx for  Percocet 5/325 Q6-8hrs, Disp 14, RF 0 Patient educated on vaginal hygiene and back pain mgmt-Informational sheets given Patient reports canceling one appt and missing the reschedule.  Informed of need to schedule appt asap. Gc/Ct and GBS Pending Encouraged to call if any questions or concerns arise prior to next scheduled office visit.  Discharged to home in stable condition  Cherre Robins MSN, CNM 12/19/2015 8:35 PM

## 2015-12-19 NOTE — Discharge Instructions (Signed)
Back Pain in Pregnancy °Back pain during pregnancy is common. It happens in about half of all pregnancies. It is important for you and your baby that you remain active during your pregnancy. If you feel that back pain is not allowing you to remain active or sleep well, it is time to see your caregiver. Back pain may be caused by several factors related to changes during your pregnancy. Fortunately, unless you had trouble with your back before your pregnancy, the pain is likely to get better after you deliver. °Low back pain usually occurs between the fifth and seventh months of pregnancy. It can, however, happen in the first couple months. Factors that increase the risk of back problems include:  °· Previous back problems. °· Injury to your back. °· Having twins or multiple births. °· A chronic cough. °· Stress. °· Job-related repetitive motions. °· Muscle or spinal disease in the back. °· Family history of back problems, ruptured (herniated) discs, or osteoporosis. °· Depression, anxiety, and panic attacks. °CAUSES  °· When you are pregnant, your body produces a hormone called relaxin. This hormone makes the ligaments connecting the low back and pubic bones more flexible. This flexibility allows the baby to be delivered more easily. When your ligaments are loose, your muscles need to work harder to support your back. Soreness in your back can come from tired muscles. Soreness can also come from back tissues that are irritated since they are receiving less support. °· As the baby grows, it puts pressure on the nerves and blood vessels in your pelvis. This can cause back pain. °· As the baby grows and gets heavier during pregnancy, the uterus pushes the stomach muscles forward and changes your center of gravity. This makes your back muscles work harder to maintain good posture. °SYMPTOMS  °Lumbar pain during pregnancy °Lumbar pain during pregnancy usually occurs at or above the waist in the center of the back. There  may be pain and numbness that radiates into your leg or foot. This is similar to low back pain experienced by non-pregnant women. It usually increases with sitting for long periods of time, standing, or repetitive lifting. Tenderness may also be present in the muscles along your upper back. °Posterior pelvic pain during pregnancy °Pain in the back of the pelvis is more common than lumbar pain in pregnancy. It is a deep pain felt in your side at the waistline, or across the tailbone (sacrum), or in both places. You may have pain on one or both sides. This pain can also go into the buttocks and backs of the upper thighs. Pubic and groin pain may also be present. The pain does not quickly resolve with rest, and morning stiffness may also be present. °Pelvic pain during pregnancy can be brought on by most activities. A high level of fitness before and during pregnancy may or may not prevent this problem. Labor pain is usually 1 to 2 minutes apart, lasts for about 1 minute, and involves a bearing down feeling or pressure in your pelvis. However, if you are at term with the pregnancy, constant low back pain can be the beginning of early labor, and you should be aware of this. °DIAGNOSIS  °X-rays of the back should not be done during the first 12 to 14 weeks of the pregnancy and only when absolutely necessary during the rest of the pregnancy. MRIs do not give off radiation and are safe during pregnancy. MRIs also should only be done when absolutely necessary. °HOME CARE INSTRUCTIONS °· Exercise   as directed by your caregiver. Exercise is the most effective way to prevent or manage back pain. If you have a back problem, it is especially important to avoid sports that require sudden body movements. Swimming and walking are great activities. °· Do not stand in one place for long periods of time. °· Do not wear high heels. °· Sit in chairs with good posture. Use a pillow on your lower back if necessary. Make sure your head  rests over your shoulders and is not hanging forward. °· Try sleeping on your side, preferably the left side, with a pillow or two between your legs. If you are sore after a night's rest, your bed may be too soft. Try placing a board between your mattress and box spring. °· Listen to your body when lifting. If you are experiencing pain, ask for help or try bending your knees more so you can use your leg muscles rather than your back muscles. Squat down when picking up something from the floor. Do not bend over. °· Eat a healthy diet. Try to gain weight within your caregiver's recommendations. °· Use heat or cold packs 3 to 4 times a day for 15 minutes to help with the pain. °· Only take over-the-counter or prescription medicines for pain, discomfort, or fever as directed by your caregiver. °Sudden (acute) back pain °· Use bed rest for only the most extreme, acute episodes of back pain. Prolonged bed rest over 48 hours will aggravate your condition. °· Ice is very effective for acute conditions. °¨ Put ice in a plastic bag. °¨ Place a towel between your skin and the bag. °¨ Leave the ice on for 10 to 20 minutes every 2 hours, or as needed. °· Using heat packs for 30 minutes prior to activities is also helpful. °Continued back pain °See your caregiver if you have continued problems. Your caregiver can help or refer you for appropriate physical therapy. With conditioning, most back problems can be avoided. Sometimes, a more serious issue may be the cause of back pain. You should be seen right away if new problems seem to be developing. Your caregiver may recommend: °· A maternity girdle. °· An elastic sling. °· A back brace. °· A massage therapist or acupuncture. °SEEK MEDICAL CARE IF:  °· You are not able to do most of your daily activities, even when taking the pain medicine you were given. °· You need a referral to a physical therapist or chiropractor. °· You want to try acupuncture. °SEEK IMMEDIATE MEDICAL CARE  IF: °· You develop numbness, tingling, weakness, or problems with the use of your arms or legs. °· You develop severe back pain that is no longer relieved with medicines. °· You have a sudden change in bowel or bladder control. °· You have increasing pain in other areas of the body. °· You develop shortness of breath, dizziness, or fainting. °· You develop nausea, vomiting, or sweating. °· You have back pain which is similar to labor pains. °· You have back pain along with your water breaking or vaginal bleeding. °· You have back pain or numbness that travels down your leg. °· Your back pain developed after you fell. °· You develop pain on one side of your back. You may have a kidney stone. °· You see blood in your urine. You may have a bladder infection or kidney stone. °· You have back pain with blisters. You may have shingles. °Back pain is fairly common during pregnancy but should not be accepted as just part of   the process. Back pain should always be treated as soon as possible. This will make your pregnancy as pleasant as possible.   This information is not intended to replace advice given to you by your health care provider. Make sure you discuss any questions you have with your health care provider.   Document Released: 12/12/2005 Document Revised: 11/26/2011 Document Reviewed: 01/23/2011 Elsevier Interactive Patient Education 2016 Elsevier Inc. Bacterial Vaginosis Bacterial vaginosis is a vaginal infection that occurs when the normal balance of bacteria in the vagina is disrupted. It results from an overgrowth of certain bacteria. This is the most common vaginal infection in women of childbearing age. Treatment is important to prevent complications, especially in pregnant women, as it can cause a premature delivery. CAUSES  Bacterial vaginosis is caused by an increase in harmful bacteria that are normally present in smaller amounts in the vagina. Several different kinds of bacteria can cause  bacterial vaginosis. However, the reason that the condition develops is not fully understood. RISK FACTORS Certain activities or behaviors can put you at an increased risk of developing bacterial vaginosis, including:  Having a new sex partner or multiple sex partners.  Douching.  Using an intrauterine device (IUD) for contraception. Women do not get bacterial vaginosis from toilet seats, bedding, swimming pools, or contact with objects around them. SIGNS AND SYMPTOMS  Some women with bacterial vaginosis have no signs or symptoms. Common symptoms include:  Grey vaginal discharge.  A fishlike odor with discharge, especially after sexual intercourse.  Itching or burning of the vagina and vulva.  Burning or pain with urination. DIAGNOSIS  Your health care provider will take a medical history and examine the vagina for signs of bacterial vaginosis. A sample of vaginal fluid may be taken. Your health care provider will look at this sample under a microscope to check for bacteria and abnormal cells. A vaginal pH test may also be done.  TREATMENT  Bacterial vaginosis may be treated with antibiotic medicines. These may be given in the form of a pill or a vaginal cream. A second round of antibiotics may be prescribed if the condition comes back after treatment. Because bacterial vaginosis increases your risk for sexually transmitted diseases, getting treated can help reduce your risk for chlamydia, gonorrhea, HIV, and herpes. HOME CARE INSTRUCTIONS   Only take over-the-counter or prescription medicines as directed by your health care provider.  If antibiotic medicine was prescribed, take it as directed. Make sure you finish it even if you start to feel better.  Tell all sexual partners that you have a vaginal infection. They should see their health care provider and be treated if they have problems, such as a mild rash or itching.  During treatment, it is important that you follow these  instructions:  Avoid sexual activity or use condoms correctly.  Do not douche.  Avoid alcohol as directed by your health care provider.  Avoid breastfeeding as directed by your health care provider. SEEK MEDICAL CARE IF:   Your symptoms are not improving after 3 days of treatment.  You have increased discharge or pain.  You have a fever. MAKE SURE YOU:   Understand these instructions.  Will watch your condition.  Will get help right away if you are not doing well or get worse. FOR MORE INFORMATION  Centers for Disease Control and Prevention, Division of STD Prevention: [employer] American Sexual Health Association (ASHA): www.ashastd.org    This information is not intended to replace advice given to you by your  health care provider. Make sure you discuss any questions you have with your health care provider.   Document Released: 09/03/2005 Document Revised: 09/24/2014 Document Reviewed: 04/15/2013 Elsevier Interactive Patient Education [employer]2016 Elsevier Inc.

## 2015-12-19 NOTE — MAU Note (Signed)
Pt. States that her back hurts and that she is feeling pressure in her lower abdomen that comes and goes.  Pt. Also states that she feel like she is having a hard time taking deep breaths. Pt. Denies any bleeding or lof. Pt. Reports good Fetal movement.

## 2015-12-20 LAB — GC/CHLAMYDIA PROBE AMP (~~LOC~~) NOT AT ARMC
Chlamydia: NEGATIVE
NEISSERIA GONORRHEA: NEGATIVE

## 2015-12-21 LAB — CULTURE, BETA STREP (GROUP B ONLY)

## 2016-01-07 ENCOUNTER — Encounter (HOSPITAL_COMMUNITY): Payer: Self-pay

## 2016-01-07 ENCOUNTER — Inpatient Hospital Stay (HOSPITAL_COMMUNITY)
Admission: AD | Admit: 2016-01-07 | Discharge: 2016-01-07 | Disposition: A | Discharge status: Home / Self Care | Payer: Medicaid Other | Source: Ambulatory Visit | Attending: Obstetrics and Gynecology | Admitting: Obstetrics and Gynecology

## 2016-01-07 DIAGNOSIS — Z91040 Latex allergy status: Secondary | ICD-10-CM | POA: Insufficient documentation

## 2016-01-07 DIAGNOSIS — M5432 Sciatica, left side: Secondary | ICD-10-CM

## 2016-01-07 DIAGNOSIS — M545 Low back pain: Secondary | ICD-10-CM

## 2016-01-07 DIAGNOSIS — O26893 Other specified pregnancy related conditions, third trimester: Secondary | ICD-10-CM | POA: Insufficient documentation

## 2016-01-07 DIAGNOSIS — Z3A33 33 weeks gestation of pregnancy: Secondary | ICD-10-CM | POA: Diagnosis not present

## 2016-01-07 DIAGNOSIS — Z87891 Personal history of nicotine dependence: Secondary | ICD-10-CM | POA: Diagnosis not present

## 2016-01-07 DIAGNOSIS — E860 Dehydration: Secondary | ICD-10-CM | POA: Insufficient documentation

## 2016-01-07 DIAGNOSIS — Z79899 Other long term (current) drug therapy: Secondary | ICD-10-CM | POA: Insufficient documentation

## 2016-01-07 DIAGNOSIS — M549 Dorsalgia, unspecified: Secondary | ICD-10-CM | POA: Diagnosis present

## 2016-01-07 LAB — WET PREP, GENITAL
CLUE CELLS WET PREP: NONE SEEN
Sperm: NONE SEEN
TRICH WET PREP: NONE SEEN
YEAST WET PREP: NONE SEEN

## 2016-01-07 LAB — URINALYSIS, ROUTINE W REFLEX MICROSCOPIC
Glucose, UA: NEGATIVE mg/dL
Ketones, ur: 15 mg/dL — AB
NITRITE: NEGATIVE
Protein, ur: 30 mg/dL — AB
SPECIFIC GRAVITY, URINE: 1.015 (ref 1.005–1.030)
pH: 6.5 (ref 5.0–8.0)

## 2016-01-07 LAB — URINE MICROSCOPIC-ADD ON

## 2016-01-07 LAB — FETAL FIBRONECTIN: FETAL FIBRONECTIN: NEGATIVE

## 2016-01-07 MED ORDER — ACETAMINOPHEN 325 MG PO TABS: 650.0000 mg | ORAL_TABLET | Freq: Four times a day (QID) | ORAL | Status: DC | PRN

## 2016-01-07 MED ORDER — CYCLOBENZAPRINE HCL 10 MG PO TABS: 10.0000 mg | ORAL_TABLET | Freq: Three times a day (TID) | ORAL | Status: DC | PRN

## 2016-01-07 MED ORDER — ACETAMINOPHEN 500 MG PO TABS
1000.0000 mg | ORAL_TABLET | Freq: Once | ORAL | Status: AC
  Administered 2016-01-07: 1000 mg via ORAL
  Filled 2016-01-07: qty 2

## 2016-01-07 MED ORDER — CYCLOBENZAPRINE HCL 10 MG PO TABS
10.0000 mg | ORAL_TABLET | Freq: Once | ORAL | Status: AC
  Administered 2016-01-07: 10 mg via ORAL
  Filled 2016-01-07: qty 1

## 2016-01-07 NOTE — MAU Note (Signed)
Having lower back pain and lower abd pain all day Sat. Left leg keeps giving out on me. Unable to sleep. Denies LOF or bleeding. Has had early deliveries in the past.

## 2016-01-07 NOTE — Discharge Instructions (Signed)
Sciatica °Sciatica is pain, weakness, numbness, or tingling along the path of the sciatic nerve. The nerve starts in the lower back and runs down the back of each leg. The nerve controls the muscles in the lower leg and in the back of the knee, while also providing sensation to the back of the thigh, lower leg, and the sole of your foot. Sciatica is a symptom of another medical condition. For instance, nerve damage or certain conditions, such as a herniated disk or bone spur on the spine, pinch or put pressure on the sciatic nerve. This causes the pain, weakness, or other sensations normally associated with sciatica. Generally, sciatica only affects one side of the body. °CAUSES  °· Herniated or slipped disc. °· Degenerative disk disease. °· A pain disorder involving the narrow muscle in the buttocks (piriformis syndrome). °· Pelvic injury or fracture. °· Pregnancy. °· Tumor (rare). °SYMPTOMS  °Symptoms can vary from mild to very severe. The symptoms usually travel from the low back to the buttocks and down the back of the leg. Symptoms can include: °· Mild tingling or dull aches in the lower back, leg, or hip. °· Numbness in the back of the calf or sole of the foot. °· Burning sensations in the lower back, leg, or hip. °· Sharp pains in the lower back, leg, or hip. °· Leg weakness. °· Severe back pain inhibiting movement. °These symptoms may get worse with coughing, sneezing, laughing, or prolonged sitting or standing. Also, being overweight may worsen symptoms. °DIAGNOSIS  °Your caregiver will perform a physical exam to look for common symptoms of sciatica. He or she may ask you to do certain movements or activities that would trigger sciatic nerve pain. Other tests may be performed to find the cause of the sciatica. These may include: °· Blood tests. °· X-rays. °· Imaging tests, such as an MRI or CT scan. °TREATMENT  °Treatment is directed at the cause of the sciatic pain. Sometimes, treatment is not necessary  and the pain and discomfort goes away on its own. If treatment is needed, your caregiver may suggest: °· Over-the-counter medicines to relieve pain. °· Prescription medicines, such as anti-inflammatory medicine, muscle relaxants, or narcotics. °· Applying heat or ice to the painful area. °· Steroid injections to lessen pain, irritation, and inflammation around the nerve. °· Reducing activity during periods of pain. °· Exercising and stretching to strengthen your abdomen and improve flexibility of your spine. Your caregiver may suggest losing weight if the extra weight makes the back pain worse. °· Physical therapy. °· Surgery to eliminate what is pressing or pinching the nerve, such as a bone spur or part of a herniated disk. °HOME CARE INSTRUCTIONS  °· Only take over-the-counter or prescription medicines for pain or discomfort as directed by your caregiver. °· Apply ice to the affected area for 20 minutes, 3-4 times a day for the first 48-72 hours. Then try heat in the same way. °· Exercise, stretch, or perform your usual activities if these do not aggravate your pain. °· Attend physical therapy sessions as directed by your caregiver. °· Keep all follow-up appointments as directed by your caregiver. °· Do not wear high heels or shoes that do not provide proper support. °· Check your mattress to see if it is too soft. A firm mattress may lessen your pain and discomfort. °SEEK IMMEDIATE MEDICAL CARE IF:  °· You lose control of your bowel or bladder (incontinence). °· You have increasing weakness in the lower back, pelvis, buttocks,   or legs.  You have redness or swelling of your back.  You have a burning sensation when you urinate.  You have pain that gets worse when you lie down or awakens you at night.  Your pain is worse than you have experienced in the past.  Your pain is lasting longer than 4 weeks.  You are suddenly losing weight without reason. MAKE SURE YOU:  Understand these  instructions.  Will watch your condition.  Will get help right away if you are not doing well or get worse.   This information is not intended to replace advice given to you by your health care provider. Make sure you discuss any questions you have with your health care provider.   Document Released: 08/28/2001 Document Revised: 05/25/2015 Document Reviewed: 01/13/2012 Elsevier Interactive Patient Education 2016 Elsevier Inc. BACK: Child's Pose (Sciatica)    Sit in knee-chest position and reach arms forward. Separate knees for comfort. Hold position for ___ breaths. Repeat __3_ times. Do _3_ times per day.  Copyright  VHI. All rights reserved.  BACK: Knee-Chest (Sciatica)    Sit on heels, separate knees for comfort and rest forehead on hands. Imagine breathing into the spaces of your back and rib cage. Hold position for _3_ breaths. Repeat __3_ times. Do _3__ times per day.  Copyright  VHI. All rights reserved.

## 2016-01-07 NOTE — MAU Provider Note (Signed)
History    Paralee CancelFerrice J Tesoriero is a 30y.o. [id number] at 33.6wks who presents, unannounced, for back pain and left leg "keeps giving out."  Patient describes back pain as a stabbing pain that is intermittent, but has worsened today.  Patient unable to describe leg pain and states "it just keeps going out on me."  Patient reports taking 2 advil for the pain around 2030 and 1 advil at 2300. Patient states she has also tried warm bath without relief.  Patient denies issues with urination, constipation, or diarrhea.  Patient endorses active fetus and denies LOF or VB.   Patient Active Problem List   Diagnosis Date Noted  . Previous cesarean section x 3 12/19/2015  . H/O premature delivery x 2--one spontaneous, one induced 12/19/2015  . 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 History of prior pregnancy with IUGR newborn 12/19/2015  . Anemia 12/19/2015    Chief Complaint  Patient presents with  . Back Pain  . Abdominal Pain   HPI  OB History    Gravida Para Term Preterm AB TAB SAB Ectopic Multiple Living   8 4 2 2 3  3   4       Past Medical History  Diagnosis Date  . Medical history non-contributory     Past Surgical History  Procedure Laterality Date  . Cesarean section    . Cesarean section N/A 11/23/2012    Procedure: CESAREAN SECTION;  Surgeon: Kathreen CosierBernard A Marshall, MD;  Location: [facility];  Service: Obstetrics;  Laterality: N/A;  Repeat Cesarean Section Delivery Baby    @      , Apgars   . Dilation and evacuation N/A 04/30/2014    Procedure: DILATATION AND EVACUATION;  Surgeon: Kathreen CosierBernard A Marshall, MD;  Location: [facility];  Service: Gynecology;  Laterality: N/A;    Family History  Problem Relation Age of Onset  . Asthma Father   . Asthma Brother     Social History  Substance Use Topics  . Smoking status: Former Smoker -- 0.25 packs/day  . Smokeless tobacco: None  . Alcohol Use: No    Allergies:  Allergies  Allergen Reactions  . Latex  Itching    Prescriptions prior to admission  Medication Sig Dispense Refill Last Dose  . ferrous sulfate 325 (65 FE) MG tablet Take 325 mg by mouth daily.  1 01/06/2016 at Unknown time  . ranitidine (ZANTAC) 150 MG tablet Take 150 mg by mouth 2 (two) times daily as needed for heartburn.   1 01/06/2016 at Unknown time  . metroNIDAZOLE (METROGEL VAGINAL) 0.75 % vaginal gel Place 1 Applicatorful vaginally at bedtime. Insert one applicator, at bedtime, for 5 nights. 70 g 0   . ondansetron (ZOFRAN ODT) 4 MG disintegrating tablet Take 1 tablet (4 mg total) by mouth every 8 (eight) hours as needed for nausea or vomiting. 20 tablet 0 Past Week at Unknown time  . oxyCODONE-acetaminophen (ROXICET) 5-325 MG tablet Take 1-2 tablets by mouth every 6 (six) hours as needed. 14 tablet 0   . promethazine (PHENERGAN) 12.5 MG tablet Take 12.5 mg by mouth every 6 (six) hours as needed for nausea.   1 Past Week at Unknown time    ROS  See HPI Above Physical Exam   Blood pressure 97/82, pulse 82, temperature 98.1 F (36.7 C), resp. rate 18, height 5\' 4"  (1.626 m), weight 84.823 kg (187 lb), last menstrual period 05/15/2015, unknown if currently breastfeeding.  No results found  for this or any previous visit (from the past 24 hour(s)).  Physical Exam  Constitutional: She is oriented to person, place, and time. She appears well-developed and well-nourished.  HENT:  Head: Normocephalic and atraumatic.  Eyes: Conjunctivae are normal.  Neck: Normal range of motion.  Cardiovascular: Normal rate.   Respiratory: Effort normal.  GI: Soft.  Genitourinary: Rectal exam shows external hemorrhoid. Cervix exhibits discharge. Cervix exhibits no motion tenderness. No bleeding in the vagina. Vaginal discharge found.  Sterile Speculum Exam: -Introitus: White discharge noted  -Vaginal Vault: Thick white, curdy discharge -wet prep collected -Cervix:Thick mucoid discharge from os-GC/CT collected -Bimanual Exam: 1cm  external/Closed Internal/Soft/Ballotable  Musculoskeletal: Normal range of motion. She exhibits no edema.  Neurological: She is alert and oriented to person, place, and time.  Skin: Skin is warm and dry.     FHR:120 bpm, Mod Var, -Decels, +Accels UC: Mild Intermittent ED Course  Assessment: IUP at 33.6wks Cat I FT Back Pain Sciatica Pain  Plan: -PE as above -Labs: fFN, Wet Prep, GC/CT -Discussed sciatica pain and comfort measures -Flexeril  and Tylenol XR now -Heating Pad -Await results -Report given to R. Pattye Meda for follow up and completion  Follow Up (Time) - -   Cherre Robins CNM, MSN 01/07/2016 6:30 AM   Follow Up:  Results for orders placed or performed during the hospital encounter of 01/07/16 (from the past 24 hour(s))  Fetal fibronectin     Status: None   Collection Time: 01/07/16  6:45 AM  Result Value Ref Range   Fetal Fibronectin NEGATIVE NEGATIVE  Wet prep, genital     Status: Abnormal   Collection Time: 01/07/16  6:45 AM  Result Value Ref Range   Yeast Wet Prep HPF POC NONE SEEN NONE SEEN   Trich, Wet Prep NONE SEEN NONE SEEN   Clue Cells Wet Prep HPF POC NONE SEEN NONE SEEN   WBC, Wet Prep HPF POC FEW (A) NONE SEEN   Sperm NONE SEEN   Urinalysis, Routine w reflex microscopic (not at [facility])     Status: Abnormal   Collection Time: 01/07/16  6:55 AM  Result Value Ref Range   Color, Urine YELLOW YELLOW   APPearance CLEAR CLEAR   Specific Gravity, Urine 1.015 1.005 - 1.030   pH 6.5 5.0 - 8.0   Glucose, UA NEGATIVE NEGATIVE mg/dL   Hgb urine dipstick TRACE (A) NEGATIVE   Bilirubin Urine SMALL (A) NEGATIVE   Ketones, ur 15 (A) NEGATIVE mg/dL   Protein, ur 30 (A) NEGATIVE mg/dL   Nitrite NEGATIVE NEGATIVE   Leukocytes, UA SMALL (A) NEGATIVE  Urine microscopic-add on     Status: Abnormal   Collection Time: 01/07/16  6:55 AM  Result Value Ref Range   Squamous Epithelial / LPF 0-5 (A) NONE SEEN   WBC, UA 0-5 0 - 5 WBC/hpf   RBC / HPF 0-5 0 - 5  RBC/hpf   Bacteria, UA FEW (A) NONE SEEN   Urine-Other MUCOUS PRESENT    FHT:  FHT:  Cat 1 tracing, FHt 120, +accels, no decels UC: No contractions  Assessment Ffn negative Wet prep negative Dehydration, +15 ketones Sciatica pain, left side Cat 1 tracing   Plan DC home RX Flexeril Rx tylenol Increase water intake Rest, heat and stretching  Instructions for stretching (Knee to Chest, Child Pose) 3 times daily Call PRN Go to scheduled prenatal visit  Alphonzo Severance, [facility] : [facility] 01/07/16 1610

## 2016-01-09 LAB — GC/CHLAMYDIA PROBE AMP (~~LOC~~) NOT AT ARMC
Chlamydia: NEGATIVE
Neisseria Gonorrhea: NEGATIVE

## 2016-01-16 ENCOUNTER — Other Ambulatory Visit: Payer: Self-pay | Admitting: Obstetrics and Gynecology

## 2016-01-22 ENCOUNTER — Encounter (HOSPITAL_COMMUNITY): Payer: Self-pay

## 2016-01-22 ENCOUNTER — Inpatient Hospital Stay (HOSPITAL_COMMUNITY)
Admission: AD | Admit: 2016-01-22 | Discharge: 2016-01-22 | Disposition: A | Discharge status: Home / Self Care | Payer: Medicaid Other | Source: Ambulatory Visit | Attending: Obstetrics and Gynecology | Admitting: Obstetrics and Gynecology

## 2016-01-22 DIAGNOSIS — M545 Low back pain: Secondary | ICD-10-CM | POA: Diagnosis not present

## 2016-01-22 DIAGNOSIS — M5432 Sciatica, left side: Secondary | ICD-10-CM | POA: Diagnosis not present

## 2016-01-22 DIAGNOSIS — R102 Pelvic and perineal pain: Secondary | ICD-10-CM | POA: Insufficient documentation

## 2016-01-22 DIAGNOSIS — O99013 Anemia complicating pregnancy, third trimester: Secondary | ICD-10-CM | POA: Insufficient documentation

## 2016-01-22 DIAGNOSIS — N949 Unspecified condition associated with female genital organs and menstrual cycle: Secondary | ICD-10-CM

## 2016-01-22 DIAGNOSIS — Z87891 Personal history of nicotine dependence: Secondary | ICD-10-CM | POA: Insufficient documentation

## 2016-01-22 DIAGNOSIS — Z91040 Latex allergy status: Secondary | ICD-10-CM | POA: Insufficient documentation

## 2016-01-22 DIAGNOSIS — O34211 Maternal care for low transverse scar from previous cesarean delivery: Secondary | ICD-10-CM | POA: Diagnosis not present

## 2016-01-22 DIAGNOSIS — Z3A36 36 weeks gestation of pregnancy: Secondary | ICD-10-CM | POA: Diagnosis not present

## 2016-01-22 DIAGNOSIS — D649 Anemia, unspecified: Secondary | ICD-10-CM | POA: Diagnosis not present

## 2016-01-22 DIAGNOSIS — O26893 Other specified pregnancy related conditions, third trimester: Secondary | ICD-10-CM | POA: Insufficient documentation

## 2016-01-22 DIAGNOSIS — R1030 Lower abdominal pain, unspecified: Secondary | ICD-10-CM | POA: Diagnosis present

## 2016-01-22 LAB — URINALYSIS, ROUTINE W REFLEX MICROSCOPIC
BILIRUBIN URINE: NEGATIVE
Glucose, UA: NEGATIVE mg/dL
Ketones, ur: NEGATIVE mg/dL
NITRITE: NEGATIVE
PH: 6.5 (ref 5.0–8.0)
Protein, ur: NEGATIVE mg/dL
SPECIFIC GRAVITY, URINE: 1.015 (ref 1.005–1.030)

## 2016-01-22 LAB — URINE MICROSCOPIC-ADD ON: RBC / HPF: NONE SEEN RBC/hpf (ref 0–5)

## 2016-01-22 MED ORDER — OXYCODONE-ACETAMINOPHEN 5-325 MG PO TABS: 1.0000 | ORAL_TABLET | Freq: Four times a day (QID) | ORAL | Status: DC | PRN

## 2016-01-22 MED ORDER — OXYCODONE-ACETAMINOPHEN 5-325 MG PO TABS
1.0000 | ORAL_TABLET | ORAL | Status: DC | PRN
  Administered 2016-01-22: 1 via ORAL
  Filled 2016-01-22: qty 1

## 2016-01-22 NOTE — MAU Note (Signed)
Patient presents with lower abdominal pain and back pain since yesterday, denies vaginal bleeding or LOF, positive FM

## 2016-01-22 NOTE — MAU Note (Signed)
Report given to Alphonzo Severanceachel Stall CNM patient presents with lower abdominal pain and back pain since yesterday, denies LOF, denies vaginal bleeding or discharge, 1 cm in office Thursday

## 2016-01-22 NOTE — MAU Provider Note (Signed)
Lauren Pineda is a 30 yo, [id number], at 36.0 wks presenting to MAU, unannounced, for lower abdominal and lower back pain since yesterday. Pt tearful and distressed regarding discomfort.  Reports occasional braxton hicks contractions and denies lof, denies vaginal bleeding or discharge. Found to be dilated 1/50/-4 cm at Prenatal visit on 01/19/16 with similar complaints of back pain and lower abdominal pressure. Physical therapy recommended and declined by patient.  Previously seen in [facility]U for back pain and pelvic pressure on 12/19/15 and sciatic pain on 01/07/16, Rx Percocet on 12/19/15 and Flexeril, Tylenol on 01/07/16. 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 Next prenatal [facility]/16 at [facility].  History significant for previous cesarean sections x3. Repeat cesarean section scheduled for 02/15/16.    History     Patient Active Problem List   Diagnosis Date Noted  . Sciatica of left side 01/07/2016  . Previous cesarean section x 3 12/19/2015  . H/O premature delivery x 2--one spontaneous, one induced 12/19/2015  . 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 History of prior pregnancy with IUGR newborn 12/19/2015  . Anemia 12/19/2015    Chief Complaint  Patient presents with  . Abdominal Pain  . Back Pain   HPI  OB History    Gravida Para Term Preterm AB TAB SAB Ectopic Multiple Living   Past Medical History  Diagnosis Date  . Medical history non-contributory     Past Surgical History  Procedure Laterality Date  . Cesarean section    . Cesarean section N/A 11/23/2012    Procedure: CESAREAN SECTION;  Surgeon: Kathreen Cosier, MD;  Location: [facility];  Service: Obstetrics;  Laterality: N/A;  Repeat Cesarean Section Delivery Baby    @      , Apgars   . Dilation and evacuation N/A 04/30/2014    Procedure: DILATATION AND EVACUATION;   Surgeon: Kathreen Cosier, MD;  Location: [facility];  Service: Gynecology;  Laterality: N/A;    Family History  Problem Relation Age of Onset  . Asthma Father   . Asthma Brother     Social History  Substance Use Topics  . Smoking status: Former Smoker -- 0.25 packs/day  . Smokeless tobacco: None  . Alcohol Use: No    Allergies:  Allergies  Allergen Reactions  . Latex Itching    Prescriptions prior to admission  Medication Sig Dispense Refill Last Dose  . cyclobenzaprine (FLEXERIL) 10 MG tablet Take 1 tablet (10 mg total) by mouth every 8 (eight) hours as needed for muscle spasms. 30 tablet 1 Past Week at Unknown time  . ferrous sulfate 325 (65 FE) MG tablet Take 325 mg by mouth daily.  1 Past Week at Unknown time  . ranitidine (ZANTAC) 150 MG tablet Take 150 mg by mouth 2 (two) times daily as needed for heartburn.   1 Past Week at Unknown time  . acetaminophen (TYLENOL) 325 MG tablet Take 2 tablets (650 mg total) by mouth every 6 (six) hours as needed. (Patient not taking: Reported on 01/22/2016) 30 tablet 2   . oxyCODONE-acetaminophen (ROXICET) 5-325 MG tablet Take 1-2 tablets by mouth every 6 (six) hours as needed. (Patient not taking: Reported on 01/07/2016) 14 tablet 0     ROS Physical Exam   Blood pressure 117/68,  temperature 98.4 F (36.9 C), temperature source Oral, resp. rate 93, height 5\' 4"  (1.626 m), weight 85.367 kg (188 lb 3.2 oz), last menstrual period 05/15/2015, unknown if currently breastfeeding.  Filed Vitals:   01/22/16 1150 01/22/16 1156 01/22/16 1157  BP:   117/68  Temp:  98.4 F (36.9 C)   TempSrc:  Oral   Resp:  18 93  Height: 5\' 4"  (1.626 m)    Weight: 85.367 kg (188 lb 3.2 oz)      Dilation: 1 Effacement (%): 50 Cervical Position: Posterior Station: -3 Exam by:: Alphonzo Severanceachel Shantera Monts CNM  FHT:  Cat 1, 125-130 bpm, moderate variability, +accels, no decels UC: none  Results for orders placed or performed during the hospital encounter of 01/22/16  (from the past 24 hour(s))  Urinalysis, Routine w reflex microscopic (not [facility])     Status: Abnormal   Collection Time: 01/22/16 11:47 AM  Result Value Ref Range   Color, Urine YELLOW YELLOW   APPearance HAZY (A) CLEAR   Specific Gravity, Urine 1.015 1.005 - 1.030   pH 6.5 5.0 - 8.0   Glucose, UA NEGATIVE NEGATIVE mg/dL   Hgb urine dipstick TRACE (A) NEGATIVE   Bilirubin Urine NEGATIVE NEGATIVE   Ketones, ur NEGATIVE NEGATIVE mg/dL   Protein, ur NEGATIVE NEGATIVE mg/dL   Nitrite NEGATIVE NEGATIVE   Leukocytes, UA TRACE (A) NEGATIVE  Urine microscopic-add on     Status: Abnormal   Collection Time: 01/22/16 11:47 AM  Result Value Ref Range   Squamous Epithelial / LPF 6-30 (A) NONE SEEN   WBC, UA 0-5 0 - 5 WBC/hpf   RBC / HPF NONE SEEN 0 - 5 RBC/hpf   Bacteria, UA MANY (A) NONE SEEN    Physical Exam  Constitutional: She is oriented to person, place, and time. She appears well-developed and well-nourished.  HENT:  Head: Normocephalic and atraumatic.  Eyes: Pupils are equal, round, and reactive to light.  Neck: Normal range of motion.  Cardiovascular: Normal rate and regular rhythm.   Respiratory: Effort normal and breath sounds normal.  GI: Soft.  Genitourinary: Vagina normal.  Musculoskeletal: Normal range of motion.  Neurological: She is alert and oriented to person, place, and time.  Skin: Skin is warm and dry.  Psychiatric: Her behavior is normal.  Tearful    ED Course  Assessment: IUP at 36.0 wks Round ligament pain Low back pain in pregnancy No contractions Cervix unchanged, 1/50/-3 from 01/19/16 H/o LTCsx3, Repeat  LTCS scheduled 02/15/16  Cat 1 FT  Plan: Discussed common discomforts of pregnancy including round ligament pain, pelvic pressure, and lower back pain due to baby position and advancement of pregnancy  -Tylenol, Flexeril recommended  -Percocet, 1 tab, Now  Rx percocet 14 tabs, No refills Discharge home in stable condition  Alphonzo SeveranceRachel Bristal Steffy CNM,  MSN 01/22/2016 12:24 PM

## 2016-01-22 NOTE — Discharge Instructions (Signed)
Round Ligament Pain The round ligament is a cord of muscle and tissue that helps to support the uterus. It can become a source of pain during pregnancy if it becomes stretched or twisted as the baby grows. The pain usually begins in the second trimester of pregnancy, and it can come and go until the baby is delivered. It is not a serious problem, and it does not cause harm to the baby. Round ligament pain is usually a short, sharp, and pinching pain, but it can also be a dull, lingering, and aching pain. The pain is felt in the lower side of the abdomen or in the groin. It usually starts deep in the groin and moves up to the outside of the hip area. Pain can occur with:  A sudden change in position.  Rolling over in bed.  Coughing or sneezing.  Physical activity. HOME CARE INSTRUCTIONS Watch your condition for any changes. Take these steps to help with your pain:  When the pain starts, relax. Then try:  Sitting down.  Flexing your knees up to your abdomen.  Lying on your side with one pillow under your abdomen and another pillow between your legs.  Sitting in a warm bath for 15-20 minutes or until the pain goes away.  Take over-the-counter and prescription medicines only as told by your health care provider.  Move slowly when you sit and stand.  Avoid long walks if they cause pain.  Stop or lessen your physical activities if they cause pain. SEEK MEDICAL CARE IF:  Your pain does not go away with treatment.  You feel pain in your back that you did not have before.  Your medicine is not helping. SEEK IMMEDIATE MEDICAL CARE IF:  You develop a fever or chills.  You develop uterine contractions.  You develop vaginal bleeding.  You develop nausea or vomiting.  You develop diarrhea.  You have pain when you urinate.   This information is not intended to replace advice given to you by your health care provider. Make sure you discuss any questions you have with your health  care provider.   Document Released: 06/12/2008 Document Revised: 11/26/2011 Document Reviewed: 11/10/2014 Elsevier Interactive Patient Education [employer]    This information is not intended to replace advice given to you by your health care provider. Make sure you discuss any questions you have with your health care provider.   Document Released: 08/28/2001 Document Revised: 05/25/2015 Document Reviewed: 01/13/2012 Elsevier Interactive Patient Education 2016 Elsevier Inc.  Back Pain in Pregnancy Back pain during pregnancy is common. It happens in about half of all pregnancies. It is important for you and your baby that you remain active during your pregnancy.If you feel that back pain is not allowing you to remain active or sleep well, it is time to see your caregiver. Back pain may be caused by several factors related to changes during your pregnancy.Fortunately, unless you had trouble with your back before your pregnancy, the pain is likely to get better after you deliver. Low back pain usually occurs between the fifth and seventh months of pregnancy. It can, however, happen in the first couple months. Factors that increase the risk of back problems include:   Previous back problems.  Injury to your back.  Having twins or multiple births.  A chronic cough.  Stress.  Job-related repetitive motions.  Muscle or spinal disease in the back.  Family history of back problems, ruptured (herniated) discs, or osteoporosis.  Depression, anxiety, and  panic attacks. CAUSES   When you are pregnant, your body produces a hormone called relaxin. This hormonemakes the ligaments connecting the low back and pubic bones more flexible. This flexibility allows the baby to be delivered more easily. When your ligaments are loose, your muscles need to work harder to support your back. Soreness in your back can come from tired muscles. Soreness can also come from back tissues that are  irritated since they are receiving less support.  As the baby grows, it puts pressure on the nerves and blood vessels in your pelvis. This can cause back pain.  As the baby grows and gets heavier during pregnancy, the uterus pushes the stomach muscles forward and changes your center of gravity. This makes your back muscles work harder to maintain good posture. SYMPTOMS  Lumbar pain during pregnancy Lumbar pain during pregnancy usually occurs at or above the waist in the center of the back. There may be pain and numbness that radiates into your leg or foot. This is similar to low back pain experienced by non-pregnant women. It usually increases with sitting for long periods of time, standing, or repetitive lifting. Tenderness may also be present in the muscles along your upper back. Posterior pelvic pain during pregnancy Pain in the back of the pelvis is more common than lumbar pain in pregnancy. It is a deep pain felt in your side at the waistline, or across the tailbone (sacrum), or in both places. You may have pain on one or both sides. This pain can also go into the buttocks and backs of the upper thighs. Pubic and groin pain may also be present. The pain does not quickly resolve with rest, and morning stiffness may also be present. Pelvic pain during pregnancy can be brought on by most activities. A high level of fitness before and during pregnancy may or may not prevent this problem. Labor pain is usually 1 to 2 minutes apart, lasts for about 1 minute, and involves a bearing down feeling or pressure in your pelvis. However, if you are at term with the pregnancy, constant low back pain can be the beginning of early labor, and you should be aware of this. DIAGNOSIS  X-rays of the back should not be done during the first 12 to 14 weeks of the pregnancy and only when absolutely necessary during the rest of the pregnancy. MRIs do not give off radiation and are safe during pregnancy. MRIs also should only  be done when absolutely necessary. HOME CARE INSTRUCTIONS  Exercise as directed by your caregiver. Exercise is the most effective way to prevent or manage back pain. If you have a back problem, it is especially important to avoid sports that require sudden body movements. Swimming and walking are great activities.  Do not stand in one place for long periods of time.  Do not wear high heels.  Sit in chairs with good posture. Use a pillow on your lower back if necessary. Make sure your head rests over your shoulders and is not hanging forward.  Try sleeping on your side, preferably the left side, with a pillow or two between your legs. If you are sore after a night's rest, your bedmay betoo soft.Try placing a board between your mattress and box spring.  Listen to your body when lifting.If you are experiencing pain, ask for help or try bending yourknees more so you can use your leg muscles rather than your back muscles. Squat down when picking up something from the floor. Do  not bend over.  Eat a healthy diet. Try to gain weight within your caregiver's recommendations.  Use heat or cold packs 3 to 4 times a day for 15 minutes to help with the pain.  Only take over-the-counter or prescription medicines for pain, discomfort, or fever as directed by your caregiver. Sudden (acute) back pain  Use bed rest for only the most extreme, acute episodes of back pain. Prolonged bed rest over 48 hours will aggravate your condition.  Ice is very effective for acute conditions.  Put ice in a plastic bag.  Place a towel between your skin and the bag.  Leave the ice on for 10 to 20 minutes every 2 hours, or as needed.  Using heat packs for 30 minutes prior to activities is also helpful. Continued back pain See your caregiver if you have continued problems. Your caregiver can help or refer you for appropriate physical therapy. With conditioning, most back problems can be avoided. Sometimes, a more  serious issue may be the cause of back pain. You should be seen right away if new problems seem to be developing. Your caregiver may recommend:  A maternity girdle.  An elastic sling.  A back brace.  A massage therapist or acupuncture. SEEK MEDICAL CARE IF:   You are not able to do most of your daily activities, even when taking the pain medicine you were given.  You need a referral to a physical therapist or chiropractor.  You want to try acupuncture. SEEK IMMEDIATE MEDICAL CARE IF:  You develop numbness, tingling, weakness, or problems with the use of your arms or legs.  You develop severe back pain that is no longer relieved with medicines.  You have a sudden change in bowel or bladder control.  You have increasing pain in other areas of the body.  You develop shortness of breath, dizziness, or fainting.  You develop nausea, vomiting, or sweating.  You have back pain which is similar to labor pains.  You have back pain along with your water breaking or vaginal bleeding.  You have back pain or numbness that travels down your leg.  Your back pain developed after you fell.  You develop pain on one side of your back. You may have a kidney stone.  You see blood in your urine. You may have a bladder infection or kidney stone.  You have back pain with blisters. You may have shingles. Back pain is fairly common during pregnancy but should not be accepted as just part of the process. Back pain should always be treated as soon as possible. This will make your pregnancy as pleasant as possible.   This information is not intended to replace advice given to you by your health care provider. Make sure you discuss any questions you have with your health care provider.   Document Released: 12/12/2005 Document Revised: 11/26/2011 Document Reviewed: 01/23/2011 Elsevier Interactive Patient Education [employer]  Third Trimester of Pregnancy The third trimester is from week  29 through week 42, months 7 through 9. This trimester is when your unborn baby (fetus) is growing very fast. At the end of the ninth month, the unborn baby is about 20 inches in length. It weighs about 6-10 pounds.  HOME CARE   Avoid all smoking, herbs, and alcohol. Avoid drugs not approved by your doctor.  Do not use any tobacco products, including cigarettes, chewing tobacco, and electronic cigarettes. If you need help quitting, ask your doctor. You may get counseling or other  support to help you quit.  Only take medicine as told by your doctor. Some medicines are safe and some are not during pregnancy.  Exercise only as told by your doctor. Stop exercising if you start having cramps.  Eat regular, healthy meals.  Wear a good support bra if your breasts are tender.  Do not use hot tubs, steam rooms, or saunas.  Wear your seat belt when driving.  Avoid raw meat, uncooked cheese, and liter boxes and soil used by cats.  Take your prenatal vitamins.  Take 1500-2000 milligrams of calcium daily starting at the 20th week of pregnancy until you deliver your baby.  Try taking medicine that helps you poop (stool softener) as needed, and if your doctor approves. Eat more fiber by eating fresh fruit, vegetables, and whole grains. Drink enough fluids to keep your pee (urine) clear or pale yellow.  Take warm water baths (sitz baths) to soothe pain or discomfort caused by hemorrhoids. Use hemorrhoid cream if your doctor approves.  If you have puffy, bulging veins (varicose veins), wear support hose. Raise (elevate) your feet for 15 minutes, 3-4 times a day. Limit salt in your diet.  Avoid heavy lifting, wear low heels, and sit up straight.  Rest with your legs raised if you have leg cramps or low back pain.  Visit your dentist if you have not gone during your pregnancy. Use a soft toothbrush to brush your teeth. Be gentle when you floss.  You can have sex (intercourse) unless your doctor  tells you not to.  Do not travel far distances unless you must. Only do so with your doctor's approval.  Take prenatal classes.  Practice driving to the hospital.  Pack your hospital bag.  Prepare the baby's room.  Go to your doctor visits. GET HELP IF:  You are not sure if you are in labor or if your water has broken.  You are dizzy.  You have mild cramps or pressure in your lower belly (abdominal).  You have a nagging pain in your belly area.  You continue to feel sick to your stomach (nauseous), throw up (vomit), or have watery poop (diarrhea).  You have bad smelling fluid coming from your vagina.  You have pain with peeing (urination). GET HELP RIGHT AWAY IF:   You have a fever.  You are leaking fluid from your vagina.  You are spotting or bleeding from your vagina.  You have severe belly cramping or pain.  You lose or gain weight rapidly.  You have trouble catching your breath and have chest pain.  You notice sudden or extreme puffiness (swelling) of your face, hands, ankles, feet, or legs.  You have not felt the baby move in over an hour.  You have severe headaches that do not go away with medicine.  You have vision changes.   This information is not intended to replace advice given to you by your health care provider. Make sure you discuss any questions you have with your health care provider.   Document Released: 11/28/2009 Document Revised: 09/24/2014 Document Reviewed: 11/04/2012 Elsevier Interactive Patient Education [employer]

## 2016-01-29 ENCOUNTER — Inpatient Hospital Stay (HOSPITAL_COMMUNITY)
Admission: AD | Admit: 2016-01-29 | Discharge: 2016-01-29 | Disposition: A | Discharge status: Home / Self Care | Payer: Medicaid Other | Source: Ambulatory Visit | Attending: Obstetrics and Gynecology | Admitting: Obstetrics and Gynecology

## 2016-01-29 ENCOUNTER — Encounter (HOSPITAL_COMMUNITY): Payer: Self-pay

## 2016-01-29 DIAGNOSIS — O26893 Other specified pregnancy related conditions, third trimester: Secondary | ICD-10-CM | POA: Insufficient documentation

## 2016-01-29 DIAGNOSIS — O34219 Maternal care for unspecified type scar from previous cesarean delivery: Secondary | ICD-10-CM | POA: Insufficient documentation

## 2016-01-29 DIAGNOSIS — O9989 Other specified diseases and conditions complicating pregnancy, childbirth and the puerperium: Secondary | ICD-10-CM

## 2016-01-29 DIAGNOSIS — Z87891 Personal history of nicotine dependence: Secondary | ICD-10-CM | POA: Diagnosis not present

## 2016-01-29 DIAGNOSIS — Z91040 Latex allergy status: Secondary | ICD-10-CM | POA: Insufficient documentation

## 2016-01-29 DIAGNOSIS — M549 Dorsalgia, unspecified: Secondary | ICD-10-CM | POA: Diagnosis not present

## 2016-01-29 DIAGNOSIS — Z3A37 37 weeks gestation of pregnancy: Secondary | ICD-10-CM | POA: Diagnosis not present

## 2016-01-29 DIAGNOSIS — O99891 Other specified diseases and conditions complicating pregnancy: Secondary | ICD-10-CM

## 2016-01-29 LAB — URINALYSIS, ROUTINE W REFLEX MICROSCOPIC
BILIRUBIN URINE: NEGATIVE
Glucose, UA: NEGATIVE mg/dL
KETONES UR: 15 mg/dL — AB
NITRITE: NEGATIVE
PROTEIN: NEGATIVE mg/dL
Specific Gravity, Urine: >1.03 — ABNORMAL HIGH (ref 1.005–1.030)
pH: 6 (ref 5.0–8.0)

## 2016-01-29 LAB — URINE MICROSCOPIC-ADD ON

## 2016-01-29 MED ORDER — MORPHINE SULFATE (PF) 10 MG/ML IV SOLN
10.0000 mg | Freq: Once | INTRAVENOUS | Status: AC
  Administered 2016-01-29: 10 mg via INTRAMUSCULAR
  Filled 2016-01-29: qty 1

## 2016-01-29 MED ORDER — ONDANSETRON 8 MG PO TBDP
8.0000 mg | ORAL_TABLET | Freq: Once | ORAL | Status: AC
  Administered 2016-01-29: 8 mg via ORAL
  Filled 2016-01-29: qty 1

## 2016-01-29 NOTE — Discharge Instructions (Signed)
Abdominal Pain During Pregnancy Belly (abdominal) pain is common during pregnancy. Most of the time, it is not a serious problem. Other times, it can be a sign that something is wrong with the pregnancy. Always tell your doctor if you have belly pain. HOME CARE Monitor your belly pain for any changes. The following actions may help you feel better:  Do not have sex (intercourse) or put anything in your vagina until you feel better.  Rest until your pain stops.  Drink clear fluids if you feel sick to your stomach (nauseous). Do not eat solid food until you feel better.  Only take medicine as told by your doctor.  Keep all doctor visits as told. GET HELP RIGHT AWAY IF:   You are bleeding, leaking fluid, or pieces of tissue come out of your vagina.  You have more pain or cramping.  You keep throwing up (vomiting).  You have pain when you pee (urinate) or have blood in your pee.  You have a fever.  You do not feel your baby moving as much.  You feel very weak or feel like passing out.  You have trouble breathing, with or without belly pain.  You have a very bad headache and belly pain.  You have fluid leaking from your vagina and belly pain.  You keep having watery poop (diarrhea).  Your belly pain does not go away after resting, or the pain gets worse. MAKE SURE YOU:   Understand these instructions.  Will watch your condition.  Will get help right away if you are not doing well or get worse.   This information is not intended to replace advice given to you by your health care provider. Make sure you discuss any questions you have with your health care provider.   Document Released: 08/22/2009 Document Revised: 05/06/2013 Document Reviewed: 04/02/2013 Elsevier Interactive Patient Education 2016 [place].  Back Exercises If you have pain in your back, do these exercises 2-3 times each day or as told by your doctor. When the pain goes away, do the exercises once  each day, but repeat the steps more times for each exercise (do more repetitions). If you do not have pain in your back, do these exercises once each day or as told by your doctor. EXERCISES Single Knee to Chest Do these steps 3-5 times in a row for each leg:  Lie on your back on a firm bed or the floor with your legs stretched out.  Bring one knee to your chest.  Hold your knee to your chest by grabbing your knee or thigh.  Pull on your knee until you feel a gentle stretch in your lower back.  Keep doing the stretch for 10-30 seconds.  Slowly let go of your leg and straighten it. Pelvic Tilt Do these steps 5-10 times in a row:  Lie on your back on a firm bed or the floor with your legs stretched out.  Bend your knees so they point up to the ceiling. Your feet should be flat on the floor.  Tighten your lower belly (abdomen) muscles to press your lower back against the floor. This will make your tailbone point up to the ceiling instead of pointing down to your feet or the floor.  Stay in this position for 5-10 seconds while you gently tighten your muscles and breathe evenly. Cat-Cow Do these steps until your lower back bends more easily:  Get on your hands and knees on a firm surface. Keep your hands  under your shoulders, and keep your knees under your hips. You may put padding under your knees.  Let your head hang down, and make your tailbone point down to the floor so your lower back is round like the back of a cat.  Stay in this position for 5 seconds.  Slowly lift your head and make your tailbone point up to the ceiling so your back hangs low (sags) like the back of a cow.  Stay in this position for 5 seconds. Press-Ups Do these steps 5-10 times in a row:  Lie on your belly (face-down) on the floor.  Place your hands near your head, about shoulder-width apart.  While you keep your back relaxed and keep your hips on the floor, slowly straighten your arms to raise the  top half of your body and lift your shoulders. Do not use your back muscles. To make yourself more comfortable, you may change where you place your hands.  Stay in this position for 5 seconds.  Slowly return to lying flat on the floor. Bridges Do these steps 10 times in a row:  Lie on your back on a firm surface.  Bend your knees so they point up to the ceiling. Your feet should be flat on the floor.  Tighten your butt muscles and lift your butt off of the floor until your waist is almost as high as your knees. If you do not feel the muscles working in your butt and the back of your thighs, slide your feet 1-2 inches farther away from your butt.  Stay in this position for 3-5 seconds.  Slowly lower your butt to the floor, and let your butt muscles relax. If this exercise is too easy, try doing it with your arms crossed over your chest. Belly Crunches Do these steps 5-10 times in a row:  Lie on your back on a firm bed or the floor with your legs stretched out.  Bend your knees so they point up to the ceiling. Your feet should be flat on the floor.  Cross your arms over your chest.  Tip your chin a little bit toward your chest but do not bend your neck.  Tighten your belly muscles and slowly raise your chest just enough to lift your shoulder blades a tiny bit off of the floor.  Slowly lower your chest and your head to the floor. Back Lifts Do these steps 5-10 times in a row: 1. Lie on your belly (face-down) with your arms at your sides, and rest your forehead on the floor. 2. Tighten the muscles in your legs and your butt. 3. Slowly lift your chest off of the floor while you keep your hips on the floor. Keep the back of your head in line with the curve in your back. Look at the floor while you do this. 4. Stay in this position for 3-5 seconds. 5. Slowly lower your chest and your face to the floor. GET HELP IF:  Your back pain gets a lot worse when you do an exercise.  Your  back pain does not lessen 2 hours after you exercise. If you have any of these problems, stop doing the exercises. Do not do them again unless your doctor says it is okay. GET HELP RIGHT AWAY IF:  You have sudden, very bad back pain. If this happens, stop doing the exercises. Do not do them again unless your doctor says it is okay.   This information is not intended to replace  advice given to you by your health care provider. Make sure you discuss any questions you have with your health care provider.   Document Released: 10/06/2010 Document Revised: 05/25/2015 Document Reviewed: 10/28/2014 Elsevier Interactive Patient Education 2016 [place] Injury Prevention Back injuries can be very painful. They can also be difficult to heal. After having one back injury, you are more likely to injure your back again. It is important to learn how to avoid injuring or re-injuring your back. The following tips can help you to prevent a back injury. WHAT SHOULD I KNOW ABOUT PHYSICAL FITNESS?  Exercise for 30 minutes per day on most days of the week or as directed by your health care provider. Make sure to:  Do aerobic exercises, such as walking, jogging, biking, or swimming.  Do exercises that increase balance and strength, such as tai chi and yoga. These can decrease your risk of falling and injuring your back.  Do stretching exercises to help with flexibility.  Try to develop strong abdominal muscles. Your abdominal muscles provide a lot of the support that is needed by your back.  Maintain a healthy weight. This helps to decrease your risk of a back injury. WHAT SHOULD I KNOW ABOUT MY DIET?  Talk with your health care provider about your overall diet. Take supplements and vitamins only as directed by your health care provider.  Talk with your health care provider about how much calcium and vitamin D you need each day. These nutrients help to prevent weakening of the bones  (osteoporosis). Osteoporosis can cause broken (fractured) bones, which lead to back pain.  Include good sources of calcium in your diet, such as dairy products, green leafy vegetables, and products that have had calcium added to them (fortified).  Include good sources of vitamin D in your diet, such as milk and foods that are fortified with vitamin D. WHAT SHOULD I KNOW ABOUT MY POSTURE?  Sit up straight and stand up straight. Avoid leaning forward when you sit or hunching over when you stand.  Choose chairs that have good low-back (lumbar) support.  If you work at a desk, sit close to it so you do not need to lean over. Keep your chin tucked in. Keep your neck drawn back, and keep your elbows bent at a right angle. Your arms should look like the letter "L."  Sit high and close to the steering wheel when you drive. Add a lumbar support to your car seat, if needed.  Avoid sitting or standing in one position for very long. Take breaks to get up, stretch, and walk around at least one time every hour. Take breaks every hour if you are driving for long periods of time.  Sleep on your side with your knees slightly bent, or sleep on your back with a pillow under your knees. Do not lie on the front of your body to sleep. WHAT SHOULD I KNOW ABOUT LIFTING, TWISTING, AND REACHING? Lifting and Heavy Lifting  Avoid heavy lifting, especially repetitive heavy lifting. If you must do heavy lifting:  Stretch before lifting.  Work slowly.  Rest between lifts.  Use a tool such as a cart or a dolly to move objects if one is available.  Make several small trips instead of carrying one heavy load.  Ask for help when you need it, especially when moving big objects.  Follow these steps when lifting:  Stand with your feet shoulder-width apart.  Get as close to the object as  you can. Do not try to pick up a heavy object that is far from your body.  Use handles or lifting straps if they are  available.  Bend at your knees. Squat down, but keep your heels off the floor.  Keep your shoulders pulled back, your chin tucked in, and your back straight.  Lift the object slowly while you tighten the muscles in your legs, abdomen, and buttocks. Keep the object as close to the center of your body as possible.  Follow these steps when putting down a heavy load:  Stand with your feet shoulder-width apart.  Lower the object slowly while you tighten the muscles in your legs, abdomen, and buttocks. Keep the object as close to the center of your body as possible.  Keep your shoulders pulled back, your chin tucked in, and your back straight.  Bend at your knees. Squat down, but keep your heels off the floor.  Use handles or lifting straps if they are available. Twisting and Reaching  Avoid lifting heavy objects above your waist.  Do not twist at your waist while you are lifting or carrying a load. If you need to turn, move your feet.  Do not bend over without bending at your knees.  Avoid reaching over your head, across a table, or for an object on a high surface. WHAT ARE SOME OTHER TIPS?  Avoid wet floors and icy ground. Keep sidewalks clear of ice to prevent falls.  Do not sleep on a mattress that is too soft or too hard.  Keep items that are used frequently within easy reach.  Put heavier objects on shelves at waist level, and put lighter objects on lower or higher shelves.  Find ways to decrease your stress, such as exercise, massage, or relaxation techniques. Stress can build up in your muscles. Tense muscles are more vulnerable to injury.  Talk with your health care provider if you feel anxious or depressed. These conditions can make back pain worse.  Wear flat heel shoes with cushioned soles.  Avoid sudden movements.  Use both shoulder straps when carrying a backpack.  Do not use any tobacco products, including cigarettes, chewing tobacco, or electronic cigarettes.  If you need help quitting, ask your health care provider.   This information is not intended to replace advice given to you by your health care provider. Make sure you discuss any questions you have with your health care provider.   Document Released: 10/11/2004 Document Revised: 01/18/2015 Document Reviewed: 09/07/2014 Elsevier Interactive Patient Education [employer].

## 2016-01-29 NOTE — MAU Provider Note (Signed)
History  Lauren Pineda is a 30 yo [phone] [id number] @ 37.0 wks who presents unannounced to MAU w/ a plethora of complaints, mostly ongoing lower abdominal & back pain. States po meds (Flexeril and Percocet) are ineffective, and "I am tired of taking pills." Per [facility] record on 01/19/16, pt declined physical therapy referral. +FM. Denies VB, LOF or urinary sxs.   Patient Active Problem List   Diagnosis Date Noted  . Back pain affecting pregnancy 01/29/2016  . Sciatica of left side 01/07/2016  . Previous cesarean section x 3 12/19/2015  . H/O premature delivery x 2--one spontaneous, one induced 12/19/2015  . 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 History of prior pregnancy with IUGR newborn 12/19/2015  . Anemia 12/19/2015    Chief Complaint  Patient presents with  . Headache  . Leg Pain  . Back Pain   HPI  As above  OB History    Gravida Para Term Preterm AB TAB SAB Ectopic Multiple Living   8 4 2 2 3  3   4       Past Medical History  Diagnosis Date  . Medical history non-contributory     Past Surgical History  Procedure Laterality Date  . Cesarean section    . Cesarean section N/A 11/23/2012    Procedure: CESAREAN SECTION;  Surgeon: Kathreen CosierBernard A Marshall, MD;  Location: [facility];  Service: Obstetrics;  Laterality: N/A;  Repeat Cesarean Section Delivery Baby    @      , Apgars   . Dilation and evacuation N/A 04/30/2014    Procedure: DILATATION AND EVACUATION;  Surgeon: Kathreen CosierBernard A Marshall, MD;  Location: [facility];  Service: Gynecology;  Laterality: N/A;    Family History  Problem Relation Age of Onset  . Asthma Father   . Asthma Brother     Social History  Substance Use Topics  . Smoking status: Former Smoker -- 0.25 packs/day  . Smokeless tobacco: None  . Alcohol Use: No    Allergies:  Allergies  Allergen Reactions  . Latex Itching    No prescriptions prior to admission    ROS  As per HPI Physical Exam   Blood pressure  118/76, pulse 83, temperature 98 F (36.7 C), temperature source Oral, resp. rate 16, last menstrual period 05/15/2015, unknown if currently breastfeeding.    Physical Exam  Gen: NAD Lungs: CTAB CV: RRR w/o M/R/G Back: Neg CVAT bilaterally Abdomen: soft, NT, no rebound or guarding Pelvic: 1/thick/high Ext: WNL FHRT: Cat 1 No ctxs per toco or palpation ED Course  Assessment: Benign exam -- normal pregnancy discomforts  Plan: Morphine Sulfate 10 mg IM x 1 now Zofran 8 mg ODT UA   Lauren Pineda CNM, MS 01/29/2016 11:48 AM   ADDENDUM:  Results for orders placed or performed during the hospital encounter of 01/29/16 (from the past 48 hour(s))  Urinalysis, Routine w reflex microscopic (not at [facility])     Status: Abnormal   Collection Time: 01/29/16  1:32 AM  Result Value Ref Range   Color, Urine YELLOW YELLOW   APPearance CLEAR CLEAR   Specific Gravity, Urine >1.030 (H) 1.005 - 1.030   pH 6.0 5.0 - 8.0   Glucose, UA NEGATIVE NEGATIVE mg/dL   Hgb urine dipstick MODERATE (A) NEGATIVE   Bilirubin Urine NEGATIVE NEGATIVE   Ketones, ur 15 (A) NEGATIVE mg/dL   Protein, ur NEGATIVE NEGATIVE mg/dL   Nitrite NEGATIVE NEGATIVE   Leukocytes, UA MODERATE (A) NEGATIVE  Urine microscopic-add on     Status: Abnormal   Collection Time: 01/29/16  1:32 AM  Result Value Ref Range   Squamous Epithelial / LPF 0-5 (A) NONE SEEN   WBC, UA 6-30 0 - 5 WBC/hpf   RBC / HPF 6-30 0 - 5 RBC/hpf   Bacteria, UA FEW (A) NONE SEEN  UA = uti versus contaminated specimen versus kidney stone. Unable to decipher - sample sent for culture. Per pt report, the only time she drinks water is to add it to her soft drink to make it less sweet. Had lengthy discussion about the need for water intake w/ the addition of lemon only. Will contact pt if culture results return positive. She declines short course of Macrobid.  Reports feeling much better s/p IM Morphine and Zofran. States has office appt tomorrow,  which she was encouraged to keep. Strict labor precautions given. Advised to call with questions or concerns. Cat 1 FHRT, no ctxs. D/C'd home pain free.   Sherre Scarlet, CNM 01/28/16, 2:30 AM

## 2016-01-29 NOTE — MAU Note (Signed)
Pt presents complaining of back pain, leg pain, headache and stuffy nose. States it is 10/10 pain constantly. Denies contractions. Denies leaking or bleeding. Reports good fetal movement. Has not tried any medication for the pain.

## 2016-01-30 LAB — CULTURE, OB URINE

## 2016-02-03 ENCOUNTER — Encounter (HOSPITAL_COMMUNITY): Payer: Self-pay

## 2016-02-14 ENCOUNTER — Encounter (HOSPITAL_COMMUNITY)
Admission: RE | Admit: 2016-02-14 | Discharge: 2016-02-14 | Disposition: A | Discharge status: Home / Self Care | Payer: Medicaid Other | Source: Ambulatory Visit | Attending: Obstetrics and Gynecology | Admitting: Obstetrics and Gynecology

## 2016-02-14 ENCOUNTER — Encounter (HOSPITAL_COMMUNITY): Payer: Self-pay | Admitting: Anesthesiology

## 2016-02-14 LAB — CBC
HCT: 28.1 % — ABNORMAL LOW (ref 36.0–46.0)
Hemoglobin: 9.2 g/dL — ABNORMAL LOW (ref 12.0–15.0)
MCH: 29 pg (ref 26.0–34.0)
MCHC: 32.7 g/dL (ref 30.0–36.0)
MCV: 88.6 fL (ref 78.0–100.0)
PLATELETS: 227 10*3/uL (ref 150–400)
RBC: 3.17 MIL/uL — ABNORMAL LOW (ref 3.87–5.11)
RDW: 15.1 % (ref 11.5–15.5)
WBC: 12 10*3/uL — AB (ref 4.0–10.5)

## 2016-02-14 LAB — TYPE AND SCREEN
ABO/RH(D): O POS
ANTIBODY SCREEN: NEGATIVE

## 2016-02-14 NOTE — H&P (Signed)
  Lauren Pineda is a 30 y.o. female presenting for scheduled repeat cesarean section at 39+3 weeks.  Pregnancy followed at [facility] since 09/21/2015 at 18+3  weeks and remarkable for:  1. 3 previous cesarean deliveries 2. h/o IUGR in 2010 3. h/o 3 previous miscarriages 4. + GBS 5. 2 previous preterm delivery  OB History    Gravida Para Term Preterm AB TAB SAB Ectopic Multiple Living   8 4 2 2 3  3   4      Past Medical History  Diagnosis Date  . Medical history non-contributory    Past Surgical History  Procedure Laterality Date  . Cesarean section    . Cesarean section N/A 11/23/2012    Procedure: CESAREAN SECTION;  Surgeon: Kathreen CosierBernard A Marshall, MD;  Location: [facility];  Service: Obstetrics;  Laterality: N/A;  Repeat Cesarean Section Delivery Baby    @      , Apgars   . Dilation and evacuation N/A 04/30/2014    Procedure: DILATATION AND EVACUATION;  Surgeon: Kathreen CosierBernard A Marshall, MD;  Location: [facility];  Service: Gynecology;  Laterality: N/A;    Family History:   family history includes Asthma in her brother and father. Social History:    reports that she has quit smoking. She has never used smokeless tobacco. She reports that she does not drink alcohol or use illicit drugs.   Prenatal labs: ABO, Rh: --/--/O POS (05/30 1115) Antibody: NEG (05/30 1115) Rubella: immune RPR:   non-reactive HBsAg:   negative HIV: Non Reactive (10/11 1020)  GBS:   positive   Prenatal Transfer Tool  Maternal Diabetes: No Genetic Screening: Normal ( normal AFP) Maternal Ultrasounds/Referrals: Normal Fetal Ultrasounds or other Referrals:  None Maternal Substance Abuse:  No Significant Maternal Medications:  None Significant Maternal Lab Results: None     Last menstrual period 05/15/2015, unknown if currently breastfeeding.  General Appearance: Alert, appropriate appearance for age. No acute distress. BP 120/76 HEENT Exam: Grossly normal Chest/Respiratory Exam: Normal chest wall and  respirations. Clear to auscultation  Cardiovascular Exam: Regular rate and rhythm. S1, S2, no murmur Gastrointestinal Exam: soft, non-tender, Uterus gravid with size compatible with GA, Vertex presentation by Leopold's maneuvers Psychiatric Exam: Alert and oriented, appropriate affect  ++++++++++++++++++++++++++++++++++++++++++++++++++++++++++++++++  Vaginal exam: deferred  Fetal tracings: N/A  ++++++++++++++++++++++++++++++++++++++++++++++++++++++++++++++++   Assessment/Plan:  SIUP at 39+3 days with 3 previous cesarean deliveries, scheduled for repeat cesarean 02/15/16.   Cesarean section reviewed with pt with R&B including but not limited to:  bleeding, infection, injury to other organs. Low transverse approach planned which will allow vaginal delivery with future pregnancies. Should a vertical incision or inverted T be needed, patient is aware that repeat cesarean sections would be recommended in the future. Expected hospital stay and recovery also discussed.    Lauren LaySandra Nasirah Sachs MD 02/14/2016, 6:12 PM

## 2016-02-14 NOTE — Patient Instructions (Signed)
[address]  02/14/2016   Your procedure is scheduled on:  02/15/2016  Enter through the [facility] Entrance of [facility] at 0630 AM.  Pick up the phone at the desk and dial [phone].   Call this number if you have problems the morning of surgery: [phone]   Remember:   Do not eat food:After Midnight.  Do not drink clear liquids: After Midnight.  Take these medicines the morning of surgery with A SIP OF WATER: none   Do not wear jewelry, make-up or nail polish.  Do not wear lotions, powders, or perfumes. You may wear deodorant.  Do not shave 48 hours prior to surgery.  Do not bring valuables to the hospital.  [facility] is not   responsible for any belongings or valuables brought to the hospital.  Contacts, dentures or bridgework may not be worn into surgery.  Leave suitcase in the car. After surgery it may be brought to your room.  For patients admitted to the hospital, checkout time is 11:00 AM the day of              discharge.   Patients discharged the day of surgery will not be allowed to drive             home.  Name and phone number of your driver: na  Special Instructions:   Shower using CHG 2 nights before surgery and the night before surgery.  If you shower the day of surgery use CHG.  Use special wash - you have one bottle of CHG for all showers.  You should use approximately 1/3 of the bottle for each shower.   Please read over the following fact sheets that you were given:   Surgical Site Infection Prevention

## 2016-02-14 NOTE — Anesthesia Preprocedure Evaluation (Addendum)
Anesthesia Evaluation  Patient identified by MRN, date of birth, ID band Patient awake    Reviewed: Allergy & Precautions, NPO status , Patient's Chart, lab work & pertinent test results  Airway Mallampati: II  TM Distance: >3 FB Neck ROM: Full    Dental no notable dental hx. (+) Teeth Intact   Pulmonary neg pulmonary ROS, former smoker,    Pulmonary exam normal breath sounds clear to auscultation       Cardiovascular negative cardio ROS   Rhythm:Regular Rate:Normal     Neuro/Psych Left sided sciatica  Neuromuscular disease negative neurological ROS  negative psych ROS   GI/Hepatic Neg liver ROS, GERD  Medicated and Controlled,  Endo/Other  Obesity   Renal/GU negative Renal ROS  negative genitourinary   Musculoskeletal   Abdominal (+) + obese,   Peds  Hematology  (+) anemia ,   Anesthesia Other Findings   Reproductive/Obstetrics Previous C/Section x3                            Anesthesia Physical Anesthesia Plan  ASA: II  Anesthesia Plan: Spinal and Combined Spinal and Epidural   Post-op Pain Management:    Induction:   Airway Management Planned: Natural Airway  Additional Equipment:   Intra-op Plan:   Post-operative Plan: Extubation in OR  Informed Consent: I have reviewed the patients History and Physical, chart, labs and discussed the procedure including the risks, benefits and alternatives for the proposed anesthesia with the patient or authorized representative who has indicated his/her understanding and acceptance.   Dental advisory given  Plan Discussed with: CRNA, Anesthesiologist and Surgeon  Anesthesia Plan Comments:         Anesthesia Quick Evaluation

## 2016-02-15 ENCOUNTER — Inpatient Hospital Stay (HOSPITAL_COMMUNITY): Payer: Medicaid Other | Admitting: Anesthesiology

## 2016-02-15 ENCOUNTER — Inpatient Hospital Stay (HOSPITAL_COMMUNITY)
Admission: RE | Admit: 2016-02-15 | Discharge: 2016-02-18 | DRG: 766 | Disposition: A | Discharge status: Home / Self Care | Payer: Medicaid Other | Source: Ambulatory Visit | Attending: Obstetrics and Gynecology | Admitting: Obstetrics and Gynecology

## 2016-02-15 ENCOUNTER — Encounter (HOSPITAL_COMMUNITY)
Admission: RE | Disposition: A | Discharge status: Home / Self Care | Payer: Self-pay | Source: Ambulatory Visit | Attending: Obstetrics and Gynecology

## 2016-02-15 ENCOUNTER — Encounter (HOSPITAL_COMMUNITY): Payer: Self-pay | Admitting: *Deleted

## 2016-02-15 DIAGNOSIS — Z825 Family history of asthma and other chronic lower respiratory diseases: Secondary | ICD-10-CM

## 2016-02-15 DIAGNOSIS — M5432 Sciatica, left side: Secondary | ICD-10-CM | POA: Diagnosis present

## 2016-02-15 DIAGNOSIS — O9962 Diseases of the digestive system complicating childbirth: Secondary | ICD-10-CM | POA: Diagnosis present

## 2016-02-15 DIAGNOSIS — Z3A39 39 weeks gestation of pregnancy: Secondary | ICD-10-CM | POA: Diagnosis not present

## 2016-02-15 DIAGNOSIS — O9902 Anemia complicating childbirth: Secondary | ICD-10-CM | POA: Diagnosis present

## 2016-02-15 DIAGNOSIS — D649 Anemia, unspecified: Secondary | ICD-10-CM | POA: Diagnosis present

## 2016-02-15 DIAGNOSIS — Z87891 Personal history of nicotine dependence: Secondary | ICD-10-CM

## 2016-02-15 DIAGNOSIS — Z6831 Body mass index (BMI) 31.0-31.9, adult: Secondary | ICD-10-CM

## 2016-02-15 DIAGNOSIS — K219 Gastro-esophageal reflux disease without esophagitis: Secondary | ICD-10-CM | POA: Diagnosis present

## 2016-02-15 DIAGNOSIS — E669 Obesity, unspecified: Secondary | ICD-10-CM | POA: Diagnosis present

## 2016-02-15 DIAGNOSIS — O99824 Streptococcus B carrier state complicating childbirth: Secondary | ICD-10-CM | POA: Diagnosis present

## 2016-02-15 DIAGNOSIS — O34211 Maternal care for low transverse scar from previous cesarean delivery: Principal | ICD-10-CM | POA: Diagnosis present

## 2016-02-15 DIAGNOSIS — O99214 Obesity complicating childbirth: Secondary | ICD-10-CM | POA: Diagnosis present

## 2016-02-15 LAB — RPR: RPR: NONREACTIVE

## 2016-02-15 SURGERY — Surgical Case
Anesthesia: Spinal

## 2016-02-15 MED ORDER — SIMETHICONE 80 MG PO CHEW
80.0000 mg | CHEWABLE_TABLET | Freq: Three times a day (TID) | ORAL | Status: DC
Start: 2016-02-15 — End: 2016-02-18
  Administered 2016-02-15 – 2016-02-18 (×9): 80 mg via ORAL
  Filled 2016-02-15 (×10): qty 1

## 2016-02-15 MED ORDER — SODIUM CHLORIDE 0.9 % IR SOLN
Status: DC | PRN
  Administered 2016-02-15: 1 via INTRAVESICAL

## 2016-02-15 MED ORDER — MORPHINE SULFATE (PF) 0.5 MG/ML IJ SOLN
INTRAMUSCULAR | Status: AC
  Filled 2016-02-15: qty 10

## 2016-02-15 MED ORDER — BUPIVACAINE IN DEXTROSE 0.75-8.25 % IT SOLN
INTRATHECAL | Status: AC
  Filled 2016-02-15: qty 2

## 2016-02-15 MED ORDER — COCONUT OIL OIL: 1.0000 "application " | TOPICAL_OIL | Status: DC | PRN

## 2016-02-15 MED ORDER — NALBUPHINE HCL 10 MG/ML IJ SOLN: 5.0000 mg | INTRAMUSCULAR | Status: DC | PRN

## 2016-02-15 MED ORDER — ZOLPIDEM TARTRATE 5 MG PO TABS
5.0000 mg | ORAL_TABLET | Freq: Every evening | ORAL | Status: DC | PRN
Start: 2016-02-15 — End: 2016-02-18

## 2016-02-15 MED ORDER — CEFAZOLIN SODIUM-DEXTROSE 2-4 GM/100ML-% IV SOLN
2.0000 g | INTRAVENOUS | Status: AC
  Administered 2016-02-15: 2 g via INTRAVENOUS

## 2016-02-15 MED ORDER — LIDOCAINE-EPINEPHRINE (PF) 2 %-1:200000 IJ SOLN
INTRAMUSCULAR | Status: AC
  Filled 2016-02-15: qty 20

## 2016-02-15 MED ORDER — SIMETHICONE 80 MG PO CHEW
80.0000 mg | CHEWABLE_TABLET | ORAL | Status: DC
  Administered 2016-02-16 – 2016-02-17 (×3): 80 mg via ORAL
  Filled 2016-02-15 (×3): qty 1

## 2016-02-15 MED ORDER — BUPIVACAINE IN DEXTROSE 0.75-8.25 % IT SOLN
INTRATHECAL | Status: DC | PRN
  Administered 2016-02-15: 1.5 mg via INTRATHECAL

## 2016-02-15 MED ORDER — FERROUS SULFATE 325 (65 FE) MG PO TABS
325.0000 mg | ORAL_TABLET | Freq: Every day | ORAL | Status: DC
  Filled 2016-02-15: qty 1

## 2016-02-15 MED ORDER — KETOROLAC TROMETHAMINE 30 MG/ML IJ SOLN
INTRAMUSCULAR | Status: AC
  Filled 2016-02-15: qty 1

## 2016-02-15 MED ORDER — OXYCODONE HCL 5 MG PO TABS
10.0000 mg | ORAL_TABLET | Freq: Once | ORAL | Status: AC
  Administered 2016-02-15: 10 mg via ORAL
  Filled 2016-02-15: qty 2

## 2016-02-15 MED ORDER — METHYLERGONOVINE MALEATE 0.2 MG PO TABS: 0.2000 mg | ORAL_TABLET | ORAL | Status: DC | PRN

## 2016-02-15 MED ORDER — OXYCODONE-ACETAMINOPHEN 5-325 MG PO TABS
2.0000 | ORAL_TABLET | ORAL | Status: DC | PRN
  Administered 2016-02-16 – 2016-02-18 (×13): 2 via ORAL
  Filled 2016-02-15 (×14): qty 2

## 2016-02-15 MED ORDER — MEASLES, MUMPS & RUBELLA VAC ~~LOC~~ INJ
0.5000 mL | INJECTION | Freq: Once | SUBCUTANEOUS | Status: DC
Start: 2016-02-16 — End: 2016-02-15
  Filled 2016-02-15: qty 0.5

## 2016-02-15 MED ORDER — KETOROLAC TROMETHAMINE 30 MG/ML IJ SOLN
30.0000 mg | Freq: Four times a day (QID) | INTRAMUSCULAR | Status: AC | PRN
  Administered 2016-02-15: 30 mg via INTRAMUSCULAR

## 2016-02-15 MED ORDER — LACTATED RINGERS IV SOLN
Freq: Once | INTRAVENOUS | Status: AC
Start: 2016-02-15 — End: 2016-02-15
  Administered 2016-02-15: 07:00:00 via INTRAVENOUS

## 2016-02-15 MED ORDER — FENTANYL CITRATE (PF) 100 MCG/2ML IJ SOLN
INTRAMUSCULAR | Status: DC | PRN
  Administered 2016-02-15: 20 ug via INTRATHECAL

## 2016-02-15 MED ORDER — FENTANYL CITRATE (PF) 100 MCG/2ML IJ SOLN
INTRAMUSCULAR | Status: AC
  Filled 2016-02-15: qty 2

## 2016-02-15 MED ORDER — BUPIVACAINE HCL (PF) 0.25 % IJ SOLN
INTRAMUSCULAR | Status: AC
  Filled 2016-02-15: qty 10

## 2016-02-15 MED ORDER — SENNOSIDES-DOCUSATE SODIUM 8.6-50 MG PO TABS
2.0000 | ORAL_TABLET | ORAL | Status: DC
  Administered 2016-02-16 – 2016-02-17 (×3): 2 via ORAL
  Filled 2016-02-15 (×3): qty 2

## 2016-02-15 MED ORDER — BUPIVACAINE HCL (PF) 0.25 % IJ SOLN
INTRAMUSCULAR | Status: DC | PRN
  Administered 2016-02-15: 20 mL

## 2016-02-15 MED ORDER — OXYTOCIN 10 UNIT/ML IJ SOLN
40.0000 [IU] | INTRAVENOUS | Status: DC | PRN
  Administered 2016-02-15: 40 [IU] via INTRAVENOUS

## 2016-02-15 MED ORDER — SIMETHICONE 80 MG PO CHEW: 80.0000 mg | CHEWABLE_TABLET | ORAL | Status: DC | PRN

## 2016-02-15 MED ORDER — OXYTOCIN 10 UNIT/ML IJ SOLN
INTRAMUSCULAR | Status: AC
  Filled 2016-02-15: qty 4

## 2016-02-15 MED ORDER — NALOXONE HCL 2 MG/2ML IJ SOSY
1.0000 ug/kg/h | PREFILLED_SYRINGE | INTRAMUSCULAR | Status: DC | PRN
  Filled 2016-02-15: qty 2

## 2016-02-15 MED ORDER — ONDANSETRON HCL 4 MG/2ML IJ SOLN: 4.0000 mg | Freq: Three times a day (TID) | INTRAMUSCULAR | Status: DC | PRN

## 2016-02-15 MED ORDER — NALBUPHINE HCL 10 MG/ML IJ SOLN: 5.0000 mg | Freq: Once | INTRAMUSCULAR | Status: DC | PRN

## 2016-02-15 MED ORDER — WITCH HAZEL-GLYCERIN EX PADS: 1.0000 "application " | MEDICATED_PAD | CUTANEOUS | Status: DC | PRN

## 2016-02-15 MED ORDER — BUPIVACAINE HCL (PF) 0.25 % IJ SOLN
INTRAMUSCULAR | Status: AC
  Filled 2016-02-15: qty 20

## 2016-02-15 MED ORDER — NALOXONE HCL 0.4 MG/ML IJ SOLN: 0.4000 mg | INTRAMUSCULAR | Status: DC | PRN

## 2016-02-15 MED ORDER — KETOROLAC TROMETHAMINE 30 MG/ML IJ SOLN: 30.0000 mg | Freq: Four times a day (QID) | INTRAMUSCULAR | Status: AC | PRN

## 2016-02-15 MED ORDER — ONDANSETRON HCL 4 MG/2ML IJ SOLN
INTRAMUSCULAR | Status: AC
  Filled 2016-02-15: qty 2

## 2016-02-15 MED ORDER — NALBUPHINE HCL 10 MG/ML IJ SOLN
5.0000 mg | Freq: Once | INTRAMUSCULAR | Status: DC | PRN
  Filled 2016-02-15: qty 1

## 2016-02-15 MED ORDER — METHYLERGONOVINE MALEATE 0.2 MG/ML IJ SOLN: 0.2000 mg | INTRAMUSCULAR | Status: DC | PRN

## 2016-02-15 MED ORDER — IBUPROFEN 600 MG PO TABS
600.0000 mg | ORAL_TABLET | Freq: Four times a day (QID) | ORAL | Status: DC
  Administered 2016-02-15 – 2016-02-18 (×12): 600 mg via ORAL
  Filled 2016-02-15 (×12): qty 1

## 2016-02-15 MED ORDER — NALBUPHINE HCL 10 MG/ML IJ SOLN
5.0000 mg | INTRAMUSCULAR | Status: DC | PRN
  Administered 2016-02-15: 5 mg via INTRAVENOUS

## 2016-02-15 MED ORDER — DIBUCAINE 1 % RE OINT: 1.0000 "application " | TOPICAL_OINTMENT | RECTAL | Status: DC | PRN

## 2016-02-15 MED ORDER — FERROUS SULFATE 325 (65 FE) MG PO TABS
325.0000 mg | ORAL_TABLET | Freq: Two times a day (BID) | ORAL | Status: DC
  Administered 2016-02-15 – 2016-02-18 (×6): 325 mg via ORAL
  Filled 2016-02-15 (×6): qty 1

## 2016-02-15 MED ORDER — TETANUS-DIPHTH-ACELL PERTUSSIS 5-2.5-18.5 LF-MCG/0.5 IM SUSP: 0.5000 mL | Freq: Once | INTRAMUSCULAR | Status: DC

## 2016-02-15 MED ORDER — LACTATED RINGERS IV SOLN
INTRAVENOUS | Status: DC
Start: 2016-02-15 — End: 2016-02-18
  Administered 2016-02-15 (×2): via INTRAVENOUS

## 2016-02-15 MED ORDER — SCOPOLAMINE 1 MG/3DAYS TD PT72
MEDICATED_PATCH | TRANSDERMAL | Status: AC
  Administered 2016-02-15: 1.5 mg via TRANSDERMAL
  Filled 2016-02-15: qty 1

## 2016-02-15 MED ORDER — PHENYLEPHRINE 8 MG IN D5W 100 ML (0.08MG/ML) PREMIX OPTIME
INJECTION | INTRAVENOUS | Status: DC | PRN
  Administered 2016-02-15: 60 ug/min via INTRAVENOUS

## 2016-02-15 MED ORDER — ONDANSETRON HCL 4 MG/2ML IJ SOLN
INTRAMUSCULAR | Status: DC | PRN
Start: 2016-02-15 — End: 2016-02-15
  Administered 2016-02-15: 4 mg via INTRAVENOUS

## 2016-02-15 MED ORDER — DIPHENHYDRAMINE HCL 50 MG/ML IJ SOLN
12.5000 mg | INTRAMUSCULAR | Status: DC | PRN
  Administered 2016-02-15: 12.5 mg via INTRAVENOUS
  Filled 2016-02-15: qty 1

## 2016-02-15 MED ORDER — OXYCODONE-ACETAMINOPHEN 5-325 MG PO TABS: 1.0000 | ORAL_TABLET | ORAL | Status: DC | PRN

## 2016-02-15 MED ORDER — PRENATAL MULTIVITAMIN CH
1.0000 | ORAL_TABLET | Freq: Every day | ORAL | Status: DC
  Administered 2016-02-15 – 2016-02-18 (×4): 1 via ORAL
  Filled 2016-02-15 (×4): qty 1

## 2016-02-15 MED ORDER — SCOPOLAMINE 1 MG/3DAYS TD PT72
1.0000 | MEDICATED_PATCH | TRANSDERMAL | Status: DC
Start: 2016-02-15 — End: 2016-02-15
  Administered 2016-02-15: 1.5 mg via TRANSDERMAL

## 2016-02-15 MED ORDER — MENTHOL 3 MG MT LOZG: 1.0000 | LOZENGE | OROMUCOSAL | Status: DC | PRN

## 2016-02-15 MED ORDER — IBUPROFEN 600 MG PO TABS: 600.0000 mg | ORAL_TABLET | Freq: Four times a day (QID) | ORAL | Status: DC | PRN

## 2016-02-15 MED ORDER — DIPHENHYDRAMINE HCL 25 MG PO CAPS
25.0000 mg | ORAL_CAPSULE | Freq: Four times a day (QID) | ORAL | Status: DC | PRN
  Filled 2016-02-15: qty 1

## 2016-02-15 MED ORDER — LACTATED RINGERS IV SOLN
INTRAVENOUS | Status: DC
  Administered 2016-02-15 (×4): via INTRAVENOUS

## 2016-02-15 MED ORDER — PHENYLEPHRINE 8 MG IN D5W 100 ML (0.08MG/ML) PREMIX OPTIME
INJECTION | INTRAVENOUS | Status: AC
  Filled 2016-02-15: qty 100

## 2016-02-15 MED ORDER — OXYTOCIN 40 UNITS IN LACTATED RINGERS INFUSION - SIMPLE MED: 2.5000 [IU]/h | INTRAVENOUS | Status: AC

## 2016-02-15 MED ORDER — ACETAMINOPHEN 325 MG PO TABS
650.0000 mg | ORAL_TABLET | ORAL | Status: DC | PRN
  Administered 2016-02-15 (×2): 650 mg via ORAL
  Filled 2016-02-15 (×3): qty 2

## 2016-02-15 MED ORDER — DIPHENHYDRAMINE HCL 25 MG PO CAPS
25.0000 mg | ORAL_CAPSULE | ORAL | Status: DC | PRN
  Administered 2016-02-15: 25 mg via ORAL
  Filled 2016-02-15: qty 1

## 2016-02-15 MED ORDER — MORPHINE SULFATE (PF) 0.5 MG/ML IJ SOLN
INTRAMUSCULAR | Status: DC | PRN
Start: 2016-02-15 — End: 2016-02-15
  Administered 2016-02-15: .2 mg via INTRATHECAL

## 2016-02-15 MED ORDER — MEPERIDINE HCL 25 MG/ML IJ SOLN: 6.2500 mg | INTRAMUSCULAR | Status: DC | PRN

## 2016-02-15 MED ORDER — SODIUM BICARBONATE 8.4 % IV SOLN
INTRAVENOUS | Status: AC
  Filled 2016-02-15: qty 50

## 2016-02-15 MED ORDER — FENTANYL CITRATE (PF) 100 MCG/2ML IJ SOLN: 25.0000 ug | INTRAMUSCULAR | Status: DC | PRN

## 2016-02-15 MED ORDER — SODIUM CHLORIDE 0.9% FLUSH: 3.0000 mL | INTRAVENOUS | Status: DC | PRN

## 2016-02-15 SURGICAL SUPPLY — 40 items
APL SKNCLS STERI-STRIP NONHPOA (GAUZE/BANDAGES/DRESSINGS) ×1
BENZOIN TINCTURE PRP APPL 2/3 (GAUZE/BANDAGES/DRESSINGS) ×3 IMPLANT
BOOTIES KNEE HIGH SLOAN (MISCELLANEOUS) ×6 IMPLANT
CLAMP CORD UMBIL (MISCELLANEOUS) IMPLANT
CLOSURE STERI STRIP 1/2 X4 (GAUZE/BANDAGES/DRESSINGS) ×1 IMPLANT
CLOSURE WOUND 1/2 X4 (GAUZE/BANDAGES/DRESSINGS) ×1
CLOTH BEACON ORANGE TIMEOUT ST (SAFETY) ×3 IMPLANT
DECANTER SPIKE VIAL GLASS SM (MISCELLANEOUS) ×2 IMPLANT
DRAIN JACKSON PRT FLT 10 (DRAIN) IMPLANT
DRSG OPSITE POSTOP 4X10 (GAUZE/BANDAGES/DRESSINGS) ×3 IMPLANT
DURAPREP 26ML APPLICATOR (WOUND CARE) ×3 IMPLANT
ELECT REM PT RETURN 9FT ADLT (ELECTROSURGICAL) ×3
ELECTRODE REM PT RTRN 9FT ADLT (ELECTROSURGICAL) ×1 IMPLANT
EVACUATOR SILICONE 100CC (DRAIN) IMPLANT
EXTRACTOR VACUUM M CUP 4 TUBE (SUCTIONS) IMPLANT
EXTRACTOR VACUUM M CUP 4' TUBE (SUCTIONS)
GLOVE BIOGEL PI IND STRL 7.0 (GLOVE) ×2 IMPLANT
GLOVE BIOGEL PI INDICATOR 7.0 (GLOVE) ×4
GLOVE ECLIPSE 6.5 STRL STRAW (GLOVE) ×3 IMPLANT
GOWN STRL REUS W/TWL LRG LVL3 (GOWN DISPOSABLE) ×6 IMPLANT
KIT ABG SYR 3ML LUER SLIP (SYRINGE) IMPLANT
NDL HYPO 25X5/8 SAFETYGLIDE (NEEDLE) IMPLANT
NEEDLE HYPO 22GX1.5 SAFETY (NEEDLE) ×3 IMPLANT
NEEDLE HYPO 25X5/8 SAFETYGLIDE (NEEDLE) IMPLANT
NS IRRIG 1000ML POUR BTL (IV SOLUTION) ×6 IMPLANT
PACK C SECTION WH (CUSTOM PROCEDURE TRAY) ×3 IMPLANT
PAD OB MATERNITY 4.3X12.25 (PERSONAL CARE ITEMS) ×3 IMPLANT
PENCIL SMOKE EVAC W/HOLSTER (ELECTROSURGICAL) ×3 IMPLANT
RTRCTR C-SECT PINK 25CM LRG (MISCELLANEOUS) ×3 IMPLANT
STRIP CLOSURE SKIN 1/2X4 (GAUZE/BANDAGES/DRESSINGS) ×2 IMPLANT
SUT MNCRL AB 3-0 PS2 27 (SUTURE) ×3 IMPLANT
SUT SILK 2 0 FSL 18 (SUTURE) IMPLANT
SUT VIC AB 0 CTX 36 (SUTURE) ×6
SUT VIC AB 0 CTX36XBRD ANBCTRL (SUTURE) ×2 IMPLANT
SUT VIC AB 1 CT1 36 (SUTURE) ×6 IMPLANT
SUT VIC AB 2-0 CT1 27 (SUTURE)
SUT VIC AB 2-0 CT1 TAPERPNT 27 (SUTURE) IMPLANT
SYR 20CC LL (SYRINGE) ×3 IMPLANT
TOWEL OR 17X24 6PK STRL BLUE (TOWEL DISPOSABLE) ×3 IMPLANT
TRAY FOLEY CATH SILVER 14FR (SET/KITS/TRAYS/PACK) ×3 IMPLANT

## 2016-02-15 NOTE — Anesthesia Postprocedure Evaluation (Signed)
Anesthesia Post Note  Patient: Paralee CancelFerrice J Langdon  Procedure(s) Performed: Procedure(s) (LRB): CESAREAN SECTION (N/A)  Patient location during evaluation: Mother Baby Anesthesia Type: Combined Spinal/Epidural Level of consciousness: oriented and awake and alert Pain management: pain level controlled Vital Signs Assessment: post-procedure vital signs reviewed and stable Respiratory status: spontaneous breathing and nonlabored ventilation Cardiovascular status: stable Postop Assessment: patient able to bend at knees, no signs of nausea or vomiting and adequate PO intake Anesthetic complications: no     Last Vitals:  Filed Vitals:   02/15/16 1250 02/15/16 1519  BP: 108/64 103/55  Pulse: 70 57  Temp: 36.7 C 36.5 C  Resp: 20 16    Last Pain:  Filed Vitals:   02/15/16 1525  PainSc: 8    Pain Goal: Patients Stated Pain Goal: 0 (02/15/16 1030)               Luciana Cammarata Hristova

## 2016-02-15 NOTE — Transfer of Care (Signed)
Immediate Anesthesia Transfer of Care Note  Patient: Lauren Pineda  Procedure(s) Performed: Procedure(s): CESAREAN SECTION (N/A)  Patient Location: PACU  Anesthesia Type:Spinal and Epidural  Level of Consciousness: awake  Airway & Oxygen Therapy: Patient Spontanous Breathing  Post-op Assessment: Report given to RN  Post vital signs: Reviewed and stable  Last Vitals:  Filed Vitals:   02/15/16 0644  BP: 117/78  Pulse: 93  Temp: 36.8 C  Resp: 16    Last Pain: There were no vitals filed for this visit.    Patients Stated Pain Goal: 3 (02/15/16 16100644)  Complications: No apparent anesthesia complications

## 2016-02-15 NOTE — Anesthesia Postprocedure Evaluation (Signed)
Anesthesia Post Note  Patient: Lauren Pineda  Procedure(s) Performed: Procedure(s) (LRB): CESAREAN SECTION (N/A)  Patient location during evaluation: PACU Anesthesia Type: Spinal Level of consciousness: awake and alert Pain management: pain level controlled Vital Signs Assessment: post-procedure vital signs reviewed and stable Respiratory status: spontaneous breathing, nonlabored ventilation and respiratory function stable Cardiovascular status: blood pressure returned to baseline and stable Postop Assessment: no signs of nausea or vomiting, spinal receding, no backache, patient able to bend at knees and no headache Anesthetic complications: no     Last Vitals:  Filed Vitals:   02/15/16 0931 02/15/16 0945  BP:  111/79  Pulse: 66 58  Temp:    Resp: 24 16    Last Pain:  Filed Vitals:   02/15/16 0952  PainSc: 7    Pain Goal: Patients Stated Pain Goal: 3 (02/15/16 0644)               Didi Ganaway A.

## 2016-02-15 NOTE — Interval H&P Note (Signed)
History and Physical Interval Note:  02/15/2016 7:13 AM  Lauren Pineda  has presented today for surgery, with the diagnosis of Prior Cesarean Section  The various methods of treatment have been discussed with the patient and family. After consideration of risks, benefits and other options for treatment, the patient has consented to  Procedure(s): CESAREAN SECTION (N/A) as a surgical intervention .  The patient's history has been reviewed, patient examined, no change in status, stable for surgery.  I have reviewed the patient's chart and labs.  Questions were answered to the patient's satisfaction.     Reyhan Moronta A

## 2016-02-15 NOTE — Anesthesia Procedure Notes (Signed)
Spinal Patient location during procedure: OR Start time: 02/15/2016 7:49 AM Staffing Anesthesiologist: Mal AmabileFOSTER, Rachal Dvorsky Performed by: anesthesiologist  Preanesthetic Checklist Completed: patient identified, site marked, surgical consent, pre-op evaluation, timeout performed, IV checked, risks and benefits discussed and monitors and equipment checked Spinal Block Patient position: sitting Prep: site prepped and draped and DuraPrep Patient monitoring: cardiac monitor, continuous pulse ox, blood pressure and heart rate Approach: midline Location: L3-4 Injection technique: catheter Needle Needle type: Tuohy and Spinocan  Needle gauge: 24 G Needle length: 12.7 cm Needle insertion depth: 5 cm Catheter type: closed end flexible Catheter size: 19 g Assessment Sensory level: T4 Additional Notes Epidural performed @ L3-4 using 17ga Touhy needle using LOR with air. SAB performed through the epidural needle. CSF clear, free flow, transient paresthesia right leg. LA+ narcotics injected and spinal needle withdrawn. Epidural catheter threaded 5 cm into the epidural space. Epidural needle withdrawn and sterile dressing applied. Patient placed supine with LUD. She tolerated the procedure well. Adequate sensory level.

## 2016-02-15 NOTE — Addendum Note (Signed)
Addendum  created 02/15/16 1528 by Elgie CongoNataliya H Dahl Higinbotham, CRNA   Modules edited: Clinical Notes   Clinical Notes:  File: [id number]

## 2016-02-15 NOTE — Op Note (Signed)
Preoperative diagnosis: Intrauterine pregnancy at 39 weeks and 4 days with 3 previous cesarean deliveries  Post operative diagnosis: Same  Anesthesia: Spinal and epidural  Anesthesiologist: Dr. Malen GauzeFoster  Procedure: Repeat low transverse cesarean section  Surgeon: Dr. Dois DavenportSandra Abdirahim Flavell  Assistant: Henreitta LeberElmira Powell PA-C  Estimated blood loss: 800 cc  Procedure:  After being informed of the planned procedure and possible complications including bleeding, infection, injury to other organs, informed consent is obtained. The patient is taken to OR #1 and given spinal/epidural combination anesthesia without complication. She is placed in the dorsal decubitus position with the pelvis tilted to the left. She is then prepped and draped in a sterile fashion. A Foley catheter is inserted in her bladder.  After assessing adequate level of anesthesia, we infiltrate the suprapubic area with 20 cc of Marcaine 0.25 and perform a Pfannenstiel incision which is brought down sharply to the fascia. The fascia is entered in a low transverse fashion. Linea alba is carefully dissected. Peritoneum is entered in a midline fashion. An Alexis retractor is easily positioned. A bladder flap is created. There is minimal adhesions.  The myometrium is then entered in a low transverse fashion, 2 cm above the vesico-uterine junction ; first with knife and then extended bluntly. Amniotic fluid is clear. We assist the birth of a female  infant in vertex presentation. Mouth and nose are suctioned. The baby is delivered. The cord is clamped and sectioned. The baby is given to the neonatologist present in the room.  10 cc of blood is drawn from the umbilical vein.The placenta is allowed to deliver spontaneously. It is complete and the cord has 3 vessels. Uterine revision is negative.  We proceed with closure of the myometrium in 2 layers: First with a running locked suture of 0 Vicryl, then with a Lembert suture of 0 Vicryl imbricating  the first one. Hemostasis is completed with cauterization on peritoneal edges.  Both paracolic gutters are cleaned. Both tubes and ovaries are assessed and normal. The pelvis is profusely irrigated with warm saline to confirm a satisfactory hemostasis.  Retractors and sponges are removed. Under fascia hemostasis is completed with cauterization. The fascia is then closed with 2 running sutures of 0 Vicryl meeting midline. The wound is irrigated with warm saline and hemostasis is completed with cauterization. The skin is closed with a subcuticular suture of 3-0 Monocryl and Steri-Strips.  Instrument and sponge count is complete x2. Estimated blood loss is 800 cc.  The procedure is well tolerated by the patient who is taken to recovery room in a well and stable condition.  female baby named Lauren Pineda was born at 8:28.Apgars are pending.   Specimen: Placenta sent to [facility]   [name] A MD 5/31/20179:06 AM

## 2016-02-16 LAB — CBC
HEMATOCRIT: 25.6 % — AB (ref 36.0–46.0)
HEMOGLOBIN: 8.3 g/dL — AB (ref 12.0–15.0)
MCH: 28.9 pg (ref 26.0–34.0)
MCHC: 32.4 g/dL (ref 30.0–36.0)
MCV: 89.2 fL (ref 78.0–100.0)
Platelets: 217 10*3/uL (ref 150–400)
RBC: 2.87 MIL/uL — AB (ref 3.87–5.11)
RDW: 15 % (ref 11.5–15.5)
WBC: 9.5 10*3/uL (ref 4.0–10.5)

## 2016-02-16 LAB — BIRTH TISSUE RECOVERY COLLECTION (PLACENTA DONATION)

## 2016-02-16 NOTE — Progress Notes (Signed)
Subjective: Postpartum Day 1: Cesarean Delivery Patient reports tolerating PO and no problems voiding.  She has ambulated and denies light headedness or dizziness. Scant vaginal bleeding. Pain well controlled.   Objective: Vital signs in last 24 hours: Temp:  [97.7 F (36.5 C)-98.4 F (36.9 C)] 98.4 F (36.9 C) (06/01 0830) Pulse Rate:  [54-70] 61 (06/01 0830) Resp:  [16-20] 18 (06/01 0830) BP: (87-116)/(55-69) 107/59 mmHg (06/01 0830) SpO2:  [95 %-100 %] 99 % (06/01 0830)  Physical Exam:  General: alert, cooperative and no distress Lochia: appropriate Uterine Fundus: firm Incision: with pressure dressing, clean, dry, intact DVT Evaluation: No evidence of DVT seen on physical exam.   Recent Labs  02/14/16 1115 02/16/16 0540  HGB 9.2* 8.3*  HCT 28.1* 25.6*    Assessment/Plan: Status post Cesarean section. Doing well postoperatively.  Continue current care. Iron tablets for anemia  [facility] CenterKULWA,Sherae Santino WAKURU 02/16/2016, 10:28 AM

## 2016-02-16 NOTE — Progress Notes (Signed)
Pt was told to remove pressure dressing in shower but not the honeycomb. Pt complained of pain after the shower and was medicated with scheduled motrin and percocet. Checked incision. Pt had removed both pressure dressing and honeycomb and pt had removed her steri strips too. RN placed new honeycomb dressing on with sterile gloves @ 1830 p

## 2016-02-17 NOTE — Progress Notes (Signed)
Lauren Pineda [id number]  Subjective: Postpartum Day 2: Repeat C/S x 4 Patient up ad lib, reports no syncope or dizziness.  Using pain med with benefit. Feeding:  Formula Contraceptive plan:  Still undecided  Objective: Temp:  [98.2 F (36.8 C)-98.4 F (36.9 C)] 98.3 F (36.8 C) (06/02 0446) Pulse Rate:  [59-61] 59 (06/02 0446) Resp:  [18] 18 (06/02 0446) BP: (107-118)/(59-69) 112/69 mmHg (06/02 0446) SpO2:  [99 %] 99 % (06/01 0830)  CBC Latest Ref Rng 02/16/2016 02/14/2016 10/30/2015  WBC 4.0 - 10.5 K/uL 9.5 12.0(H) 11.0(H)  Hemoglobin 12.0 - 15.0 g/dL 8.3(L) 9.2(L) 10.2(L)  Hematocrit 36.0 - 46.0 % 25.6(L) 28.1(L) 31.5(L)  Platelets 150 - 400 K/uL 217 227 267   Orthostatics WNL. On Fe BID  Physical Exam:  General: alert Lochia: appropriate Uterine Fundus: firm Abdomen:  + bowel sounds Incision: Honeycomb dressing CDI DVT Evaluation: No evidence of DVT seen on physical exam. Negative Homan's sign.  Assessment/Plan: Status post cesarean delivery, day 2. Stable Continue current care. Plan for discharge tomorrow--declines d/c today. Reviewed contraceptive options, still undecided.    Nigel BridgemanLATHAM, Kiersten Coss MSN, CNM 02/17/2016, 7:56 AM

## 2016-02-18 MED ORDER — IBUPROFEN 600 MG PO TABS: 600.0000 mg | ORAL_TABLET | Freq: Four times a day (QID) | ORAL | Status: DC | PRN

## 2016-02-18 MED ORDER — OXYCODONE-ACETAMINOPHEN 5-325 MG PO TABS: 1.0000 | ORAL_TABLET | ORAL | Status: DC | PRN

## 2016-02-18 NOTE — Discharge Summary (Signed)
Obstetric Discharge Summary Reason for Admission: cesarean section Prenatal Procedures: ultrasound Intrapartum Procedures: cesarean: low cervical, transverse Postpartum Procedures: none Complications-Operative and Postpartum: none HEMOGLOBIN  Date Value Ref Range Status  02/16/2016 8.3* 12.0 - 15.0 g/dL Final   HCT  Date Value Ref Range Status  02/16/2016 25.6* 36.0 - 46.0 % Final    Physical Exam:  General: alert and cooperative Lochia: appropriate Uterine Fundus: firm Incision: healing well, no significant drainage, no dehiscence DVT Evaluation: No evidence of DVT seen on physical exam.  Discharge Diagnoses: Term Pregnancy-delivered.  Hospital Course:   The patient came in labor and had a CS for Prior Cesarean Section by Silverio LaySandra Rivard MD.  Post operatively she did well.  She tolerated a regular diet and her exam is WNL and documented in the chart. .  She has recovered well and is ready for discharge.  She is breast feeding and will use Nexplanon for [facility].     Discharge Information: Date: 02/18/2016 Activity: pelvic rest Diet: routine Medications: PNV, Ibuprofen and Percocet Condition: stable Instructions: refer to practice specific booklet Discharge to: home Follow-up Information    [facility]. Schedule an appointment as soon as possible for a visit in 4 weeks.   Specialty:  Obstetrics and Gynecology   Contact information:   [address] [place] [phone]      Newborn Data: Live born female  Birth Weight: 7 lb 6.2 oz (3350 g) APGAR: 9, 9  Home with mother.  Lauren Pineda A 02/18/2016, 12:37 PM

## 2016-02-18 NOTE — Discharge Instructions (Signed)
Cesarean Delivery Cesarean delivery is the birth of a baby through a cut (incision) in the abdomen and womb (uterus).  LET [facility] CARE PROVIDER KNOW ABOUT:  All medicines you are taking, including vitamins, herbs, eye drops, creams, and over-the-counter medicines.  Previous problems you or members of your family have had with the use of anesthetics.  Any bleeding or blood clotting disorders you have.  Family history of blood clots or bleeding disorders.  Any history of deep vein thrombosis (DVT) or pulmonary embolism (PE).  Previous surgeries you have had.  Medical conditions you have.  Any allergies you have.  Complicationsinvolving the pregnancy. RISKS AND COMPLICATIONS  Generally, this is a safe procedure. However, as with any procedure, complications can occur. Possible complications include:  Bleeding.  Infection.  Blood clots.  Injury to surrounding organs.  Problems with anesthesia.  Injury to the baby. BEFORE THE PROCEDURE   You may be given an antacid medicine to drink. This will prevent acid contents in your stomach from going into your lungs if you vomit during the surgery.  You may be given an antibiotic medicine to prevent infection. PROCEDURE   To prevent infection of your incision:  Hair may be removed from your pubic area if it is near your incision.  The skin of your pubic area and lower abdomen will be cleaned with a germ-killing solution (antiseptic).  A tube (Foley catheter) will be placed in your bladder to drain your urine from your bladder into a bag. This keeps your bladder empty during surgery.  An IV tube will be placed in your vein.  You may be given medicine to numb the lower half of your body (regional anesthetic). If you were in labor, you may have already had an epidural in place which can be used in both labor and cesarean delivery. You may possibly be given medicine to make you sleep (general anesthetic) though this is not as  common.  Your heart rate and your baby's heart rate will be monitored.  An incision will be made in your abdomen that extends to your uterus. There are 2 basic kinds of incisions:  The horizontal (transverse) incision. Horizontal incisions are from side to side and are used for most routine cesarean deliveries.  The vertical incision. The vertical incision is from the top of the abdomen to the bottom and is less commonly used. It is often done for women who have a serious complication (extreme prematurity) or under emergency situations.  The horizontal and vertical incisions may both be used at the same time. However, this is very uncommon.  An incision is then made in your uterus to deliver the baby.  Your baby will be delivered.  Your health care provider may place the baby on your chest. It is important to keep the baby warm. Your health care provider will dry off the baby, place the baby directly on your bare skin, and cover the baby with warm, dry blankets.  Both incisions will be closed with absorbable stitches. AFTER THE PROCEDURE   If you were awake during the surgery, you will see your baby right away. If you were asleep, you will see your baby as soon as you are awake.  You may breastfeed your baby after surgery.  You may be able to get up and walk the same day as the surgery. If you need to stay in bed for a period of time, you will receive help to turn, cough, and take deep breaths after  surgery. This helps prevent lung problems such as pneumonia.  Do not get out of bed alone the first time after surgery. You will need help getting out of bed until you are able to do this by yourself.  You may be able to shower the day after your cesarean delivery. After the bandage (dressing) is taken off the incision site, a nurse will assist you to shower if you would like help.  You may be directed to take actions to help prevent blood clots in your legs. These may  include:  Walking shortly after surgery, with someone assisting you. Moving around after surgery helps to improve blood flow.  Wearing compression stockings or using different types of devices.  Taking medicines to thin your blood (anticoagulants) if you are at high risk for DVT or PE.  Save any blood clots that you pass from your vagina. If you pass a clot while on the toilet, do not flush it. Call for the nurse. Tell the nurse if you think you are bleeding too much or passing too many clots.  You will be given medicine for pain and nausea as needed. Let your health care providers know if you are hurting. You may also be given an antibiotic to prevent an infection.  Your IV tube will be taken out when you are drinking a reasonable amount of fluids. The Foley catheter is taken out when you are up and walking.  If your blood type is Rh negative and your baby's blood type is Rh positive, you will be given a shot of anti-D immune globulin. This shot prevents you from having Rh problems with a future pregnancy. You should get the shot even if you had your tubes tied (tubal ligation).  If you are allowed to take the baby for a walk, place the baby in the bassinet and push it.   This information is not intended to replace advice given to you by your health care provider. Make sure you discuss any questions you have with your health care provider.   Document Released: 09/03/2005 Document Revised: 05/25/2015 Document Reviewed: 04/30/2012 Elsevier Interactive Patient Education 2016 [place] is this medicine? ETONOGESTREL (et oh noe JES trel) is a contraceptive (birth control) device. It is used to prevent pregnancy. It can be used for up to 3 years. This medicine may be used for other purposes; ask your health care provider or pharmacist if you have questions. What should I tell my health care provider before I take this medicine? They need to know if you have any of  these conditions: -abnormal vaginal bleeding -blood vessel disease or blood clots -cancer of the breast, cervix, or liver -depression -diabetes -gallbladder disease -headaches -heart disease or recent heart attack -high blood pressure -high cholesterol -kidney disease -liver disease -renal disease -seizures -tobacco smoker -an unusual or allergic reaction to etonogestrel, other hormones, anesthetics or antiseptics, medicines, foods, dyes, or preservatives -pregnant or trying to get pregnant -breast-feeding How should I use this medicine? This device is inserted just under the skin on the inner side of your upper arm by a health care professional. Talk to your pediatrician regarding the use of this medicine in children. Special care may be needed. Overdosage: If you think you have taken too much of this medicine contact a poison control center or emergency room at once. NOTE: This medicine is only for you. Do not share this medicine with others. What if I miss a dose? This does not apply.  What may interact with this medicine? Do not take this medicine with any of the following medications: -amprenavir -bosentan -fosamprenavir This medicine may also interact with the following medications: -barbiturate medicines for inducing sleep or treating seizures -certain medicines for fungal infections like ketoconazole and itraconazole -griseofulvin -medicines to treat seizures like carbamazepine, felbamate, oxcarbazepine, phenytoin, topiramate -modafinil -phenylbutazone -rifampin -some medicines to treat HIV infection like atazanavir, indinavir, lopinavir, nelfinavir, tipranavir, ritonavir -St. John's wort This list may not describe all possible interactions. Give your health care provider a list of all the medicines, herbs, non-prescription drugs, or dietary supplements you use. Also tell them if you smoke, drink alcohol, or use illegal drugs. Some items may interact with your  medicine. What should I watch for while using this medicine? This product does not protect you against HIV infection (AIDS) or other sexually transmitted diseases. You should be able to feel the implant by pressing your fingertips over the skin where it was inserted. Contact your doctor if you cannot feel the implant, and use a non-hormonal birth control method (such as condoms) until your doctor confirms that the implant is in place. If you feel that the implant may have broken or become bent while in your arm, contact your healthcare provider. What side effects may I notice from receiving this medicine? Side effects that you should report to your doctor or health care professional as soon as possible: -allergic reactions like skin rash, itching or hives, swelling of the face, lips, or tongue -breast lumps -changes in emotions or moods -depressed mood -heavy or prolonged menstrual bleeding -pain, irritation, swelling, or bruising at the insertion site -scar at site of insertion -signs of infection at the insertion site such as fever, and skin redness, pain or discharge -signs of pregnancy -signs and symptoms of a blood clot such as breathing problems; changes in vision; chest pain; severe, sudden headache; pain, swelling, warmth in the leg; trouble speaking; sudden numbness or weakness of the face, arm or leg -signs and symptoms of liver injury like dark yellow or brown urine; general ill feeling or flu-like symptoms; light-colored stools; loss of appetite; nausea; right upper belly pain; unusually weak or tired; yellowing of the eyes or skin -unusual vaginal bleeding, discharge -signs and symptoms of a stroke like changes in vision; confusion; trouble speaking or understanding; severe headaches; sudden numbness or weakness of the face, arm or leg; trouble walking; dizziness; loss of balance or coordination Side effects that usually do not require medical attention (Report these to your doctor or  health care professional if they continue or are bothersome.): -acne -back pain -breast pain -changes in weight -dizziness -general ill feeling or flu-like symptoms -headache -irregular menstrual bleeding -nausea -sore throat -vaginal irritation or inflammation This list may not describe all possible side effects. Call your doctor for medical advice about side effects. You may report side effects to FDA at 1-800-FDA-1088. Where should I keep my medicine? This drug is given in a hospital or clinic and will not be stored at home. NOTE: This sheet is a summary. It may not cover all possible information. If you have questions about this medicine, talk to your doctor, pharmacist, or health care provider.    2016, Elsevier/Gold Standard. (2014-06-18 14:07:06)  Iron-Rich Diet  Iron is a mineral that helps your body to produce hemoglobin. Hemoglobin is a protein in your red blood cells that carries oxygen to your body's tissues. Eating too little iron may cause you to feel weak and tired, and it  can increase your risk for infection. Eating enough iron is necessary for your body's metabolism, muscle function, and nervous system. Iron is naturally found in many foods. It can also be added to foods or fortified in foods. There are two types of dietary iron:  Heme iron. Heme iron is absorbed by the body more easily than nonheme iron. Heme iron is found in meat, poultry, and fish.  Nonheme iron. Nonheme iron is found in dietary supplements, iron-fortified grains, beans, and vegetables. You may need to follow an iron-rich diet if:  You have been diagnosed with iron deficiency or iron-deficiency anemia.  You have a condition that prevents you from absorbing dietary iron, such as:  Infection in your intestines.  Celiac disease. This involves long-lasting (chronic) inflammation of your intestines.  You do not eat enough iron.  You eat a diet that is high in foods that impair iron  absorption.  You have lost a lot of blood.  You have heavy bleeding during your menstrual cycle.  You are pregnant. WHAT IS MY PLAN? Your health care provider may help you to determine how much iron you need per day based on your condition. Generally, when a person consumes sufficient amounts of iron in the diet, the following iron needs are met:  Men.  108-85 years old: 11 mg per day.  39-57 years old: 8 mg per day.  Women.   67-44 years old: 15 mg per day.  87-17 years old: 18 mg per day.  Over 54 years old: 8 mg per day.  Pregnant women: 27 mg per day.  Breastfeeding women: 9 mg per day. WHAT DO I NEED TO KNOW ABOUT AN IRON-RICH DIET?  Eat fresh fruits and vegetables that are high in vitamin C along with foods that are high in iron. This will help increase the amount of iron that your body absorbs from food, especially with foods containing nonheme iron. Foods that are high in vitamin C include oranges, peppers, tomatoes, and mango.  Take iron supplements only as directed by your health care provider. Overdose of iron can be life-threatening. If you were prescribed iron supplements, take them with orange juice or a vitamin C supplement.  Cook foods in pots and pans that are made from iron.   Eat nonheme iron-containing foods alongside foods that are high in heme iron. This helps to improve your iron absorption.   Certain foods and drinks contain compounds that impair iron absorption. Avoid eating these foods in the same meal as iron-rich foods or with iron supplements. These include:  Coffee, black tea, and red wine.  Milk, dairy products, and foods that are high in calcium.  Beans, soybeans, and peas.  Whole grains.  When eating foods that contain both nonheme iron and compounds that impair iron absorption, follow these tips to absorb iron better.   Soak beans overnight before cooking.  Soak whole grains overnight and drain them before using.  Ferment  flours before baking, such as using yeast in bread dough. WHAT FOODS CAN I EAT? Grains Iron-fortified breakfast cereal. Iron-fortified whole-wheat bread. Enriched rice. Sprouted grains. Vegetables Spinach. Potatoes with skin. Green peas. Broccoli. Red and green bell peppers. Fermented vegetables. Fruits Prunes. Raisins. Oranges. Strawberries. Mango. Grapefruit. Meats and Other Protein Sources Beef liver. Oysters. Beef. Shrimp. [place]. Chicken. [place]. Sardines. Chickpeas. Nuts. Tofu. Beverages Tomato juice. Fresh orange juice. Prune juice. Hibiscus tea. Fortified instant breakfast shakes. Condiments Tahini. Fermented soy sauce. Sweets and Desserts Black-strap molasses.  Other Wheat germ. The  items listed above may not be a complete list of recommended foods or beverages. Contact your dietitian for more options. WHAT FOODS ARE NOT RECOMMENDED? Grains Whole grains. Bran cereal. Bran flour. Oats. Vegetables Artichokes. Brussels sprouts. Kale. Fruits Blueberries. Raspberries. Strawberries. Figs. Meats and Other Protein Sources Soybeans. Products made from soy protein. Dairy Milk. Cream. Cheese. Yogurt. Cottage cheese. Beverages Coffee. Black tea. Red wine. Sweets and Desserts Cocoa. Chocolate. Ice cream. Other Basil. Oregano. Parsley. The items listed above may not be a complete list of foods and beverages to avoid. Contact your dietitian for more information.   This information is not intended to replace advice given to you by your health care provider. Make sure you discuss any questions you have with your health care provider.   Document Released: 04/17/2005 Document Revised: 09/24/2014 Document Reviewed: 03/31/2014 Elsevier Interactive Patient Education [employer].

## 2016-02-20 ENCOUNTER — Inpatient Hospital Stay (HOSPITAL_COMMUNITY)
Admission: AD | Admit: 2016-02-20 | Discharge: 2016-02-20 | Disposition: A | Discharge status: Home / Self Care | Payer: Medicaid Other | Source: Ambulatory Visit | Attending: Obstetrics and Gynecology | Admitting: Obstetrics and Gynecology

## 2016-02-20 ENCOUNTER — Encounter (HOSPITAL_COMMUNITY): Payer: Self-pay

## 2016-02-20 DIAGNOSIS — D649 Anemia, unspecified: Secondary | ICD-10-CM | POA: Diagnosis not present

## 2016-02-20 DIAGNOSIS — O9089 Other complications of the puerperium, not elsewhere classified: Secondary | ICD-10-CM | POA: Diagnosis not present

## 2016-02-20 DIAGNOSIS — R030 Elevated blood-pressure reading, without diagnosis of hypertension: Secondary | ICD-10-CM | POA: Insufficient documentation

## 2016-02-20 DIAGNOSIS — G8918 Other acute postprocedural pain: Secondary | ICD-10-CM | POA: Insufficient documentation

## 2016-02-20 DIAGNOSIS — L7682 Other postprocedural complications of skin and subcutaneous tissue: Secondary | ICD-10-CM

## 2016-02-20 DIAGNOSIS — Z98891 History of uterine scar from previous surgery: Secondary | ICD-10-CM

## 2016-02-20 DIAGNOSIS — Z87891 Personal history of nicotine dependence: Secondary | ICD-10-CM | POA: Insufficient documentation

## 2016-02-20 DIAGNOSIS — Z91040 Latex allergy status: Secondary | ICD-10-CM | POA: Diagnosis not present

## 2016-02-20 DIAGNOSIS — O9081 Anemia of the puerperium: Secondary | ICD-10-CM | POA: Diagnosis not present

## 2016-02-20 HISTORY — DX: Anemia, unspecified: D64.9

## 2016-02-20 LAB — CBC WITH DIFFERENTIAL/PLATELET
BASOS ABS: 0 10*3/uL (ref 0.0–0.1)
BASOS PCT: 0 %
Eosinophils Absolute: 0.2 10*3/uL (ref 0.0–0.7)
Eosinophils Relative: 3 %
HEMATOCRIT: 30.1 % — AB (ref 36.0–46.0)
HEMOGLOBIN: 9.6 g/dL — AB (ref 12.0–15.0)
Lymphocytes Relative: 22 %
Lymphs Abs: 1.9 10*3/uL (ref 0.7–4.0)
MCH: 28.5 pg (ref 26.0–34.0)
MCHC: 31.9 g/dL (ref 30.0–36.0)
MCV: 89.3 fL (ref 78.0–100.0)
MONO ABS: 0.2 10*3/uL (ref 0.1–1.0)
Monocytes Relative: 3 %
NEUTROS ABS: 6.5 10*3/uL (ref 1.7–7.7)
NEUTROS PCT: 72 %
PLATELETS: 316 10*3/uL (ref 150–400)
RBC: 3.37 MIL/uL — AB (ref 3.87–5.11)
RDW: 15.8 % — AB (ref 11.5–15.5)
WBC: 8.9 10*3/uL (ref 4.0–10.5)

## 2016-02-20 LAB — PROTEIN / CREATININE RATIO, URINE
Creatinine, Urine: 91 mg/dL
PROTEIN CREATININE RATIO: 0.1 mg/mg{creat} (ref 0.00–0.15)
Total Protein, Urine: 9 mg/dL

## 2016-02-20 LAB — COMPREHENSIVE METABOLIC PANEL
ALT: 16 U/L (ref 14–54)
AST: 21 U/L (ref 15–41)
Albumin: 2.8 g/dL — ABNORMAL LOW (ref 3.5–5.0)
Alkaline Phosphatase: 110 U/L (ref 38–126)
Anion gap: 6 (ref 5–15)
BILIRUBIN TOTAL: 0.8 mg/dL (ref 0.3–1.2)
BUN: 7 mg/dL (ref 6–20)
CALCIUM: 8.2 mg/dL — AB (ref 8.9–10.3)
CHLORIDE: 106 mmol/L (ref 101–111)
CO2: 24 mmol/L (ref 22–32)
CREATININE: 0.47 mg/dL (ref 0.44–1.00)
Glucose, Bld: 85 mg/dL (ref 65–99)
Potassium: 4.2 mmol/L (ref 3.5–5.1)
Sodium: 136 mmol/L (ref 135–145)
TOTAL PROTEIN: 5.8 g/dL — AB (ref 6.5–8.1)

## 2016-02-20 LAB — URIC ACID: URIC ACID, SERUM: 5.1 mg/dL (ref 2.3–6.6)

## 2016-02-20 LAB — LACTATE DEHYDROGENASE: LDH: 164 U/L (ref 98–192)

## 2016-02-20 MED ORDER — IBUPROFEN 800 MG PO TABS
800.0000 mg | ORAL_TABLET | Freq: Once | ORAL | Status: AC
  Administered 2016-02-20: 800 mg via ORAL
  Filled 2016-02-20: qty 1

## 2016-02-20 MED ORDER — IBUPROFEN 800 MG PO TABS: 800.0000 mg | ORAL_TABLET | Freq: Four times a day (QID) | ORAL | Status: DC | PRN

## 2016-02-20 MED ORDER — OXYCODONE-ACETAMINOPHEN 5-325 MG PO TABS: 1.0000 | ORAL_TABLET | ORAL | Status: DC | PRN

## 2016-02-20 MED ORDER — OXYCODONE-ACETAMINOPHEN 5-325 MG PO TABS
1.0000 | ORAL_TABLET | ORAL | Status: AC
  Administered 2016-02-20: 2 via ORAL
  Filled 2016-02-20: qty 2

## 2016-02-20 NOTE — MAU Note (Signed)
Feet are swollen.  Had c/s on 5/31, stomach and back hurt so bad. Voiding ok, has had one BM, feels like she needs to go, but nothing coming out.  Feels dizzy like she is going to pass out.

## 2016-02-20 NOTE — MAU Provider Note (Signed)
Lauren Pineda is a 30yo, [id number]  S/p LTCS on 02/15/16 and 6 days PP, presents to MAU unannounced for  surgical /abdominal and back pain.   C/o constipation, dizziness, blurred vision, and swollen feet but Denies headache, RUQ pain or issues with urination.  Reports taking Percocet and Ibuprofen for pain  "whenever I can" and states she is almost out of Percocet and only has "7 left".  When asked for more details regarding medication she is unable to verbalize how often she is taking them. Percocet was last taken at 3am and Ibuprofen at 5pm.  States she has not eaten since yesterday and does not have an appetite.      Pt reports taking care of her five children with limited partner and social support.    History     Patient Active Problem List   Diagnosis Date Noted  . Cesarean delivery delivered 02/15/2016  . Back pain affecting pregnancy 01/29/2016  . Sciatica of left side 01/07/2016  . Previous cesarean section x 3 12/19/2015  . H/O premature delivery x 2--one spontaneous, one induced 12/19/2015  . 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 History of prior pregnancy with IUGR newborn 12/19/2015  . Anemia 12/19/2015    Chief Complaint  Patient presents with  . Post-op Problem   HPI  OB History    Gravida Para Term Preterm AB TAB SAB Ectopic Multiple Living   '8 5 3 2 3  3  ' 0 5      Past Medical History  Diagnosis Date  . Medical history non-contributory   . Anemia     Past Surgical History  Procedure Laterality Date  . Cesarean section    . Cesarean section N/A 11/23/2012    Procedure: CESAREAN SECTION;  Surgeon: Frederico Hamman, MD;  Location: [facility];  Service: Obstetrics;  Laterality: N/A;  Repeat Cesarean Section Delivery Baby    @      , Apgars   . Dilation and evacuation N/A 04/30/2014    Procedure: DILATATION AND EVACUATION;  Surgeon: Frederico Hamman, MD;  Location: [facility];  Service: Gynecology;  Laterality: N/A;  . Cesarean  section N/A 02/15/2016    Procedure: CESAREAN SECTION;  Surgeon: Delsa Bern, MD;  Location: [place];  Service: Obstetrics;  Laterality: N/A;    Family History  Problem Relation Age of Onset  . Asthma Father   . Asthma Brother     Social History  Substance Use Topics  . Smoking status: Former Smoker -- 0.25 packs/day  . Smokeless tobacco: Never Used  . Alcohol Use: No    Allergies:  Allergies  Allergen Reactions  . Latex Itching    Prescriptions prior to admission  Medication Sig Dispense Refill Last Dose  . ibuprofen (ADVIL,MOTRIN) 600 MG tablet Take 1 tablet (600 mg total) by mouth every 6 (six) hours as needed for mild pain. 30 tablet 0 02/20/2016 at Unknown time  . oxyCODONE-acetaminophen (PERCOCET/ROXICET) 5-325 MG tablet Take 1 tablet by mouth every 4 (four) hours as needed (pain scale 4-7). 30 tablet 0 02/20/2016 at 0300  . cyclobenzaprine (FLEXERIL) 10 MG tablet Take 1 tablet (10 mg total) by mouth every 8 (eight) hours as needed for muscle spasms. (Patient not taking: Reported on 02/01/2016) 30 tablet 1 Not Taking at Unknown time    ROS Physical Exam   Blood pressure 150/93, pulse 57, temperature 98.9 F (37.2 C), temperature source Oral, resp. rate 20, not currently  breastfeeding.  Filed Vitals:   02/20/16 2020 02/20/16 2031 02/20/16 2046 02/20/16 2101  BP: 140/96 153/96 151/88 155/89  Pulse: 63 56 63 59  Temp:      TempSrc:      Resp:        Results for orders placed or performed during the hospital encounter of 02/20/16 (from the past 48 hour(s))  CBC with Differential/Platelet     Status: Abnormal   Collection Time: 02/20/16  7:17 PM  Result Value Ref Range   WBC 8.9 4.0 - 10.5 K/uL   RBC 3.37 (L) 3.87 - 5.11 MIL/uL   Hemoglobin 9.6 (L) 12.0 - 15.0 g/dL   HCT 30.1 (L) 36.0 - 46.0 %   MCV 89.3 78.0 - 100.0 fL   MCH 28.5 26.0 - 34.0 pg   MCHC 31.9 30.0 - 36.0 g/dL   RDW 15.8 (H) 11.5 - 15.5 %   Platelets 316 150 - 400 K/uL   Neutrophils  Relative % 72 %   Neutro Abs 6.5 1.7 - 7.7 K/uL   Lymphocytes Relative 22 %   Lymphs Abs 1.9 0.7 - 4.0 K/uL   Monocytes Relative 3 %   Monocytes Absolute 0.2 0.1 - 1.0 K/uL   Eosinophils Relative 3 %   Eosinophils Absolute 0.2 0.0 - 0.7 K/uL   Basophils Relative 0 %   Basophils Absolute 0.0 0.0 - 0.1 K/uL  Comprehensive metabolic panel     Status: Abnormal   Collection Time: 02/20/16  7:17 PM  Result Value Ref Range   Sodium 136 135 - 145 mmol/L   Potassium 4.2 3.5 - 5.1 mmol/L   Chloride 106 101 - 111 mmol/L   CO2 24 22 - 32 mmol/L   Glucose, Bld 85 65 - 99 mg/dL   BUN 7 6 - 20 mg/dL   Creatinine, Ser 0.47 0.44 - 1.00 mg/dL   Calcium 8.2 (L) 8.9 - 10.3 mg/dL   Total Protein 5.8 (L) 6.5 - 8.1 g/dL   Albumin 2.8 (L) 3.5 - 5.0 g/dL   AST 21 15 - 41 U/L   ALT 16 14 - 54 U/L   Alkaline Phosphatase 110 38 - 126 U/L   Total Bilirubin 0.8 0.3 - 1.2 mg/dL   GFR calc non Af Amer >60 >60 mL/min   GFR calc Af Amer >60 >60 mL/min    Comment: (NOTE) The eGFR has been calculated using the CKD EPI equation. This calculation has not been validated in all clinical situations. eGFR's persistently <60 mL/min signify possible Chronic Kidney Disease.    Anion gap 6 5 - 15  Uric acid     Status: None   Collection Time: 02/20/16  7:17 PM  Result Value Ref Range   Uric Acid, Serum 5.1 2.3 - 6.6 mg/dL  Lactate dehydrogenase     Status: None   Collection Time: 02/20/16  7:17 PM  Result Value Ref Range   LDH 164 98 - 192 U/L  Protein / creatinine ratio, urine     Status: None   Collection Time: 02/20/16  8:14 PM  Result Value Ref Range   Creatinine, Urine 91.00 mg/dL   Total Protein, Urine 9 mg/dL    Comment: NO NORMAL RANGE ESTABLISHED FOR THIS TEST   Protein Creatinine Ratio 0.10 0.00 - 0.15 mg/mg[Cre]    Physical Exam  Constitutional: She is oriented to person, place, and time. She appears well-developed and well-nourished. She appears distressed.  HENT:  Head: Normocephalic.  Eyes:  Pupils are equal,  round, and reactive to light.  Neck: Normal range of motion.  Cardiovascular: Normal rate.   Respiratory: Effort normal.  GI: Soft. There is tenderness. There is no guarding.  Appropriately tender  Musculoskeletal: Normal range of motion. She exhibits edema.  Minimal non pitting pedal edema   Neurological: She is alert and oriented to person, place, and time. She has normal reflexes.  Skin: Skin is warm and dry.  Psychiatric: She has a normal mood and affect. Her behavior is normal.    ED Course  Assessment: Incisional Pain Incision C/D/I Elevated BP,  140-150/80-90's PIh labs wnl, PCS .10 Anemia   Plan: DC home in stable condition Pain management strategy discussed- alternate ibuprofen and Percocet Relaxation/rest recommended - pt reports taking care of all 5 children  Increase water intake, Iron and Fiber rich foods recommended F/u in office for BP check/Pain management check in 1 week Call PRN   Lavetta Nielsen CNM, MSN 02/20/2016 7:53 PM

## 2016-02-20 NOTE — Discharge Instructions (Signed)
Incision Care °An incision is when a surgeon cuts into your body. After surgery, the incision needs to be cared for properly to prevent infection.  °HOW TO CARE FOR YOUR INCISION °· Take medicines only as directed by your health care provider. °· There are many different ways to close and cover an incision, including stitches, skin glue, and adhesive strips. Follow your health care provider's instructions on: °¨ Incision care. °¨ Bandage (dressing) changes and removal. °¨ Incision closure removal. °· Do not take baths, swim, or use a hot tub until your health care provider approves. You may shower as directed by your health care provider. °· Resume your normal diet and activities as directed. °· Use anti-itch medicine (such as an antihistamine) as directed by your health care provider. The incision may itch while it is healing. Do not pick or scratch at the incision. °· Drink enough fluid to keep your urine clear or pale yellow. °SEEK MEDICAL CARE IF:  °· You have drainage, redness, swelling, or pain at your incision site. °· You have muscle aches, chills, or a general ill feeling. °· You notice a bad smell coming from the incision or dressing. °· Your incision edges separate after the sutures, staples, or skin adhesive strips have been removed. °· You have persistent nausea or vomiting. °· You have a fever. °· You are dizzy. °SEEK IMMEDIATE MEDICAL CARE IF:  °· You have a rash. °· You faint. °· You have difficulty breathing. °MAKE SURE YOU:  °· Understand these instructions. °· Will watch your condition. °· Will get help right away if you are not doing well or get worse. °  °This information is not intended to replace advice given to you by your health care provider. Make sure you discuss any questions you have with your health care provider. °  °Document Released: 03/23/2005 Document Revised: 09/24/2014 Document Reviewed: 10/28/2013 °Elsevier Interactive Patient Education ©2016 [employer] ° °

## 2016-02-20 NOTE — MAU Note (Signed)
Urine sent to lab 

## 2016-02-22 ENCOUNTER — Encounter (HOSPITAL_COMMUNITY): Payer: Self-pay | Admitting: *Deleted

## 2016-02-22 ENCOUNTER — Emergency Department (HOSPITAL_COMMUNITY)
Admission: EM | Admit: 2016-02-22 | Discharge: 2016-02-22 | Disposition: A | Discharge status: Home / Self Care | Payer: Medicaid Other | Attending: Dermatology | Admitting: Dermatology

## 2016-02-22 DIAGNOSIS — I10 Essential (primary) hypertension: Secondary | ICD-10-CM | POA: Insufficient documentation

## 2016-02-22 DIAGNOSIS — Z87891 Personal history of nicotine dependence: Secondary | ICD-10-CM | POA: Insufficient documentation

## 2016-02-22 DIAGNOSIS — Z79899 Other long term (current) drug therapy: Secondary | ICD-10-CM | POA: Insufficient documentation

## 2016-02-22 DIAGNOSIS — Z5321 Procedure and treatment not carried out due to patient leaving prior to being seen by health care provider: Secondary | ICD-10-CM | POA: Insufficient documentation

## 2016-02-22 LAB — CBC
HEMATOCRIT: 32.5 % — AB (ref 36.0–46.0)
HEMOGLOBIN: 10.2 g/dL — AB (ref 12.0–15.0)
MCH: 28.3 pg (ref 26.0–34.0)
MCHC: 31.4 g/dL (ref 30.0–36.0)
MCV: 90 fL (ref 78.0–100.0)
Platelets: 359 10*3/uL (ref 150–400)
RBC: 3.61 MIL/uL — ABNORMAL LOW (ref 3.87–5.11)
RDW: 15.7 % — ABNORMAL HIGH (ref 11.5–15.5)
WBC: 7.7 10*3/uL (ref 4.0–10.5)

## 2016-02-22 LAB — COMPREHENSIVE METABOLIC PANEL
ALK PHOS: 113 U/L (ref 38–126)
ALT: 35 U/L (ref 14–54)
AST: 23 U/L (ref 15–41)
Albumin: 2.7 g/dL — ABNORMAL LOW (ref 3.5–5.0)
Anion gap: 5 (ref 5–15)
BUN: 5 mg/dL — ABNORMAL LOW (ref 6–20)
CALCIUM: 9 mg/dL (ref 8.9–10.3)
CO2: 27 mmol/L (ref 22–32)
CREATININE: 0.67 mg/dL (ref 0.44–1.00)
Chloride: 108 mmol/L (ref 101–111)
GFR calc Af Amer: >60 mL/min (ref >60)
GFR calc non Af Amer: >60 mL/min (ref >60)
GLUCOSE: 94 mg/dL (ref 65–99)
Potassium: 4.2 mmol/L (ref 3.5–5.1)
Sodium: 140 mmol/L (ref 135–145)
Total Bilirubin: 0.6 mg/dL (ref 0.3–1.2)
Total Protein: 5.8 g/dL — ABNORMAL LOW (ref 6.5–8.1)

## 2016-02-22 LAB — LIPASE, BLOOD: LIPASE: 17 U/L (ref 11–51)

## 2016-02-22 NOTE — ED Notes (Signed)
The pt has a c-section the end of mary  She came home Saturday  Since she came home her bp has been elevated.  [facility]  She was given no meds

## 2016-02-22 NOTE — ED Notes (Signed)
Headache initially now only has incisional pain  Her mother with her reports that the c-section does not appera to be healing  As well as it should

## 2016-02-22 NOTE — ED Notes (Signed)
Pt states that she is leaving.  

## 2016-02-22 NOTE — ED Notes (Signed)
Swelling in her lower extremities until lasy monday

## 2016-06-12 ENCOUNTER — Encounter: Payer: Self-pay | Admitting: *Deleted

## 2016-11-15 DIAGNOSIS — K819 Cholecystitis, unspecified: Secondary | ICD-10-CM

## 2016-11-15 HISTORY — DX: Cholecystitis, unspecified: K81.9

## 2016-11-19 ENCOUNTER — Emergency Department (HOSPITAL_COMMUNITY)
Admission: EM | Admit: 2016-11-19 | Discharge: 2016-11-19 | Disposition: A | Discharge status: Home / Self Care | Payer: Self-pay | Attending: Emergency Medicine | Admitting: Emergency Medicine

## 2016-11-19 ENCOUNTER — Emergency Department (HOSPITAL_COMMUNITY): Payer: Self-pay

## 2016-11-19 ENCOUNTER — Encounter (HOSPITAL_COMMUNITY): Payer: Self-pay | Admitting: Emergency Medicine

## 2016-11-19 DIAGNOSIS — K8020 Calculus of gallbladder without cholecystitis without obstruction: Secondary | ICD-10-CM | POA: Insufficient documentation

## 2016-11-19 DIAGNOSIS — Z79899 Other long term (current) drug therapy: Secondary | ICD-10-CM | POA: Insufficient documentation

## 2016-11-19 DIAGNOSIS — Z87891 Personal history of nicotine dependence: Secondary | ICD-10-CM | POA: Insufficient documentation

## 2016-11-19 DIAGNOSIS — Z91040 Latex allergy status: Secondary | ICD-10-CM | POA: Insufficient documentation

## 2016-11-19 LAB — COMPREHENSIVE METABOLIC PANEL
ALT: 12 U/L — ABNORMAL LOW (ref 14–54)
AST: 18 U/L (ref 15–41)
Albumin: 3.7 g/dL (ref 3.5–5.0)
Alkaline Phosphatase: 65 U/L (ref 38–126)
Anion gap: 6 (ref 5–15)
BUN: 7 mg/dL (ref 6–20)
CO2: 26 mmol/L (ref 22–32)
Calcium: 8.8 mg/dL — ABNORMAL LOW (ref 8.9–10.3)
Chloride: 108 mmol/L (ref 101–111)
Creatinine, Ser: 0.68 mg/dL (ref 0.44–1.00)
GFR calc Af Amer: >60 mL/min (ref >60)
GFR calc non Af Amer: >60 mL/min (ref >60)
Glucose, Bld: 92 mg/dL (ref 65–99)
Potassium: 3.8 mmol/L (ref 3.5–5.1)
Sodium: 140 mmol/L (ref 135–145)
Total Bilirubin: 0.5 mg/dL (ref 0.3–1.2)
Total Protein: 6.9 g/dL (ref 6.5–8.1)

## 2016-11-19 LAB — LIPASE, BLOOD: Lipase: 37 U/L (ref 11–51)

## 2016-11-19 LAB — I-STAT BETA HCG BLOOD, ED (MC, WL, AP ONLY): I-stat hCG, quantitative: <5 m[IU]/mL (ref <5)

## 2016-11-19 LAB — CBC
HCT: 38.1 % (ref 36.0–46.0)
Hemoglobin: 12.3 g/dL (ref 12.0–15.0)
MCH: 29.4 pg (ref 26.0–34.0)
MCHC: 32.3 g/dL (ref 30.0–36.0)
MCV: 91.1 fL (ref 78.0–100.0)
Platelets: 355 10*3/uL (ref 150–400)
RBC: 4.18 MIL/uL (ref 3.87–5.11)
RDW: 14 % (ref 11.5–15.5)
WBC: 9.5 10*3/uL (ref 4.0–10.5)

## 2016-11-19 MED ORDER — KETOROLAC TROMETHAMINE 15 MG/ML IJ SOLN
15.0000 mg | Freq: Once | INTRAMUSCULAR | Status: AC
  Administered 2016-11-19: 15 mg via INTRAVENOUS
  Filled 2016-11-19: qty 1

## 2016-11-19 MED ORDER — OXYCODONE-ACETAMINOPHEN 5-325 MG PO TABS
2.0000 | ORAL_TABLET | Freq: Once | ORAL | Status: AC
  Administered 2016-11-19: 2 via ORAL
  Filled 2016-11-19: qty 2

## 2016-11-19 MED ORDER — ONDANSETRON HCL 4 MG PO TABS: 4.0000 mg | ORAL_TABLET | Freq: Three times a day (TID) | ORAL | 0 refills | Status: DC | PRN

## 2016-11-19 MED ORDER — HYDROMORPHONE HCL 2 MG/ML IJ SOLN
1.0000 mg | Freq: Once | INTRAMUSCULAR | Status: AC
  Administered 2016-11-19: 1 mg via INTRAVENOUS
  Filled 2016-11-19: qty 1

## 2016-11-19 MED ORDER — ONDANSETRON HCL 4 MG/2ML IJ SOLN
4.0000 mg | Freq: Once | INTRAMUSCULAR | Status: AC
  Administered 2016-11-19: 4 mg via INTRAVENOUS
  Filled 2016-11-19: qty 2

## 2016-11-19 MED ORDER — SODIUM CHLORIDE 0.9 % IV BOLUS (SEPSIS)
1000.0000 mL | Freq: Once | INTRAVENOUS | Status: AC
  Administered 2016-11-19: 1000 mL via INTRAVENOUS

## 2016-11-19 MED ORDER — HYDROMORPHONE HCL 2 MG/ML IJ SOLN
0.5000 mg | Freq: Once | INTRAMUSCULAR | Status: AC
  Administered 2016-11-19: 0.5 mg via INTRAVENOUS
  Filled 2016-11-19: qty 1

## 2016-11-19 MED ORDER — OXYCODONE-ACETAMINOPHEN 5-325 MG PO TABS
1.0000 | ORAL_TABLET | ORAL | 0 refills | Status: DC | PRN
Start: 2016-11-19 — End: 2016-12-06

## 2016-11-19 NOTE — ED Triage Notes (Signed)
Pt sts right sided abd pain and flank pain starting last night

## 2016-11-19 NOTE — ED Provider Notes (Signed)
[facility] Provider Note   CSN: [id number] Arrival date & time: 11/19/16  4098     History   Chief Complaint Chief Complaint  Patient presents with  . Abdominal Pain    HPI Lauren Pineda is a 31 y.o. female.  HPI   30yF with RUQ/R flank pain. Onset last night. Persistent since then. Waxes/wanes. Worse after eating, but has little appetitie. Nausea. No vomiting. No urinary complaints. No fever or chills. No respiratory complaints.   Past Medical History:  Diagnosis Date  . Anemia   . Medical history non-contributory     Patient Active Problem List   Diagnosis Date Noted  . Incisional pain 02/20/2016  . Cesarean delivery delivered 02/15/2016  . Back pain affecting pregnancy 01/29/2016  . Sciatica of left side 01/07/2016  . Previous cesarean section x 3 12/19/2015  . H/O premature delivery x 2--one spontaneous, one induced 12/19/2015  . 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 History of prior pregnancy with IUGR newborn 12/19/2015  . Anemia 12/19/2015    Past Surgical History:  Procedure Laterality Date  . CESAREAN SECTION    . CESAREAN SECTION N/A 11/23/2012   Procedure: CESAREAN SECTION;  Surgeon: Kathreen Cosier, MD;  Location: [facility];  Service: Obstetrics;  Laterality: N/A;  Repeat Cesarean Section Delivery Baby    @      , Apgars   . CESAREAN SECTION N/A 02/15/2016   Procedure: CESAREAN SECTION;  Surgeon: Silverio Lay, MD;  Location: [facility] BIRTHING SUITES;  Service: Obstetrics;  Laterality: N/A;  . DILATION AND EVACUATION N/A 04/30/2014   Procedure: DILATATION AND EVACUATION;  Surgeon: Kathreen Cosier, MD;  Location: [facility];  Service: Gynecology;  Laterality: N/A;    OB History    Gravida Para Term Preterm AB Living   8 5 3 2 3 5    SAB TAB Ectopic Multiple Live Births   3     0 5       Home Medications    Prior to Admission medications   Medication Sig Start Date End Date Taking? Authorizing Provider    ibuprofen (ADVIL,MOTRIN) 800 MG tablet Take 1 tablet (800 mg total) by mouth every 6 (six) hours as needed. 02/20/16   Alphonzo Severance, CNM  oxyCODONE-acetaminophen (ROXICET) 5-325 MG tablet Take 1 tablet by mouth every 4 (four) hours as needed for severe pain. 02/20/16   Alphonzo Severance, CNM    Family History Family History  Problem Relation Age of Onset  . Asthma Father   . Asthma Brother     Social History Social History  Substance Use Topics  . Smoking status: Former Smoker    Packs/day: 0.25  . Smokeless tobacco: Never Used  . Alcohol use No     Allergies   Latex   Review of Systems Review of Systems  All systems reviewed and negative, other than as noted in HPI.   Physical Exam Updated Vital Signs BP 111/95   Pulse 67   Temp 97.8 F (36.6 C)   Resp 22   Ht 5\' 4"  (1.626 m)   Wt 180 lb (81.6 kg)   SpO2 98%   BMI 30.90 kg/m   Physical Exam  Constitutional: She appears well-developed and well-nourished. No distress.  HENT:  Head: Normocephalic and atraumatic.  Eyes: Conjunctivae are normal. Right eye exhibits no discharge. Left eye exhibits no discharge.  Neck: Neck supple.  Cardiovascular: Normal rate, regular rhythm and normal heart sounds.  Exam reveals no gallop and no friction rub.   No murmur heard. Pulmonary/Chest: Effort normal and breath sounds normal. No respiratory distress.  Abdominal: Soft. She exhibits no distension. There is no tenderness.  Epigastric/RUQ tenderness w/o guarding  Musculoskeletal: She exhibits no edema or tenderness.  Neurological: She is alert.  Skin: Skin is warm and dry.  Psychiatric: She has a normal mood and affect. Her behavior is normal. Thought content normal.  Nursing note and vitals reviewed.    ED Treatments / Results  Labs (all labs ordered are listed, but only abnormal results are displayed) Labs Reviewed  COMPREHENSIVE METABOLIC PANEL - Abnormal; Notable for the following:       Result Value   Calcium 8.8 (*)     ALT 12 (*)    All other components within normal limits  LIPASE, BLOOD  CBC  I-STAT BETA HCG BLOOD, ED ([facility], [facility], [facility] ONLY)    EKG  EKG Interpretation None       Radiology [place] Abdomen Limited  Result Date: 11/19/2016 CLINICAL DATA:  Onset of right upper quadrant pain last night EXAM: US ABDOMEN LIMITED - RIGHT UPPER QUADRANT COMPARISON:  Abdominopelvic CT scan of April 30, 2014 FINDINGS: Gallbladder: The gallbladder is filled with stones producing a wall echo shadow. The maximal stone diameter observed is 1.4 cm. There is no gallbladder wall thickening, pericholecystic fluid, or positive sonographic Murphy's sign. Common bile duct: Diameter: 4.9 mm.  No abnormal intraluminal echoes are observed. Liver: The hepatic echotexture is normal. There is no focal mass nor ductal dilation. IMPRESSION: Multiple gallstones. There is no sonographic evidence of acute cholecystitis. Normal appearance of the liver and common bile duct. Electronically Signed   By: David  [place]azil[place] M.D.   On: 11/19/2016 09:23    Procedures Procedures (including critical care time)  Medications Ordered in ED Medications  sodium chloride 0.9 % bolus 1,000 mL (1,000 mLs Intravenous New Bag/Given 11/19/16 0845)  HYDROmorphone (DILAUDID) injection 1 mg (1 mg Intravenous Given 11/19/16 0846)  ondansetron (ZOFRAN) injection 4 mg (4 mg Intravenous Given 11/19/16 0846)     Initial Impression / Assessment and Plan / ED Course  I have reviewed the triage vital signs and the nursing notes.  Pertinent labs & imaging results that were available during my care of the patient were reviewed by me and considered in my medical decision making (see chart for details).     31 year old female with right upper quadrant abdominal pain. Clinical suspicion for symptomatic cholelithiasis. Ultrasound of the right upper quadrant with multiple stones within the gallbladder. No GB wall thickening or pericholecystic fluid. LFTs and lipase are normal. PRN  pain/nausea medications. General surgery FU.   Final Clinical Impressions(s) / ED Diagnoses   Final diagnoses:  Calculus of gallbladder without cholecystitis without obstruction    New Prescriptions New Prescriptions   No medications on file     Raeford RazorStephen Syretta Kochel, MD 12/04/16 1236

## 2016-11-19 NOTE — ED Notes (Signed)
Patient transported to Ultrasound 

## 2016-11-19 NOTE — Discharge Planning (Signed)
Ora Mcnatt J. Lucretia RoersWood, RN, BSN, [place] [phone] [facility].  Spoke with pt at bedside and advised to please arrive 15 min early and take a picture ID and your current medications.  Pt verbalizes understanding of keeping appointment.  Provided pt with the orange card informational brochure.  No further CM needs identified at this time.

## 2016-11-19 NOTE — ED Notes (Signed)
Lauren MallardCamille, case management speaking with pt.

## 2016-11-21 ENCOUNTER — Inpatient Hospital Stay (INDEPENDENT_AMBULATORY_CARE_PROVIDER_SITE_OTHER): Payer: Medicaid Other | Admitting: Physician Assistant

## 2016-11-30 ENCOUNTER — Emergency Department (HOSPITAL_COMMUNITY)
Admission: EM | Admit: 2016-11-30 | Discharge: 2016-11-30 | Disposition: A | Discharge status: Home / Self Care | Payer: Medicaid Other | Attending: Emergency Medicine | Admitting: Emergency Medicine

## 2016-11-30 ENCOUNTER — Ambulatory Visit: Payer: Self-pay | Admitting: General Surgery

## 2016-11-30 ENCOUNTER — Encounter (HOSPITAL_COMMUNITY): Payer: Self-pay

## 2016-11-30 DIAGNOSIS — K8050 Calculus of bile duct without cholangitis or cholecystitis without obstruction: Secondary | ICD-10-CM | POA: Insufficient documentation

## 2016-11-30 DIAGNOSIS — Z91040 Latex allergy status: Secondary | ICD-10-CM | POA: Insufficient documentation

## 2016-11-30 DIAGNOSIS — Z79899 Other long term (current) drug therapy: Secondary | ICD-10-CM | POA: Insufficient documentation

## 2016-11-30 DIAGNOSIS — F17200 Nicotine dependence, unspecified, uncomplicated: Secondary | ICD-10-CM | POA: Insufficient documentation

## 2016-11-30 LAB — URINALYSIS, ROUTINE W REFLEX MICROSCOPIC
BILIRUBIN URINE: NEGATIVE
GLUCOSE, UA: NEGATIVE mg/dL
KETONES UR: NEGATIVE mg/dL
Nitrite: NEGATIVE
PH: 8.5 — AB (ref 5.0–8.0)
Protein, ur: 100 mg/dL — AB
Specific Gravity, Urine: 1.02 (ref 1.005–1.030)

## 2016-11-30 LAB — CBC WITH DIFFERENTIAL/PLATELET
Basophils Absolute: 0 10*3/uL (ref 0.0–0.1)
Basophils Relative: 0 %
EOS ABS: 0.5 10*3/uL (ref 0.0–0.7)
EOS PCT: 7 %
HCT: 39.6 % (ref 36.0–46.0)
Hemoglobin: 12.6 g/dL (ref 12.0–15.0)
LYMPHS ABS: 3 10*3/uL (ref 0.7–4.0)
LYMPHS PCT: 41 %
MCH: 28.9 pg (ref 26.0–34.0)
MCHC: 31.8 g/dL (ref 30.0–36.0)
MCV: 90.8 fL (ref 78.0–100.0)
MONO ABS: 0.3 10*3/uL (ref 0.1–1.0)
Monocytes Relative: 5 %
Neutro Abs: 3.5 10*3/uL (ref 1.7–7.7)
Neutrophils Relative %: 47 %
PLATELETS: 345 10*3/uL (ref 150–400)
RBC: 4.36 MIL/uL (ref 3.87–5.11)
RDW: 13.8 % (ref 11.5–15.5)
WBC: 7.3 10*3/uL (ref 4.0–10.5)

## 2016-11-30 LAB — COMPREHENSIVE METABOLIC PANEL
ALBUMIN: 4 g/dL (ref 3.5–5.0)
ALT: 13 U/L — AB (ref 14–54)
AST: 18 U/L (ref 15–41)
Alkaline Phosphatase: 62 U/L (ref 38–126)
Anion gap: 7 (ref 5–15)
BILIRUBIN TOTAL: 0.5 mg/dL (ref 0.3–1.2)
BUN: 7 mg/dL (ref 6–20)
CO2: 26 mmol/L (ref 22–32)
CREATININE: 0.62 mg/dL (ref 0.44–1.00)
Calcium: 9.3 mg/dL (ref 8.9–10.3)
Chloride: 104 mmol/L (ref 101–111)
GFR calc Af Amer: >60 mL/min (ref >60)
GFR calc non Af Amer: >60 mL/min (ref >60)
GLUCOSE: 97 mg/dL (ref 65–99)
POTASSIUM: 4.2 mmol/L (ref 3.5–5.1)
Sodium: 137 mmol/L (ref 135–145)
Total Protein: 7 g/dL (ref 6.5–8.1)

## 2016-11-30 LAB — URINALYSIS, MICROSCOPIC (REFLEX)

## 2016-11-30 LAB — PREGNANCY, URINE: Preg Test, Ur: NEGATIVE

## 2016-11-30 LAB — LIPASE, BLOOD: Lipase: 16 U/L (ref 11–51)

## 2016-11-30 MED ORDER — ONDANSETRON 4 MG PO TBDP
ORAL_TABLET | ORAL | Status: AC
  Filled 2016-11-30: qty 1

## 2016-11-30 MED ORDER — ONDANSETRON 4 MG PO TBDP
4.0000 mg | ORAL_TABLET | Freq: Once | ORAL | Status: AC | PRN
  Administered 2016-11-30: 4 mg via ORAL

## 2016-11-30 MED ORDER — HYDROMORPHONE HCL 1 MG/ML IJ SOLN
1.0000 mg | Freq: Once | INTRAMUSCULAR | Status: AC
  Administered 2016-11-30: 1 mg via INTRAVENOUS
  Filled 2016-11-30: qty 1

## 2016-11-30 MED ORDER — SODIUM CHLORIDE 0.9 % IV BOLUS (SEPSIS)
500.0000 mL | Freq: Once | INTRAVENOUS | Status: AC
  Administered 2016-11-30: 500 mL via INTRAVENOUS

## 2016-11-30 MED ORDER — SODIUM CHLORIDE 0.9 % IV SOLN
INTRAVENOUS | Status: DC
  Administered 2016-11-30: 12:00:00 via INTRAVENOUS

## 2016-11-30 MED ORDER — HYDROCODONE-ACETAMINOPHEN 5-325 MG PO TABS: 1.0000 | ORAL_TABLET | Freq: Four times a day (QID) | ORAL | 0 refills | Status: DC | PRN

## 2016-11-30 NOTE — ED Notes (Signed)
ED Provider at bedside. 

## 2016-11-30 NOTE — ED Notes (Signed)
Pt verbalizes DC teaching and need to come to short stay on Monday at 0715, NPO after midnight. NAD. VSS. Pt being driven home by family.

## 2016-11-30 NOTE — ED Notes (Signed)
Pt reports she has been having gallbladder pain since 7 am today. Pt reports nausea and vomiting with pain in upper right abdomen and back pain. Hx of gallbladder pain.

## 2016-11-30 NOTE — ED Triage Notes (Signed)
Pt presents for evaluation of RUQ/back pain for over a week. Pt reports was seen here previously and dx with gallstones. States she was referred to a [facility] advisers[facility]geon but does not have insurance to afford surgery. Pt AxO x4. States has had some intermittent vomiting.

## 2016-11-30 NOTE — ED Notes (Signed)
Pt removed BP Cuff.

## 2016-11-30 NOTE — Discharge Instructions (Signed)
As you know both the emergency department and general surgery recommended that she be admitted. We understand the family situation. Take pain medicine as needed. General surgery will contact you or you contact them for follow-up to get the gallbladder removed. Pain persist fever persistent vomiting you need to return.  Just got word [facility] they have arranged for you to have your gallbladder removed on Monday morning [facility] 7:15 in the morning. Do not eat or drink anything after midnight.

## 2016-11-30 NOTE — ED Provider Notes (Addendum)
[facility] Provider Note   CSN: [id number] Arrival date & time: 11/30/16  1030     History   Chief Complaint Chief Complaint  Patient presents with  . Abdominal Pain  . Back Pain    HPI Lauren Pineda is a 31 y.o. female.  Patient seen in the emergency department March 5 the ultrasound showed multiple gallstones normal common bile duct. Patient was having symptoms at that time with pain in the right upper quadrant. Patient was referred to general surgery but has not followed up. Patient with recurrent abdominal pain right upper quadrant radiating to the back associated with nausea and vomiting this morning at 7 in the morning. Pain has persisted.      Past Medical History:  Diagnosis Date  . Anemia   . Medical history non-contributory     Patient Active Problem List   Diagnosis Date Noted  . Incisional pain 02/20/2016  . Cesarean delivery delivered 02/15/2016  . Back pain affecting pregnancy 01/29/2016  . Sciatica of left side 01/07/2016  . Previous cesarean section x 3 12/19/2015  . H/O premature delivery x 2--one spontaneous, one induced 12/19/2015  . 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 History of prior pregnancy with IUGR newborn 12/19/2015  . Anemia 12/19/2015    Past Surgical History:  Procedure Laterality Date  . CESAREAN SECTION    . CESAREAN SECTION N/A 11/23/2012   Procedure: CESAREAN SECTION;  Surgeon: Kathreen Cosier, MD;  Location: [facility];  Service: Obstetrics;  Laterality: N/A;  Repeat Cesarean Section Delivery Baby    @      , Apgars   . CESAREAN SECTION N/A 02/15/2016   Procedure: CESAREAN SECTION;  Surgeon: Silverio Lay, MD;  Location: [facility] BIRTHING SUITES;  Service: Obstetrics;  Laterality: N/A;  . DILATION AND EVACUATION N/A 04/30/2014   Procedure: DILATATION AND EVACUATION;  Surgeon: Kathreen Cosier, MD;  Location: [facility];  Service: Gynecology;  Laterality: N/A;    OB History    Gravida  Para Term Preterm AB Living   8 5 3 2 3 5    SAB TAB Ectopic Multiple Live Births   3     0 5       Home Medications    Prior to Admission medications   Medication Sig Start Date End Date Taking? Authorizing Provider  ibuprofen (ADVIL,MOTRIN) 800 MG tablet Take 1 tablet (800 mg total) by mouth every 6 (six) hours as needed. Patient not taking: Reported on 11/19/2016 02/20/16   Alphonzo Severance, CNM  ondansetron (ZOFRAN) 4 MG tablet Take 1 tablet (4 mg total) by mouth every 8 (eight) hours as needed for nausea or vomiting. Patient not taking: Reported on 11/30/2016 11/19/16   Raeford Razor, MD  oxyCODONE-acetaminophen (PERCOCET/ROXICET) 5-325 MG tablet Take 1-2 tablets by mouth every 4 (four) hours as needed for severe pain. Patient not taking: Reported on 11/30/2016 11/19/16   Raeford Razor, MD  oxyCODONE-acetaminophen (ROXICET) 5-325 MG tablet Take 1 tablet by mouth every 4 (four) hours as needed for severe pain. Patient not taking: Reported on 11/19/2016 02/20/16   Alphonzo Severance, CNM    Family History Family History  Problem Relation Age of Onset  . Asthma Father   . Asthma Brother     Social History Social History  Substance Use Topics  . Smoking status: Former Smoker    Packs/day: 0.25  . Smokeless tobacco: Never Used  . Alcohol use No     Allergies  Latex   Review of Systems Review of Systems  Constitutional: Negative for fever.  HENT: Negative for congestion.   Eyes: Negative for visual disturbance.  Respiratory: Negative for shortness of breath.   Cardiovascular: Negative for chest pain.  Gastrointestinal: Positive for abdominal pain, nausea and vomiting.  Genitourinary: Negative for dysuria.  Musculoskeletal: Positive for back pain.  Neurological: Negative for headaches.  Hematological: Does not bruise/bleed easily.  Psychiatric/Behavioral: Negative for confusion.     Physical Exam Updated Vital Signs BP 116/80   Pulse 61   Temp 98.1 F (36.7 C) (Oral)   Resp 18    Ht 5\' 4"  (1.626 m)   Wt 83.5 kg   LMP 11/23/2016 (Exact Date)   SpO2 97%   BMI 31.58 kg/m   Physical Exam  Constitutional: She is oriented to person, place, and time. She appears well-developed and well-nourished. No distress.  HENT:  Head: Normocephalic and atraumatic.  Mouth/Throat: Oropharynx is clear and moist.  Eyes: Conjunctivae and EOM are normal. Pupils are equal, round, and reactive to light.  Neck: Normal range of motion. Neck supple.  Cardiovascular: Normal rate, regular rhythm and normal heart sounds.   Pulmonary/Chest: Effort normal and breath sounds normal. No respiratory distress.  Abdominal: Soft. Bowel sounds are normal. There is tenderness.  Tenderness to palpation right upper quadrant.  Musculoskeletal: Normal range of motion.  Neurological: She is alert and oriented to person, place, and time. No cranial nerve deficit or sensory deficit. She exhibits normal muscle tone. Coordination normal.  Skin: Skin is warm.  Nursing note and vitals reviewed.    ED Treatments / Results  Labs (all labs ordered are listed, but only abnormal results are displayed) Labs Reviewed  COMPREHENSIVE METABOLIC PANEL - Abnormal; Notable for the following:       Result Value   ALT 13 (*)    All other components within normal limits  URINALYSIS, ROUTINE W REFLEX MICROSCOPIC - Abnormal; Notable for the following:    Color, Urine AMBER (*)    APPearance HAZY (*)    pH 8.5 (*)    Hgb urine dipstick LARGE (*)    Protein, ur 100 (*)    Leukocytes, UA TRACE (*)    All other components within normal limits  URINALYSIS, MICROSCOPIC (REFLEX) - Abnormal; Notable for the following:    Bacteria, UA FEW (*)    Squamous Epithelial / LPF 6-30 (*)    All other components within normal limits  CBC WITH DIFFERENTIAL/PLATELET  LIPASE, BLOOD  PREGNANCY, URINE    EKG  EKG Interpretation None       Radiology No results found.  Procedures Procedures (including critical care  time)  Medications Ordered in ED Medications  ondansetron (ZOFRAN-ODT) 4 MG disintegrating tablet (not administered)  0.9 %  sodium chloride infusion ( Intravenous New Bag/Given 11/30/16 1153)  ondansetron (ZOFRAN-ODT) disintegrating tablet 4 mg (4 mg Oral Given 11/30/16 1059)  sodium chloride 0.9 % bolus 500 mL (0 mLs Intravenous Stopped 11/30/16 1324)  HYDROmorphone (DILAUDID) injection 1 mg (1 mg Intravenous Given 11/30/16 1153)  HYDROmorphone (DILAUDID) injection 1 mg (1 mg Intravenous Given 11/30/16 1319)     Initial Impression / Assessment and Plan / ED Course  I have reviewed the triage vital signs and the nursing notes.  Pertinent labs & imaging results that were available during my care of the patient were reviewed by me and considered in my medical decision making (see chart for details).    Patient with known gallstones. Patient  was evaluated emergence Department ultrasound on March 5 showed multiple gallstones normal common bile duct. Patient was referred to general surgery but has not followed up. Patient was concerned that they would not see her due to insurance reasons. Patient had recurrent pain this morning at 7:00 right upper quadrant radiating to the back associated with nausea and vomiting. Pain is been persistent despite pain medicine. So patient has prolonged biliary colic with persistent the palpable tenderness in the right upper quadrant. Discussed with surgery they will see her in consultation. Patient's labs without significant abnormalities.  Based on the labs no immediate concern right now for acute cholecystitis. But since the pain is persisting with recommend the surgical evaluation and may very well admit for prolonged biliary colic.  Final Clinical Impressions(s) / ED Diagnoses   Final diagnoses:  Biliary colic    New Prescriptions New Prescriptions   No medications on file     Vanetta Mulders, MD 11/30/16 1502  Patient evaluated by general surgery Dr.  Dwain Sarna. Was willing to admit the patient has family concerned she needs to take care of she insisted on going home. I had that conversation with her try to talk her out of it but she is adamant she wants to go. They have arranged for her to have elective gallbladder removal on Monday she is to be nothing by mouth after midnight and [facility].    Vanetta Mulders, MD 11/30/16 1536

## 2016-11-30 NOTE — Consult Note (Signed)
[name] Surgery Consult Note  Lauren Pineda [birth year]  [id number].    Requesting MD: Fredia Sorrow Chief Complaint/Reason for Consult: RUQ pain  HPI:   Lauren Pineda is a 31 year old female with no significant PMHx.  She was recently seen in the ED on 11/19/16 for similar type pain. She was diagnosed with biliary colic secondary to gallstones using a RUQ ultrasound. The bile duct appeared normal and there was no evidence of cholecystitis. The patient was told to follow up outpatient, however she did not keep this appointment. She states that since leaving the ED on the 5th she has had intermittent RUQ pain . She notes that yesterday she began experiencing some nausea and one episode of vomiting. After eating a biscuit this morning she began experiencing a sharp, rated 10/10 RUQ pain that radiates to her back and did not subside. She also had another episode of vomiting this morning.   She is now resting with mild discomfort in the ED bed. She states that her pain has subsided currently to a 6/10. She is not experiencing anymore nausea.   She has had 4 prior cesarean sections. She denies fevers, chills, chest pain, dyspnea, diarrhea, and constipation.  ROS: All systems reviewed and otherwise negative except for as above.  Family History  Problem Relation Age of Onset  . Asthma Father   . Asthma Brother     Past Medical History:  Diagnosis Date  . Anemia   . Medical history non-contributory     Past Surgical History:  Procedure Laterality Date  . CESAREAN SECTION    . CESAREAN SECTION N/A 11/23/2012   Procedure: CESAREAN SECTION;  Surgeon: Lauren Hamman, MD;  Location: [facility];  Service: Obstetrics;  Laterality: N/A;  Repeat Cesarean Section Delivery Baby    @      , Apgars   . CESAREAN SECTION N/A 02/15/2016   Procedure: CESAREAN SECTION;  Surgeon: Lauren Bern, MD;  Location: [place];  Service: Obstetrics;  Laterality: N/A;  . DILATION AND EVACUATION  N/A 04/30/2014   Procedure: DILATATION AND EVACUATION;  Surgeon: Lauren Hamman, MD;  Location: [facility];  Service: Gynecology;  Laterality: N/A;    Social History:  reports that she has quit smoking. She smoked 0.25 packs per day. She has never used smokeless tobacco. She reports that she does not drink alcohol or use drugs.  Allergies:  Allergies  Allergen Reactions  . Latex Itching     (Not in a hospital admission)  Blood pressure 125/89, pulse (!) 54, temperature 98.1 F (36.7 C), temperature source Oral, resp. rate 18, height _0  (1.626 m), weight 83.5 kg (184 lb), last menstrual period 11/23/2016, SpO2 99 %, not currently breastfeeding. Physical Exam: General: pleasant, cooperative female, she appears in mild discomfort in the ED bed. HEENT: PERRL, buccal mucosa is moist and pink,  Heart: RRR with no murmurs, gallops or rubs noted. Radial pulses 2+ and regular Lungs: CTA bilaterally; No accessory muscle use Abd: Tenderness to palpation in the RUQ and epigastric regions. No Murphy sign noted. Soft and nondistended. No masses noted. MS: No edema. Skin: warm and dry Psych: A&Ox3 with an appropriate affect.   Results for orders placed or performed during the hospital encounter of 11/30/16 (from the past 48 hour(s))  Comprehensive metabolic panel     Status: Abnormal   Collection Time: 11/30/16 11:34 AM  Result Value Ref Range   Sodium 137 135 - 145 mmol/L   Potassium  4.2 3.5 - 5.1 mmol/L   Chloride 104 101 - 111 mmol/L   CO2 26 22 - 32 mmol/L   Glucose, Bld 97 65 - 99 mg/dL   BUN 7 6 - 20 mg/dL   Creatinine, Ser 0.62 0.44 - 1.00 mg/dL   Calcium 9.3 8.9 - 10.3 mg/dL   Total Protein 7.0 6.5 - 8.1 g/dL   Albumin 4.0 3.5 - 5.0 g/dL   AST 18 15 - 41 U/L   ALT 13 (L) 14 - 54 U/L   Alkaline Phosphatase 62 38 - 126 U/L   Total Bilirubin 0.5 0.3 - 1.2 mg/dL   GFR calc non Af Amer >60 >60 mL/min   GFR calc Af Amer >60 >60 mL/min    Comment: (NOTE) The eGFR has been  calculated using the CKD EPI equation. This calculation has not been validated in all clinical situations. eGFR's persistently <60 mL/min signify possible Chronic Kidney Disease.    Anion gap 7 5 - 15  CBC with Differential/Platelet     Status: None   Collection Time: 11/30/16 11:34 AM  Result Value Ref Range   WBC 7.3 4.0 - 10.5 K/uL   RBC 4.36 3.87 - 5.11 MIL/uL   Hemoglobin 12.6 12.0 - 15.0 g/dL   HCT 39.6 36.0 - 46.0 %   MCV 90.8 78.0 - 100.0 fL   MCH 28.9 26.0 - 34.0 pg   MCHC 31.8 30.0 - 36.0 g/dL   RDW 13.8 11.5 - 15.5 %   Platelets 345 150 - 400 K/uL   Neutrophils Relative % 47 %   Neutro Abs 3.5 1.7 - 7.7 K/uL   Lymphocytes Relative 41 %   Lymphs Abs 3.0 0.7 - 4.0 K/uL   Monocytes Relative 5 %   Monocytes Absolute 0.3 0.1 - 1.0 K/uL   Eosinophils Relative 7 %   Eosinophils Absolute 0.5 0.0 - 0.7 K/uL   Basophils Relative 0 %   Basophils Absolute 0.0 0.0 - 0.1 K/uL  Lipase, blood     Status: None   Collection Time: 11/30/16 11:34 AM  Result Value Ref Range   Lipase 16 11 - 51 U/L  Pregnancy, urine     Status: None   Collection Time: 11/30/16 11:42 AM  Result Value Ref Range   Preg Test, Ur NEGATIVE NEGATIVE    Comment:        THE SENSITIVITY OF THIS METHODOLOGY IS >20 mIU/mL.   Urinalysis, Routine w reflex microscopic     Status: Abnormal   Collection Time: 11/30/16 11:52 AM  Result Value Ref Range   Color, Urine AMBER (A) YELLOW    Comment: BIOCHEMICALS MAY BE AFFECTED BY COLOR   APPearance HAZY (A) CLEAR   Specific Gravity, Urine 1.020 1.005 - 1.030   pH 8.5 (H) 5.0 - 8.0   Glucose, UA NEGATIVE NEGATIVE mg/dL   Hgb urine dipstick LARGE (A) NEGATIVE   Bilirubin Urine NEGATIVE NEGATIVE   Ketones, ur NEGATIVE NEGATIVE mg/dL   Protein, ur 100 (A) NEGATIVE mg/dL   Nitrite NEGATIVE NEGATIVE   Leukocytes, UA TRACE (A) NEGATIVE  Urinalysis, Microscopic (reflex)     Status: Abnormal   Collection Time: 11/30/16 11:52 AM  Result Value Ref Range   RBC / HPF 0-5  0 - 5 RBC/hpf   WBC, UA 0-5 0 - 5 WBC/hpf   Bacteria, UA FEW (A) NONE SEEN   Squamous Epithelial / LPF 6-30 (A) NONE SEEN   No results found.    Assessment/Plan  1.  Biliary Colic w/ multiple gallstones - Patient will be discharged from ED due to childcare issues; An elective lap chole will be scheduled for next week. - RUQ U/S (11/19/16) noted multiple gallstones, but no evidence of cholecystitis at this time. - Advised to avoid fatty/greasy foods;  - Intermittent RUQ and epigastric pain;  Will be discharged with pain management regimen   Roselle Locus, PA-S  She clearly has symptomatic cholelithiasis. I recommended admission and lap chole especially with WES sign.  Due to childcare issues she has declined this and wants to do next week.  I think this is likely ok and I have scheduled her with my partner Dr Kieth Brightly for Monday.  I told her if she has worse symptoms then she needs to return. I discussed the procedure in detail.  We discussed the risks and benefits of a laparoscopic cholecystectomy and possible cholangiogram including, but not limited to bleeding, infection, injury to surrounding structures such as the intestine or liver, bile leak, retained gallstones, need to convert to an open procedure, prolonged diarrhea, blood clots such as  DVT, common bile duct injury, anesthesia risks, and possible need for additional procedures.  The likelihood of improvement in symptoms and return to the patient's normal status is good. We discussed the typical post-operative recovery course.

## 2016-12-03 ENCOUNTER — Ambulatory Visit (HOSPITAL_COMMUNITY): Payer: Self-pay | Admitting: *Deleted

## 2016-12-03 ENCOUNTER — Encounter (HOSPITAL_COMMUNITY): Payer: Self-pay | Admitting: Surgery

## 2016-12-03 ENCOUNTER — Encounter (HOSPITAL_COMMUNITY): Admission: RE | Disposition: A | Discharge status: Home / Self Care | Payer: Self-pay | Source: Ambulatory Visit

## 2016-12-03 ENCOUNTER — Inpatient Hospital Stay (HOSPITAL_COMMUNITY)
Admission: RE | Admit: 2016-12-03 | Discharge: 2016-12-06 | DRG: 419 | Disposition: A | Discharge status: Home / Self Care | Payer: Self-pay | Source: Ambulatory Visit | Attending: General Surgery | Admitting: General Surgery

## 2016-12-03 DIAGNOSIS — Z91040 Latex allergy status: Secondary | ICD-10-CM

## 2016-12-03 DIAGNOSIS — K811 Chronic cholecystitis: Secondary | ICD-10-CM | POA: Diagnosis present

## 2016-12-03 DIAGNOSIS — K8010 Calculus of gallbladder with chronic cholecystitis without obstruction: Principal | ICD-10-CM | POA: Diagnosis present

## 2016-12-03 DIAGNOSIS — R410 Disorientation, unspecified: Secondary | ICD-10-CM | POA: Diagnosis not present

## 2016-12-03 DIAGNOSIS — Z87891 Personal history of nicotine dependence: Secondary | ICD-10-CM

## 2016-12-03 DIAGNOSIS — Z79899 Other long term (current) drug therapy: Secondary | ICD-10-CM

## 2016-12-03 DIAGNOSIS — D649 Anemia, unspecified: Secondary | ICD-10-CM | POA: Diagnosis present

## 2016-12-03 DIAGNOSIS — K819 Cholecystitis, unspecified: Secondary | ICD-10-CM | POA: Diagnosis present

## 2016-12-03 HISTORY — PX: CHOLECYSTECTOMY: SHX55

## 2016-12-03 HISTORY — DX: Cholecystitis, unspecified: K81.9

## 2016-12-03 LAB — CBC
HCT: 39.6 % (ref 36.0–46.0)
Hemoglobin: 12.6 g/dL (ref 12.0–15.0)
MCH: 29.2 pg (ref 26.0–34.0)
MCHC: 31.8 g/dL (ref 30.0–36.0)
MCV: 91.9 fL (ref 78.0–100.0)
PLATELETS: 314 10*3/uL (ref 150–400)
RBC: 4.31 MIL/uL (ref 3.87–5.11)
RDW: 13.8 % (ref 11.5–15.5)
WBC: 7.3 10*3/uL (ref 4.0–10.5)

## 2016-12-03 LAB — HCG, SERUM, QUALITATIVE: Preg, Serum: NEGATIVE

## 2016-12-03 SURGERY — LAPAROSCOPIC CHOLECYSTECTOMY WITH INTRAOPERATIVE CHOLANGIOGRAM
Anesthesia: General | Site: Abdomen

## 2016-12-03 MED ORDER — LACTATED RINGERS IV SOLN
INTRAVENOUS | Status: DC
  Administered 2016-12-03 – 2016-12-04 (×4): via INTRAVENOUS

## 2016-12-03 MED ORDER — CHLORHEXIDINE GLUCONATE CLOTH 2 % EX PADS: 6.0000 | MEDICATED_PAD | Freq: Once | CUTANEOUS | Status: DC

## 2016-12-03 MED ORDER — METHOCARBAMOL 1000 MG/10ML IJ SOLN
500.0000 mg | Freq: Once | INTRAVENOUS | Status: AC
  Administered 2016-12-03: 500 mg via INTRAVENOUS
  Filled 2016-12-03: qty 5

## 2016-12-03 MED ORDER — MORPHINE SULFATE (PF) 4 MG/ML IV SOLN: 4.0000 mg | INTRAVENOUS | Status: DC | PRN

## 2016-12-03 MED ORDER — SUGAMMADEX SODIUM 200 MG/2ML IV SOLN
INTRAVENOUS | Status: DC | PRN
  Administered 2016-12-03: 400 mg via INTRAVENOUS

## 2016-12-03 MED ORDER — ONDANSETRON HCL 4 MG/2ML IJ SOLN: 4.0000 mg | Freq: Once | INTRAMUSCULAR | Status: DC | PRN

## 2016-12-03 MED ORDER — DEXAMETHASONE SODIUM PHOSPHATE 10 MG/ML IJ SOLN
INTRAMUSCULAR | Status: AC
  Filled 2016-12-03: qty 1

## 2016-12-03 MED ORDER — LIDOCAINE 2% (20 MG/ML) 5 ML SYRINGE
INTRAMUSCULAR | Status: AC
  Filled 2016-12-03: qty 5

## 2016-12-03 MED ORDER — PHENYLEPHRINE HCL 10 MG/ML IJ SOLN
INTRAMUSCULAR | Status: DC | PRN
  Administered 2016-12-03: 80 ug via INTRAVENOUS

## 2016-12-03 MED ORDER — ROCURONIUM BROMIDE 100 MG/10ML IV SOLN
INTRAVENOUS | Status: DC | PRN
  Administered 2016-12-03: 50 mg via INTRAVENOUS

## 2016-12-03 MED ORDER — SODIUM CHLORIDE 0.9 % IR SOLN
Status: DC | PRN
  Administered 2016-12-03: 1000 mL

## 2016-12-03 MED ORDER — CEFAZOLIN SODIUM-DEXTROSE 2-4 GM/100ML-% IV SOLN
2.0000 g | INTRAVENOUS | Status: AC
  Administered 2016-12-03: 2 g via INTRAVENOUS
  Filled 2016-12-03: qty 100

## 2016-12-03 MED ORDER — IBUPROFEN 800 MG PO TABS: 800.0000 mg | ORAL_TABLET | Freq: Three times a day (TID) | ORAL | 0 refills | Status: AC | PRN

## 2016-12-03 MED ORDER — MIDAZOLAM HCL 2 MG/2ML IJ SOLN
INTRAMUSCULAR | Status: AC
  Filled 2016-12-03: qty 2

## 2016-12-03 MED ORDER — FENTANYL CITRATE (PF) 100 MCG/2ML IJ SOLN
INTRAMUSCULAR | Status: AC
  Filled 2016-12-03: qty 2

## 2016-12-03 MED ORDER — FENTANYL CITRATE (PF) 100 MCG/2ML IJ SOLN
INTRAMUSCULAR | Status: DC | PRN
  Administered 2016-12-03 (×4): 100 ug via INTRAVENOUS

## 2016-12-03 MED ORDER — ROCURONIUM BROMIDE 50 MG/5ML IV SOSY
PREFILLED_SYRINGE | INTRAVENOUS | Status: AC
  Filled 2016-12-03: qty 5

## 2016-12-03 MED ORDER — BUPIVACAINE HCL (PF) 0.25 % IJ SOLN
INTRAMUSCULAR | Status: AC
  Filled 2016-12-03: qty 30

## 2016-12-03 MED ORDER — FENTANYL CITRATE (PF) 100 MCG/2ML IJ SOLN
INTRAMUSCULAR | Status: AC
  Filled 2016-12-03: qty 4

## 2016-12-03 MED ORDER — PHENYLEPHRINE 40 MCG/ML (10ML) SYRINGE FOR IV PUSH (FOR BLOOD PRESSURE SUPPORT)
PREFILLED_SYRINGE | INTRAVENOUS | Status: AC
  Filled 2016-12-03: qty 10

## 2016-12-03 MED ORDER — SIMETHICONE 80 MG PO CHEW
40.0000 mg | CHEWABLE_TABLET | Freq: Four times a day (QID) | ORAL | Status: DC | PRN
  Administered 2016-12-05: 40 mg via ORAL
  Filled 2016-12-03: qty 1

## 2016-12-03 MED ORDER — ONDANSETRON HCL 4 MG/2ML IJ SOLN
INTRAMUSCULAR | Status: DC | PRN
  Administered 2016-12-03: 4 mg via INTRAVENOUS

## 2016-12-03 MED ORDER — DIPHENHYDRAMINE HCL 50 MG/ML IJ SOLN: 25.0000 mg | Freq: Four times a day (QID) | INTRAMUSCULAR | Status: DC | PRN

## 2016-12-03 MED ORDER — 0.9 % SODIUM CHLORIDE (POUR BTL) OPTIME
TOPICAL | Status: DC | PRN
  Administered 2016-12-03: 1000 mL

## 2016-12-03 MED ORDER — FENTANYL CITRATE (PF) 100 MCG/2ML IJ SOLN
25.0000 ug | INTRAMUSCULAR | Status: DC | PRN
  Administered 2016-12-03 (×4): 25 ug via INTRAVENOUS

## 2016-12-03 MED ORDER — PROPOFOL 10 MG/ML IV BOLUS
INTRAVENOUS | Status: DC | PRN
  Administered 2016-12-03: 150 mg via INTRAVENOUS

## 2016-12-03 MED ORDER — GLYCOPYRROLATE 0.2 MG/ML IJ SOLN
INTRAMUSCULAR | Status: DC | PRN
  Administered 2016-12-03: 0.2 mg via INTRAVENOUS

## 2016-12-03 MED ORDER — KETOROLAC TROMETHAMINE 30 MG/ML IJ SOLN
INTRAMUSCULAR | Status: AC
  Filled 2016-12-03: qty 1

## 2016-12-03 MED ORDER — HYDROCODONE-ACETAMINOPHEN 5-325 MG PO TABS: 1.0000 | ORAL_TABLET | Freq: Four times a day (QID) | ORAL | 0 refills | Status: DC | PRN

## 2016-12-03 MED ORDER — ACETAMINOPHEN 325 MG PO TABS: 650.0000 mg | ORAL_TABLET | Freq: Four times a day (QID) | ORAL | Status: DC | PRN

## 2016-12-03 MED ORDER — KETOROLAC TROMETHAMINE 30 MG/ML IJ SOLN
INTRAMUSCULAR | Status: DC | PRN
  Administered 2016-12-03: 30 mg via INTRAVENOUS

## 2016-12-03 MED ORDER — ONDANSETRON HCL 4 MG/2ML IJ SOLN
INTRAMUSCULAR | Status: AC
  Filled 2016-12-03: qty 2

## 2016-12-03 MED ORDER — KETOROLAC TROMETHAMINE 30 MG/ML IJ SOLN
30.0000 mg | Freq: Four times a day (QID) | INTRAMUSCULAR | Status: DC
  Administered 2016-12-03 – 2016-12-06 (×12): 30 mg via INTRAVENOUS
  Filled 2016-12-03 (×12): qty 1

## 2016-12-03 MED ORDER — LIDOCAINE HCL (CARDIAC) 20 MG/ML IV SOLN
INTRAVENOUS | Status: DC | PRN
  Administered 2016-12-03: 80 mg via INTRAVENOUS

## 2016-12-03 MED ORDER — PROPOFOL 10 MG/ML IV BOLUS
INTRAVENOUS | Status: AC
  Filled 2016-12-03: qty 20

## 2016-12-03 MED ORDER — ACETAMINOPHEN 650 MG RE SUPP: 650.0000 mg | Freq: Four times a day (QID) | RECTAL | Status: DC | PRN

## 2016-12-03 MED ORDER — KETOROLAC TROMETHAMINE 30 MG/ML IJ SOLN: 30.0000 mg | Freq: Once | INTRAMUSCULAR | Status: DC

## 2016-12-03 MED ORDER — MEPERIDINE HCL 25 MG/ML IJ SOLN: 6.2500 mg | INTRAMUSCULAR | Status: DC | PRN

## 2016-12-03 MED ORDER — OXYCODONE HCL 5 MG PO TABS
5.0000 mg | ORAL_TABLET | ORAL | Status: DC | PRN
  Administered 2016-12-03: 5 mg via ORAL
  Administered 2016-12-03 – 2016-12-06 (×12): 10 mg via ORAL
  Filled 2016-12-03 (×13): qty 2

## 2016-12-03 MED ORDER — IOPAMIDOL (ISOVUE-300) INJECTION 61%
INTRAVENOUS | Status: AC
  Filled 2016-12-03: qty 50

## 2016-12-03 MED ORDER — ONDANSETRON 4 MG PO TBDP: 4.0000 mg | ORAL_TABLET | Freq: Four times a day (QID) | ORAL | Status: DC | PRN

## 2016-12-03 MED ORDER — SUGAMMADEX SODIUM 500 MG/5ML IV SOLN
INTRAVENOUS | Status: AC
  Filled 2016-12-03: qty 5

## 2016-12-03 MED ORDER — DEXAMETHASONE SODIUM PHOSPHATE 4 MG/ML IJ SOLN
INTRAMUSCULAR | Status: DC | PRN
  Administered 2016-12-03: 10 mg via INTRAVENOUS

## 2016-12-03 MED ORDER — MORPHINE SULFATE (PF) 2 MG/ML IV SOLN
2.0000 mg | INTRAVENOUS | Status: DC | PRN
  Administered 2016-12-03 (×4): 2 mg via INTRAVENOUS
  Filled 2016-12-03 (×4): qty 1

## 2016-12-03 MED ORDER — MIDAZOLAM HCL 5 MG/5ML IJ SOLN
INTRAMUSCULAR | Status: DC | PRN
  Administered 2016-12-03: 2 mg via INTRAVENOUS

## 2016-12-03 MED ORDER — DIPHENHYDRAMINE HCL 25 MG PO CAPS: 25.0000 mg | ORAL_CAPSULE | Freq: Four times a day (QID) | ORAL | Status: DC | PRN

## 2016-12-03 MED ORDER — ONDANSETRON HCL 4 MG/2ML IJ SOLN
4.0000 mg | Freq: Four times a day (QID) | INTRAMUSCULAR | Status: DC | PRN
  Administered 2016-12-03 – 2016-12-06 (×7): 4 mg via INTRAVENOUS
  Filled 2016-12-03 (×7): qty 2

## 2016-12-03 MED ORDER — HYDROMORPHONE HCL 2 MG/ML IJ SOLN
1.0000 mg | INTRAMUSCULAR | Status: DC | PRN
  Administered 2016-12-03 – 2016-12-04 (×4): 1 mg via INTRAVENOUS
  Filled 2016-12-03 (×4): qty 1

## 2016-12-03 MED ORDER — BUPIVACAINE HCL 0.25 % IJ SOLN
INTRAMUSCULAR | Status: DC | PRN
  Administered 2016-12-03: 30 mL

## 2016-12-03 SURGICAL SUPPLY — 47 items
ADH SKN CLS APL DERMABOND .7 (GAUZE/BANDAGES/DRESSINGS) ×1
APPLIER CLIP ROT 10 11.4 M/L (STAPLE)
APR CLP MED LRG 11.4X10 (STAPLE)
BAG SPEC RTRVL 10 TROC 200 (ENDOMECHANICALS) ×1
BLADE CLIPPER SURG (BLADE) IMPLANT
CANISTER SUCT 3000ML PPV (MISCELLANEOUS) ×3 IMPLANT
CATH CHOLANG 76X19 KUMAR (CATHETERS) ×1 IMPLANT
CHLORAPREP W/TINT 26ML (MISCELLANEOUS) ×3 IMPLANT
CLIP APPLIE ROT 10 11.4 M/L (STAPLE) IMPLANT
CLIP LIGATING HEMO LOK XL GOLD (MISCELLANEOUS) ×3 IMPLANT
CLIP LIGATING HEMO O LOK GREEN (MISCELLANEOUS) IMPLANT
COVER MAYO STAND STRL (DRAPES) ×1 IMPLANT
COVER SURGICAL LIGHT HANDLE (MISCELLANEOUS) ×3 IMPLANT
DERMABOND ADVANCED (GAUZE/BANDAGES/DRESSINGS) ×2
DERMABOND ADVANCED .7 DNX12 (GAUZE/BANDAGES/DRESSINGS) ×1 IMPLANT
DRAPE C-ARM 42X72 X-RAY (DRAPES) ×1 IMPLANT
ELECT REM PT RETURN 9FT ADLT (ELECTROSURGICAL) ×3
ELECTRODE REM PT RTRN 9FT ADLT (ELECTROSURGICAL) ×1 IMPLANT
GLOVE BIOGEL PI IND STRL 6.5 (GLOVE) IMPLANT
GLOVE BIOGEL PI IND STRL 7.0 (GLOVE) ×1 IMPLANT
GLOVE BIOGEL PI INDICATOR 6.5 (GLOVE) ×2
GLOVE BIOGEL PI INDICATOR 7.0 (GLOVE) ×2
GLOVE INDICATOR 7.5 STRL GRN (GLOVE) ×2 IMPLANT
GLOVE SURG SS PI 6.0 STRL IVOR (GLOVE) ×2 IMPLANT
GLOVE SURG SS PI 7.0 STRL IVOR (GLOVE) ×3 IMPLANT
GLOVE SURG SS PI 7.5 STRL IVOR (GLOVE) ×2 IMPLANT
GOWN STRL REUS W/ TWL LRG LVL3 (GOWN DISPOSABLE) ×3 IMPLANT
GOWN STRL REUS W/TWL LRG LVL3 (GOWN DISPOSABLE) ×9
GRASPER SUT TROCAR 14GX15 (MISCELLANEOUS) ×3 IMPLANT
KIT BASIN OR (CUSTOM PROCEDURE TRAY) ×3 IMPLANT
KIT ROOM TURNOVER OR (KITS) ×3 IMPLANT
NS IRRIG 1000ML POUR BTL (IV SOLUTION) ×3 IMPLANT
PAD ARMBOARD 7.5X6 YLW CONV (MISCELLANEOUS) ×3 IMPLANT
POUCH RETRIEVAL ECOSAC 10 (ENDOMECHANICALS) ×1 IMPLANT
POUCH RETRIEVAL ECOSAC 10MM (ENDOMECHANICALS) ×2
SCISSORS LAP 5X35 DISP (ENDOMECHANICALS) ×3 IMPLANT
SET IRRIG TUBING LAPAROSCOPIC (IRRIGATION / IRRIGATOR) ×3 IMPLANT
SLEEVE ENDOPATH XCEL 5M (ENDOMECHANICALS) ×6 IMPLANT
SPECIMEN JAR SMALL (MISCELLANEOUS) ×3 IMPLANT
STOPCOCK 4 WAY LG BORE MALE ST (IV SETS) ×1 IMPLANT
SUT MNCRL AB 4-0 PS2 18 (SUTURE) ×3 IMPLANT
TOWEL OR 17X24 6PK STRL BLUE (TOWEL DISPOSABLE) ×3 IMPLANT
TOWEL OR 17X26 10 PK STRL BLUE (TOWEL DISPOSABLE) ×1 IMPLANT
TRAY LAPAROSCOPIC MC (CUSTOM PROCEDURE TRAY) ×3 IMPLANT
TROCAR XCEL 12X100 BLDLESS (ENDOMECHANICALS) ×3 IMPLANT
TROCAR XCEL NON-BLD 5MMX100MML (ENDOMECHANICALS) ×3 IMPLANT
TUBING INSUFFLATION (TUBING) ×3 IMPLANT

## 2016-12-03 NOTE — Anesthesia Preprocedure Evaluation (Signed)
Anesthesia Evaluation    Reviewed: Allergy & Precautions, H&P , NPO status , Patient's Chart, lab work & pertinent test results  Airway Mallampati: II  TM Distance: >3 FB Neck ROM: full    Dental no notable dental hx. (+) Teeth Intact   Pulmonary former smoker,    Pulmonary exam normal        Cardiovascular negative cardio ROS Normal cardiovascular exam     Neuro/Psych negative psych ROS   GI/Hepatic negative GI ROS, Neg liver ROS,   Endo/Other    Renal/GU negative Renal ROS  negative genitourinary   Musculoskeletal negative musculoskeletal ROS (+)   Abdominal Normal abdominal exam  (+)   Peds  Hematology   Anesthesia Other Findings   Reproductive/Obstetrics negative OB ROS                             Anesthesia Physical Anesthesia Plan  ASA: II  Anesthesia Plan: General   Post-op Pain Management:    Induction: Intravenous  Airway Management Planned: Oral ETT  Additional Equipment:   Intra-op Plan:   Post-operative Plan: Extubation in OR  Informed Consent: I have reviewed the patients History and Physical, chart, labs and discussed the procedure including the risks, benefits and alternatives for the proposed anesthesia with the patient or authorized representative who has indicated his/her understanding and acceptance.   Dental Advisory Given  Plan Discussed with: CRNA and Surgeon  Anesthesia Plan Comments:         Anesthesia Quick Evaluation

## 2016-12-03 NOTE — Progress Notes (Signed)
Patient complaining of terrible pain and doesn't feel ready to go home. Earlier nurse reported patient did not know her name or where she was. This acute delirium appears to have spontaneously resolved. BP (!) 130/102 Comment: trying to sit herself up while taking   Pulse 70   Temp 97.3 F (36.3 C)   Resp 17   LMP 11/23/2016 (Exact Date)   SpO2 97%  Chronic cholecystitis -obs patient due to terrible pains

## 2016-12-03 NOTE — H&P (Signed)
Lauren Pineda is an 31 y.o. female.   Chief Complaint: abdominal pain HPI: 31 yo female with multiple bouts of epigastric abdominal pain. She was in the ER 3 days ago with findings of chronic cholecystitis and sent home with the plan to undergo surgery today. She continues to have intermittent abdominal pain for which she is taking pain medications.  Past Medical History:  Diagnosis Date  . Anemia   . Medical history non-contributory     Past Surgical History:  Procedure Laterality Date  . CESAREAN SECTION    . CESAREAN SECTION N/A 11/23/2012   Procedure: CESAREAN SECTION;  Surgeon: Kathreen Cosier, MD;  Location: [facility];  Service: Obstetrics;  Laterality: N/A;  Repeat Cesarean Section Delivery Baby    @      , Apgars   . CESAREAN SECTION N/A 02/15/2016   Procedure: CESAREAN SECTION;  Surgeon: Silverio Lay, MD;  Location: [facility];  Service: Obstetrics;  Laterality: N/A;  . DILATION AND EVACUATION N/A 04/30/2014   Procedure: DILATATION AND EVACUATION;  Surgeon: Kathreen Cosier, MD;  Location: [facility];  Service: Gynecology;  Laterality: N/A;    Family History  Problem Relation Age of Onset  . Asthma Father   . Asthma Brother    Social History:  reports that she has quit smoking. She smoked 0.25 packs per day. She has never used smokeless tobacco. She reports that she does not drink alcohol or use drugs.  Allergies:  Allergies  Allergen Reactions  . Latex Itching    Medications Prior to Admission  Medication Sig Dispense Refill  . HYDROcodone-acetaminophen (NORCO/VICODIN) 5-325 MG tablet Take 1-2 tablets by mouth every 6 (six) hours as needed for moderate pain. 14 tablet 0  . ibuprofen (ADVIL,MOTRIN) 800 MG tablet Take 1 tablet (800 mg total) by mouth every 6 (six) hours as needed. (Patient not taking: Reported on 11/19/2016) 30 tablet 2  . ondansetron (ZOFRAN) 4 MG tablet Take 1 tablet (4 mg total) by mouth every 8 (eight) hours as needed for nausea or vomiting.  (Patient not taking: Reported on 11/30/2016) 12 tablet 0  . oxyCODONE-acetaminophen (PERCOCET/ROXICET) 5-325 MG tablet Take 1-2 tablets by mouth every 4 (four) hours as needed for severe pain. (Patient not taking: Reported on 11/30/2016) 15 tablet 0  . oxyCODONE-acetaminophen (ROXICET) 5-325 MG tablet Take 1 tablet by mouth every 4 (four) hours as needed for severe pain. (Patient not taking: Reported on 11/19/2016) 15 tablet 0    Results for orders placed or performed during the hospital encounter of 12/03/16 (from the past 48 hour(s))  hCG, serum, qualitative     Status: None   Collection Time: 12/03/16  7:57 AM  Result Value Ref Range   Preg, Serum NEGATIVE NEGATIVE    Comment:        THE SENSITIVITY OF THIS METHODOLOGY IS >10 mIU/mL.   CBC     Status: None   Collection Time: 12/03/16  7:57 AM  Result Value Ref Range   WBC 7.3 4.0 - 10.5 K/uL   RBC 4.31 3.87 - 5.11 MIL/uL   Hemoglobin 12.6 12.0 - 15.0 g/dL   HCT 46.9 62.9 - 52.8 %   MCV 91.9 78.0 - 100.0 fL   MCH 29.2 26.0 - 34.0 pg   MCHC 31.8 30.0 - 36.0 g/dL   RDW 41.3 24.4 - 01.0 %   Platelets 314 150 - 400 K/uL   No results found.  Review of Systems  Constitutional: Negative for chills and  fever.  HENT: Negative for hearing loss.   Eyes: Negative for blurred vision and double vision.  Respiratory: Negative for cough and hemoptysis.   Cardiovascular: Negative for chest pain and palpitations.  Gastrointestinal: Positive for abdominal pain and nausea. Negative for vomiting.  Genitourinary: Negative for dysuria and urgency.  Musculoskeletal: Negative for myalgias and neck pain.  Skin: Negative for itching and rash.  Neurological: Negative for dizziness, tingling and headaches.  Endo/Heme/Allergies: Does not bruise/bleed easily.  Psychiatric/Behavioral: Negative for depression and suicidal ideas.    Blood pressure 120/78, pulse 75, temperature 98.5 F (36.9 C), temperature source Oral, resp. rate 20, last menstrual period  11/23/2016, SpO2 100 %, not currently breastfeeding. Physical Exam  Vitals reviewed. Constitutional: She is oriented to person, place, and time. She appears well-developed and well-nourished.  HENT:  Head: Normocephalic and atraumatic.  Eyes: Conjunctivae and EOM are normal. Pupils are equal, round, and reactive to light.  Neck: Normal range of motion. Neck supple.  Cardiovascular: Normal rate and regular rhythm.   Respiratory: Effort normal and breath sounds normal.  GI: Soft. Bowel sounds are normal. She exhibits no distension. There is no tenderness.  Epigastric tenderness  Musculoskeletal: Normal range of motion.  Neurological: She is alert and oriented to person, place, and time.  Skin: Skin is warm and dry.  Psychiatric: She has a normal mood and affect. Her behavior is normal.     Assessment/Plan 31 yo female with chronic calculous cholecystitis -We discussed the etiology of her pain, we discussed treatment options and recommended surgery. We discussed details of surgery including general anesthesia, laparoscopic approach, identification of cystic duct and common bile duct. Ligation of cystic duct and cystic artery. Possible need for intraoperative cholangiogram or open procedure. Possible risks of common bile duct injury, liver injury, cystic duct leak, bleeding, infection, post-cholecystectomy syndrome. The patient showed good understanding and all questions were answered -lap chole today  Rodman PickleLuke Aaron Kimberlly Norgard, MD 12/03/2016, 9:07 AM

## 2016-12-03 NOTE — Progress Notes (Signed)
Patient stated MD Kinsinger never came and saw her, all he did was change the morphine from 2mg  q1h to 4mg  q2h. Patient requesting to see a MD. I paged on call MD Dwain SarnaWakefield and notified him of the situation. He said he would order Dilaudid and some Robaxin and she could keep the oxycodone but that no one could come see her until tomorrow unless it was emergency.

## 2016-12-03 NOTE — Op Note (Signed)
PATIENT:  Lauren Pineda  31 y.o. female  PRE-OPERATIVE DIAGNOSIS:  gallstones  POST-OPERATIVE DIAGNOSIS:  gallstones  PROCEDURE:  Procedure(s): LAPAROSCOPIC CHOLECYSTECTOMY   SURGEON:  Surgeon(s): De BlanchLuke Aaron Drexler Maland, MD  ASSISTANT: RNFA  ANESTHESIA:   local and general  Indications for procedure: Lauren CancelFerrice J Siers is a 31 y.o. female with symptoms of Abdominal pain and Nausea and vomiting consistent with gallbladder disease, Confirmed by Ultrasound.  Description of procedure: The patient was brought into the operative suite, placed supine. Anesthesia was administered with endotracheal tube. Patient was strapped in place and foot board was secured. All pressure points were offloaded by foam padding. The patient was prepped and draped in the usual sterile fashion.  A small incision was made to the right of the umbilicus. A 5mm trocar was inserted into the peritoneal cavity with optical entry. Pneumoperitoneum was applied with high flow low pressure. 2 5mm trocars were placed in the RUQ. A 12mm trocar was placed in the subxiphoid space. All trocars sites were first anesthesized with 0.25% marcaine with epinephrine in the subcutaneous and preperitoneal layers. Next the patient was placed in reverse trendelenberg. The gallbladder was white with multiple bulged areas consistent with medium sized stones  The gallbladder was retracted cephalad and lateral. The peritoneum was reflected off the infundibulum working lateral to medial. The cystic duct and cystic artery were identified and further dissection revealed a critical view. The cystic duct and cystic artery were doubly clipped and ligated.   The gallbladder was removed off the liver bed with cautery. The Gallbladder was placed in a specimen bag. The gallbladder fossa was irrigated and hemostasis was applied with cautery. The gallbladder was removed via the 12mm trocar. The fascial defect was closed with interrupted 0 vicryl suture via  laparoscopic trans-fascial suture passer. Pneumoperitoneum was removed, all trocar were removed. All incisions were closed with 4-0 monocryl subcuticular stitch. The patient woke from anesthesia and was brought to PACU in stable condition. All counts were correct  Findings: inflamed gallbladder with stones  Specimen: gallbladder  Blood loss: No intake/output data recorded. ml  Local anesthesia: 16 ml 0.5% marcaine  Complications: none  PLAN OF CARE: Discharge to home after PACU  PATIENT DISPOSITION:  PACU - hemodynamically stable.  Feliciana RossettiLuke Jeovanni Heuring, M.D. General, Bariatric, & Minimally Invasive Surgery [facility]

## 2016-12-03 NOTE — Progress Notes (Addendum)
Notified MD patient was unhappy with pain medicine and requesting to talk to MD or MD's boss. I notified MD Kinsinger that I gave her pain medicine every hour she has been here from PACU and she says nothing is helping her. He said he would go she her.

## 2016-12-03 NOTE — Transfer of Care (Signed)
Immediate Anesthesia Transfer of Care Note  Patient: Lauren Pineda  Procedure(s) Performed: Procedure(s): LAPAROSCOPIC CHOLECYSTECTOMY (N/A)  Patient Location: PACU  Anesthesia Type:General  Level of Consciousness: awake, alert  and oriented  Airway & Oxygen Therapy: Patient Spontanous Breathing and Patient connected to nasal cannula oxygen  Post-op Assessment: Report given to RN and Post -op Vital signs reviewed and stable  Post vital signs: Reviewed and stable  Last Vitals:  Vitals:   12/03/16 0758 12/03/16 1103  BP: 120/78   Pulse: 75   Resp: 20   Temp: 36.9 C 36.3 C    Last Pain:  Vitals:   12/03/16 1103  TempSrc:   PainSc: 0-No pain      Patients Stated Pain Goal: 3 (12/03/16 16100814)  Complications: No apparent anesthesia complications

## 2016-12-03 NOTE — Progress Notes (Signed)
1129: Patient was asking for more pain medication, when asked her name and birthday she was unable to remember.  She was trying to think of the answer but could not voice an answer.  When asked to squeeze my hand it was very weak.  When asked to move feet she was unable to.  1135: Dr. Sheliah HatchKinsinger notified in OR will come to bedside once left OR.  1138: Dr. Arby BarretteHatchett at bedside evaluating patient.  1148: Dr. Arby BarretteHatchett will continue to monitor no emergent actions taken at this time. Patient is able to state her first name.  She is able to bend her elbows and knees with assistance. Philip RN (primary RN) at bedside will continue to monitor.   1153: patient has purposeful movement of right arm to itch her nose.   Report off to Yahoo! IncPhilip RN

## 2016-12-03 NOTE — Anesthesia Procedure Notes (Signed)
Procedure Name: Intubation Date/Time: 12/03/2016 9:56 AM Performed by: Reine JustFLOWERS, Koty Anctil T Pre-anesthesia Checklist: Patient identified, Emergency Drugs available, Suction available, Patient being monitored and Timeout performed Patient Re-evaluated:Patient Re-evaluated prior to inductionOxygen Delivery Method: Circle system utilized and Simple face mask Preoxygenation: Pre-oxygenation with 100% oxygen Intubation Type: IV induction Ventilation: Mask ventilation without difficulty Laryngoscope Size: Miller and 2 Grade View: Grade II Tube type: Oral Tube size: 7.5 mm Number of attempts: 1 Airway Equipment and Method: Patient positioned with wedge pillow and Stylet Placement Confirmation: ETT inserted through vocal cords under direct vision,  positive ETCO2 and breath sounds checked- equal and bilateral Secured at: 21 cm Tube secured with: Tape Dental Injury: Teeth and Oropharynx as per pre-operative assessment

## 2016-12-03 NOTE — Anesthesia Postprocedure Evaluation (Addendum)
Anesthesia Post Note  Patient: Lauren Pineda  Procedure(s) Performed: Procedure(s) (LRB): LAPAROSCOPIC CHOLECYSTECTOMY (N/A)  Patient location during evaluation: PACU Anesthesia Type: General Level of consciousness: awake and sedated Pain management: pain level controlled Vital Signs Assessment: post-procedure vital signs reviewed and stable Respiratory status: spontaneous breathing Cardiovascular status: stable Postop Assessment: no signs of nausea or vomiting Anesthetic complications: no        Last Vitals:  Vitals:   12/03/16 1300 12/03/16 1303  BP:  125/84  Pulse: 75 76  Resp: 12 15  Temp: 36.3 C     Last Pain:  Vitals:   12/03/16 1230  TempSrc:   PainSc: Asleep   Pain Goal: Patients Stated Pain Goal: 3 (12/03/16 0814)               Syliva Mee JR,JOHN Susann GivensFRANKLIN

## 2016-12-04 ENCOUNTER — Encounter (HOSPITAL_COMMUNITY): Payer: Self-pay | Admitting: General Surgery

## 2016-12-04 DIAGNOSIS — K819 Cholecystitis, unspecified: Secondary | ICD-10-CM | POA: Diagnosis present

## 2016-12-04 MED ORDER — ACETAMINOPHEN 325 MG PO TABS
650.0000 mg | ORAL_TABLET | Freq: Four times a day (QID) | ORAL | Status: DC
  Administered 2016-12-04 – 2016-12-06 (×10): 650 mg via ORAL
  Filled 2016-12-04 (×10): qty 2

## 2016-12-04 MED ORDER — METHOCARBAMOL 1000 MG/10ML IJ SOLN
500.0000 mg | Freq: Three times a day (TID) | INTRAVENOUS | Status: DC | PRN
  Administered 2016-12-05: 500 mg via INTRAVENOUS
  Filled 2016-12-04 (×3): qty 5

## 2016-12-04 MED ORDER — OXYCODONE HCL 5 MG PO TABS: 5.0000 mg | ORAL_TABLET | Freq: Four times a day (QID) | ORAL | 0 refills | Status: AC | PRN

## 2016-12-04 MED ORDER — ENOXAPARIN SODIUM 40 MG/0.4ML ~~LOC~~ SOLN
40.0000 mg | SUBCUTANEOUS | Status: DC
  Administered 2016-12-04 – 2016-12-06 (×3): 40 mg via SUBCUTANEOUS
  Filled 2016-12-04 (×3): qty 0.4

## 2016-12-04 MED ORDER — ACETAMINOPHEN 650 MG RE SUPP: 650.0000 mg | Freq: Four times a day (QID) | RECTAL | Status: DC

## 2016-12-04 MED ORDER — HYDROMORPHONE HCL 2 MG/ML IJ SOLN
1.0000 mg | INTRAMUSCULAR | Status: DC | PRN
  Administered 2016-12-04 – 2016-12-05 (×4): 1 mg via INTRAVENOUS
  Filled 2016-12-04 (×4): qty 1

## 2016-12-04 NOTE — Discharge Instructions (Signed)
°LAPAROSCOPIC SURGERY: POST OP INSTRUCTIONS  °1. DIET: Follow a light bland diet the first 24 hours after arrival home, such as soup, liquids, crackers, etc. Be sure to include lots of fluids daily. Avoid fast food or heavy meals as your are more likely to get nauseated. Eat a low fat the next few days after surgery.  °2. Take your usually prescribed home medications unless otherwise directed. °3. PAIN CONTROL:  °1. Pain is best controlled by a usual combination of three different methods TOGETHER:  °1. Ice/Heat °2. Over the counter pain medication °3. Prescription pain medication °2. Most patients will experience some swelling and bruising around the incisions. Ice packs or heating pads (30-60 minutes up to 6 times a day) will help. Use ice for the first few days to help decrease swelling and bruising, then switch to heat to help relax tight/sore spots and speed recovery. Some people prefer to use ice alone, heat alone, alternating between ice & heat. Experiment to what works for you. Swelling and bruising can take several weeks to resolve.  °3. It is helpful to take an over-the-counter pain medication regularly for the first few weeks. Choose one of the following that works best for you:  °1. Naproxen (Aleve, etc) Two 220mg tabs twice a day °2. Ibuprofen (Advil, etc) Three 200mg tabs four times a day (every meal & bedtime) °3. Acetaminophen (Tylenol, etc) 500-650mg four times a day (every meal & bedtime) °4. A prescription for pain medication (such as oxycodone, hydrocodone, etc) should be given to you upon discharge. Take your pain medication as prescribed.  °1. If you are having problems/concerns with the prescription medicine (does not control pain, nausea, vomiting, rash, itching, etc), please call us [phone] to see if we need to switch you to a different pain medicine that will work better for you and/or control your side effect better. °2. If you need a refill on your pain medication, please contact  your pharmacy. They will contact our office to request authorization. Prescriptions will not be filled after 5 pm or on week-ends. °4. Avoid getting constipated. Between the surgery and the pain medications, it is common to experience some constipation. Increasing fluid intake and taking a fiber supplement (such as Metamucil, Citrucel, FiberCon, MiraLax, etc) 1-2 times a day regularly will usually help prevent this problem from occurring. A mild laxative (prune juice, Milk of Magnesia, MiraLax, etc) should be taken according to package directions if there are no bowel movements after 48 hours.  °5. Watch out for diarrhea. If you have many loose bowel movements, simplify your diet to bland foods & liquids for a few days. Stop any stool softeners and decrease your fiber supplement. Switching to mild anti-diarrheal medications (Kayopectate, Pepto Bismol) can help. If this worsens or does not improve, please call us. °6. Wash / shower every day. You may shower over the dressings as they are waterproof. Continue to shower over incision(s) after the dressing is off. If there is glue over the incisions try not to pick it off, let it fall off naturally. °7. Remove your waterproof bandages 5 days after surgery. You may leave the incision open to air. You may replace a dressing/Band-Aid to cover the incision for comfort if you wish.  °8. ACTIVITIES as tolerated:  °1. You may resume regular (light) daily activities beginning the next day--such as daily self-care, walking, climbing stairs--gradually increasing activities as tolerated. If you can walk 30 minutes without difficulty, it is safe to try more intense activity   such as jogging, treadmill, bicycling, low-impact aerobics, swimming, etc. °2. Save the most intensive and strenuous activity for last such as sit-ups, heavy lifting, contact sports, etc Refrain from any heavy lifting or straining until you are off narcotics for pain control. For the first 2-3 weeks do not lift  over 10-15lb.  °3. DO NOT PUSH THROUGH PAIN. Let pain be your guide: If it hurts to do something, don't do it. Pain is your body warning you to avoid that activity for another week until the pain goes down. °4. You may drive when you are no longer taking prescription pain medication, you can comfortably wear a seatbelt, and you can safely maneuver your car and apply brakes. °5. You may have sexual intercourse when it is comfortable.  °9. FOLLOW UP in our office  °1. Please call [facility] at [phone] to set up an appointment to see your surgeon in the office for a follow-up appointment approximately 2-3 weeks after your surgery. °2. Make sure that you call for this appointment the day you arrive home to insure a convenient appointment time. °     10. IF YOU HAVE DISABILITY OR FAMILY LEAVE FORMS, BRING THEM TO THE               OFFICE FOR PROCESSING.  ° °WHEN TO CALL US [phone]:  °1. Poor pain control °2. Reactions / problems with new medications (rash/itching, nausea, etc)  °3. Fever over 101.5 F (38.5 C) °4. Inability to urinate °5. Nausea and/or vomiting °6. Worsening swelling or bruising °7. Continued bleeding from incision. °8. Increased pain, redness, or drainage from the incision ° °The clinic staff is available to answer your questions during regular business hours (8:30am-5pm). Please don’t hesitate to call and ask to speak to one of our nurses for clinical concerns.  °If you have a medical emergency, go to the nearest emergency room or call 911.  °A surgeon from [facility] is always on call at the hospitals  ° °[facility]  °[address], [address], [place], [place] [address] ?  °MAIN: [phone] ? TOLL FREE: [phone] ?  °FAX [phone]  °[email or web address] ° ° °

## 2016-12-04 NOTE — Progress Notes (Signed)
Patient ID: Lauren Pineda, female   DOB: 05-Sep-1986, 31 y.o.   MRN: [id number]  [facility]  1 Day Post-Op  Subjective: Patient reports persistent, severe abdominal pain. She is not passing flatus and has not ambulated since surgery. She has only taken in liquids, no solid food.   Objective: Vital signs in last 24 hours: Temp:  [97.3 F (36.3 C)-98.4 F (36.9 C)] 98.4 F (36.9 C) (03/20 0536) Pulse Rate:  [63-98] 63 (03/20 0536) Resp:  [12-21] 16 (03/20 0536) BP: (101-147)/(65-102) 110/65 (03/20 0536) SpO2:  [96 %-100 %] 100 % (03/20 0536) Last BM Date: 12/03/16  Intake/Output from previous day: 03/19 0701 - 03/20 0700 In: 1200 [I.V.:1200] Out: 25 [Blood:25] Intake/Output this shift: No intake/output data recorded.  PE: Gen:  Alert, NAD, pleasant Pulm:  Effort normal Abd: Soft, ND, appropriately tender, +BS, multiple lap incisions C/D/I Ext:  No erythema, edema, or tenderness  Lab Results:   Recent Labs  12/03/16 0757  WBC 7.3  HGB 12.6  HCT 39.6  PLT 314   BMET No results for input(s): NA, K, CL, CO2, GLUCOSE, BUN, CREATININE, CALCIUM in the last 72 hours. PT/INR No results for input(s): LABPROT, INR in the last 72 hours. CMP     Component Value Date/Time   NA 137 11/30/2016 1134   K 4.2 11/30/2016 1134   CL 104 11/30/2016 1134   CO2 26 11/30/2016 1134   GLUCOSE 97 11/30/2016 1134   BUN 7 11/30/2016 1134   CREATININE 0.62 11/30/2016 1134   CALCIUM 9.3 11/30/2016 1134   PROT 7.0 11/30/2016 1134   ALBUMIN 4.0 11/30/2016 1134   AST 18 11/30/2016 1134   ALT 13 (L) 11/30/2016 1134   ALKPHOS 62 11/30/2016 1134   BILITOT 0.5 11/30/2016 1134   GFRNONAA >60 11/30/2016 1134   GFRAA >60 11/30/2016 1134   Lipase     Component Value Date/Time   LIPASE 16 11/30/2016 1134       Studies/Results: No results found.  Anti-infectives: Anti-infectives    Start     Dose/Rate Route Frequency Ordered Stop   12/03/16 0749   ceFAZolin (ANCEF) IVPB 2g/100 mL premix     2 g 200 mL/hr over 30 Minutes Intravenous On call to O.R. 12/03/16 0749 12/03/16 1000       Assessment/Plan Gallstones S/p LAPAROSCOPIC CHOLECYSTECTOMY 3/19 Kinsinger - POD 1  ID - ancef perioperative FEN - regular diet VTE - SCDs, lovenox  Plan - add scheduled tylenol, continue toradol, add robaxin PRN for better pain control. Decrease morphine to only for breakthrough pain. Patient needs to ambulate today and tolerate regular food before she is ready for discharge.   LOS: 0 days    Lauren Pineda , [facility] 12/04/2016, 8:06 AM Pager: [phone] Consults: [phone] Mon-Fri 7:00 am-4:30 pm Sat-Sun 7:00 am-11:30 am

## 2016-12-04 NOTE — Progress Notes (Addendum)
Patient arrived to 6n27 alert and oriented, IV intact, was able to walk from stretcher to bed with minimal assitance, small lap sites noted to abdomen. She is stating she is in a lot of pain and needs pain medicine now, will give her what is ordered, VSS, no family present. Oriented to room and staff, will continue to monitor.

## 2016-12-04 NOTE — Progress Notes (Signed)
Pt. States she "vomited a small amount with some yellow things in it". Emesis was not observed by RN.

## 2016-12-05 LAB — COMPREHENSIVE METABOLIC PANEL
ALK PHOS: 48 U/L (ref 38–126)
ALT: 22 U/L (ref 14–54)
AST: 21 U/L (ref 15–41)
Albumin: 3.1 g/dL — ABNORMAL LOW (ref 3.5–5.0)
Anion gap: 5 (ref 5–15)
BILIRUBIN TOTAL: 0.5 mg/dL (ref 0.3–1.2)
BUN: 6 mg/dL (ref 6–20)
CALCIUM: 8.3 mg/dL — AB (ref 8.9–10.3)
CO2: 30 mmol/L (ref 22–32)
Chloride: 101 mmol/L (ref 101–111)
Creatinine, Ser: 0.67 mg/dL (ref 0.44–1.00)
GFR calc Af Amer: >60 mL/min (ref >60)
GLUCOSE: 88 mg/dL (ref 65–99)
POTASSIUM: 3.9 mmol/L (ref 3.5–5.1)
Sodium: 136 mmol/L (ref 135–145)
TOTAL PROTEIN: 5.9 g/dL — AB (ref 6.5–8.1)

## 2016-12-05 LAB — CBC
HEMATOCRIT: 28.9 % — AB (ref 36.0–46.0)
Hemoglobin: 9.1 g/dL — ABNORMAL LOW (ref 12.0–15.0)
MCH: 28.5 pg (ref 26.0–34.0)
MCHC: 31.5 g/dL (ref 30.0–36.0)
MCV: 90.6 fL (ref 78.0–100.0)
PLATELETS: 251 10*3/uL (ref 150–400)
RBC: 3.19 MIL/uL — ABNORMAL LOW (ref 3.87–5.11)
RDW: 13.8 % (ref 11.5–15.5)
WBC: 8.7 10*3/uL (ref 4.0–10.5)

## 2016-12-05 MED ORDER — PROMETHAZINE HCL 25 MG/ML IJ SOLN
12.5000 mg | Freq: Once | INTRAMUSCULAR | Status: AC
  Administered 2016-12-05: 12.5 mg via INTRAVENOUS
  Filled 2016-12-05: qty 1

## 2016-12-05 MED ORDER — METHOCARBAMOL 500 MG PO TABS
500.0000 mg | ORAL_TABLET | Freq: Four times a day (QID) | ORAL | Status: DC | PRN
  Administered 2016-12-06: 500 mg via ORAL
  Filled 2016-12-05 (×3): qty 1

## 2016-12-05 MED ORDER — HYDROMORPHONE HCL 1 MG/ML IJ SOLN
0.5000 mg | INTRAMUSCULAR | Status: DC | PRN
  Administered 2016-12-05 – 2016-12-06 (×3): 0.5 mg via INTRAVENOUS
  Filled 2016-12-05 (×3): qty 1

## 2016-12-05 MED ORDER — METOCLOPRAMIDE HCL 5 MG PO TABS
5.0000 mg | ORAL_TABLET | Freq: Four times a day (QID) | ORAL | Status: DC | PRN
  Administered 2016-12-05 – 2016-12-06 (×3): 5 mg via ORAL
  Filled 2016-12-05 (×4): qty 1

## 2016-12-05 MED ORDER — HYDROMORPHONE HCL 2 MG/ML IJ SOLN
0.5000 mg | INTRAMUSCULAR | Status: DC | PRN
  Administered 2016-12-05: 0.5 mg via INTRAVENOUS
  Filled 2016-12-05: qty 1

## 2016-12-05 NOTE — Progress Notes (Signed)
[facility] Progress Note  2 Days Post-Op  Subjective: Pt still states she cannot keep food down. She is able to tolerated ice chips and her PO meds without vomiting. Pt states she vomited 3 times last night. No blood in her vomiting. She states she has not had flatus. She has not been out of bed except to the bathroom. No other new complaints  Objective: Vital signs in last 24 hours: Temp:  [98.3 F (36.8 C)-98.6 F (37 C)] 98.4 F (36.9 C) (03/21 0458) Pulse Rate:  [62-66] 65 (03/21 0458) Resp:  [19] 19 (03/21 0458) BP: (99-108)/(60-76) 99/60 (03/21 0458) SpO2:  [100 %] 100 % (03/21 0458) Last BM Date: 12/03/16  Intake/Output from previous day: 03/20 0701 - 03/21 0700 In: 240 [P.O.:240] Out: 400 [Emesis/NG output:400] Intake/Output this shift: No intake/output data recorded.  PE: Gen:  Alert, NAD, pleasant, cooperative, well appearing Card:  RRR, no M/G/R heard Pulm:  Rate and effort normal Abd: Soft, not distended, +BS, RUQ TTP, lap incisions without surrounding erythema or edema, no discharge Skin: no rashes noted, warm and dry  Lab Results:   Recent Labs  12/03/16 0757 12/05/16 0358  WBC 7.3 8.7  HGB 12.6 9.1*  HCT 39.6 28.9*  PLT 314 251   BMET  Recent Labs  12/05/16 0358  NA 136  K 3.9  CL 101  CO2 30  GLUCOSE 88  BUN 6  CREATININE 0.67  CALCIUM 8.3*   PT/INR No results for input(s): LABPROT, INR in the last 72 hours. CMP     Component Value Date/Time   NA 136 12/05/2016 0358   K 3.9 12/05/2016 0358   CL 101 12/05/2016 0358   CO2 30 12/05/2016 0358   GLUCOSE 88 12/05/2016 0358   BUN 6 12/05/2016 0358   CREATININE 0.67 12/05/2016 0358   CALCIUM 8.3 (L) 12/05/2016 0358   PROT 5.9 (L) 12/05/2016 0358   ALBUMIN 3.1 (L) 12/05/2016 0358   AST 21 12/05/2016 0358   ALT 22 12/05/2016 0358   ALKPHOS 48 12/05/2016 0358   BILITOT 0.5 12/05/2016 0358   GFRNONAA >60 12/05/2016 0358   GFRAA >60 12/05/2016 0358   Lipase      Component Value Date/Time   LIPASE 16 11/30/2016 1134       Studies/Results: No results found.  Anti-infectives: Anti-infectives    Start     Dose/Rate Route Frequency Ordered Stop   12/03/16 0749  ceFAZolin (ANCEF) IVPB 2g/100 mL premix     2 g 200 mL/hr over 30 Minutes Intravenous On call to O.R. 12/03/16 0749 12/03/16 1000       Assessment/Plan Gallstones S/p LAPAROSCOPIC CHOLECYSTECTOMY 3/19 Kinsinger - vomiting with food but not with PO meds - + BS but pt denies flatus, no abdominal distention   ID - ancef perioperative FEN - regular diet VTE - SCDs, lovenox  Plan - scheduled tylenol, toradol, robaxin PRN for better pain control. Decrease morphine to only for breakthrough pain. Patient needs to ambulate today and tolerate regular food before she is ready for discharge. Added Reglan for nausea.   LOS: 1 day    Jerre SimonJessica L Risha Barretta , [facility] 12/05/2016, 7:36 AM Pager: [phone] Consults: [phone] Mon-Fri 7:00 am-4:30 pm Sat-Sun 7:00 am-11:30 am

## 2016-12-06 MED ORDER — ONDANSETRON HCL 4 MG PO TABS: 4.0000 mg | ORAL_TABLET | Freq: Three times a day (TID) | ORAL | 0 refills | Status: DC | PRN

## 2016-12-06 NOTE — Progress Notes (Signed)
3 Days Post-Op  [facility] Progress Note  Subjective: Nausea improving with Reglan. States she wants to have a BM and feels like she needs to have BM but is only passing flatus. Describes expected incisional tenderness.   Objective: Vital signs in last 24 hours: Temp:  [98.1 F (36.7 C)-98.2 F (36.8 C)] 98.1 F (36.7 C) (03/21 2151) Pulse Rate:  [74-77] 74 (03/21 2151) Resp:  [19] 19 (03/21 2151) BP: (112-114)/(80-83) 112/83 (03/21 2151) SpO2:  [100 %] 100 % (03/21 2151) Last BM Date: 12/03/16  Intake/Output from previous day: 03/21 0701 - 03/22 0700 In: 120 [P.O.:120] Out: -  Intake/Output this shift: No intake/output data recorded.  PE: General: pleasant, WD, WN AA female who is seated on bed eating breakfast (bagel and cream cheese) HEENT: head is normocephalic, atraumatic.  Sclera are not injected or icteric. Mouth is pink and moist Heart: regular, rate, and rhythm.  Normal s1,s2. No obvious murmurs. Lungs: CTAB, no wheezes, rhonchi, or rales noted.  Respiratory effort nonlabored Abd: soft, expected incisional tenderness, ND, hypoactive BS. Incisions clean and dry with wound adhesive. Skin: warm and dry.  Psych: A&Ox3 with an appropriate affect.  Lab Results:   Recent Labs  12/05/16 0358  WBC 8.7  HGB 9.1*  HCT 28.9*  PLT 251   BMET  Recent Labs  12/05/16 0358  NA 136  K 3.9  CL 101  CO2 30  GLUCOSE 88  BUN 6  CREATININE 0.67  CALCIUM 8.3*   PT/INR No results for input(s): LABPROT, INR in the last 72 hours. CMP     Component Value Date/Time   NA 136 12/05/2016 0358   K 3.9 12/05/2016 0358   CL 101 12/05/2016 0358   CO2 30 12/05/2016 0358   GLUCOSE 88 12/05/2016 0358   BUN 6 12/05/2016 0358   CREATININE 0.67 12/05/2016 0358   CALCIUM 8.3 (L) 12/05/2016 0358   PROT 5.9 (L) 12/05/2016 0358   ALBUMIN 3.1 (L) 12/05/2016 0358   AST 21 12/05/2016 0358   ALT 22 12/05/2016 0358   ALKPHOS 48 12/05/2016 0358   BILITOT 0.5 12/05/2016  0358   GFRNONAA >60 12/05/2016 0358   GFRAA >60 12/05/2016 0358   Lipase     Component Value Date/Time   LIPASE 16 11/30/2016 1134       Studies/Results: No results found.  Anti-infectives: Anti-infectives    Start     Dose/Rate Route Frequency Ordered Stop   12/03/16 0749  ceFAZolin (ANCEF) IVPB 2g/100 mL premix     2 g 200 mL/hr over 30 Minutes Intravenous On call to O.R. 12/03/16 0749 12/03/16 1000       Assessment/Plan POD # 3 S/P LAPAROSCOPIC CHOLECYSTECTOMY3/19 Dr. Sheliah HatchKinsinger  ID -afebrile. No leukocytosis FEN - regular diet, nausea under better control with Reglan VTE - SCDs, lovenox  Plan - scheduled tylenol, toradol, robaxin PRN for better pain control. If she tolerates regular diet today, can discharge to home this evening. Needs to ambulate several times today.   LOS: 2 days    LEE Linlee Cromie, [facility] 12/06/2016, 11:26 AM

## 2016-12-06 NOTE — Progress Notes (Signed)
D/C papers gone over with pt. No questions/complaints. Prescriptions given to pt. Coupons for prescriptions also given to pt. By other RN. Pt. Took her own IV out. Pt. D/c'd successfully. Refused w/c. Doctor's note also given to pt.

## 2016-12-06 NOTE — Care Management Note (Signed)
Case Management Note  Patient Details  Name: Lauren Pineda MRN: [id number] Date of Birth: [birth year]  Subjective/Objective:                    Action/Plan: Pt discharging home with self care. Pt without insurance and no PCP. Pt was set up with [facility] in the past. Pt has not seen them. CM provided her the information for [facility] and [facility].  CM provided her coupons for her pain and nausea medication. Patient states she can afford these meds with the coupons.  Pt has transportation home.  Expected Discharge Date:  12/06/16               Expected Discharge Plan:  Home/Self Care  In-House Referral:     Discharge planning Services  CM Consult, Medication Assistance  Post Acute Care Choice:    Choice offered to:     DME Arranged:    DME Agency:     HH Arranged:    HH Agency:     Status of Service:  Completed, signed off  If discussed at [employer] Length of Stay Meetings, dates discussed:    Additional Comments:  Kermit BaloKelli F Imanuel Pruiett, RN 12/06/2016, 5:03 PM

## 2016-12-06 NOTE — Discharge Summary (Signed)
  [facility] Discharge Summary   Patient ID: Lauren Pineda MRN: [id number] DOB/AGE: 1986-07-19 31 y.o.  Admit date: 12/03/2016 Discharge date: 12/06/2016  Admitting Diagnosis: Biliary colic  Discharge Diagnosis Patient Active Problem List   Diagnosis Date Noted  . Cholecystitis 12/04/2016  . Cholecystitis, chronic 12/03/2016  . Incisional pain 02/20/2016  . Cesarean delivery delivered 02/15/2016  . Back pain affecting pregnancy 01/29/2016  . Sciatica of left side 01/07/2016  . Previous cesarean section x 3 12/19/2015  . H/O premature delivery x 2--one spontaneous, one induced 12/19/2015  . 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 History of prior pregnancy with IUGR newborn 12/19/2015  . Anemia 12/19/2015    Consultants None  Imaging: U/s abdomen limited 11/19/16: Multiple gallstones. There is no sonographic evidence of acute cholecystitis. Normal appearance of the liver and common bile duct.  Procedures Dr. Sheliah HatchKinsinger (12/03/16) - Laparoscopic Cholecystectomy  Hospital Course:  Lauren Pineda is a 31yo [facility] with multiple bouts of epigastric abdominal pain. She had been seen 3 days prior in ED and found to have chronic cholecystitis.  Admitted and underwent procedure listed above.  Tolerated procedure well and was transferred to the floor.  Diet was advanced as tolerated.  She did have pain that was difficult to control postoperatively, but this improved with time. Her postop LFTs were WNL and she had no signs/symptoms of infection. On POD3 the patient was voiding well, tolerating diet, ambulating well, pain well controlled, vital signs stable, incisions c/d/i and felt stable for discharge home.  Patient will follow up in our office in 2 weeks and knows to call with questions or concerns.  She will call to confirm appointment date/time.    I have personally reviewed the patients medication history  on the [place] controlled substance database.    Allergies as of 12/06/2016      Reactions   Latex Itching      Medication List    STOP taking these medications   HYDROcodone-acetaminophen 5-325 MG tablet Commonly known as:  NORCO/VICODIN   oxyCODONE-acetaminophen 5-325 MG tablet Commonly known as:  PERCOCET/ROXICET     TAKE these medications   ibuprofen 800 MG tablet Commonly known as:  ADVIL,MOTRIN Take 1 tablet (800 mg total) by mouth every 8 (eight) hours as needed. What changed:  when to take this   ondansetron 4 MG tablet Commonly known as:  ZOFRAN Take 1 tablet (4 mg total) by mouth every 8 (eight) hours as needed for nausea or vomiting.   oxyCODONE 5 MG immediate release tablet Commonly known as:  Oxy IR/ROXICODONE Take 1-2 tablets (5-10 mg total) by mouth every 6 (six) hours as needed for moderate pain.        Follow-up Information    De BlanchLuke Aaron Kinsinger, MD. Call in 3 week(s).   Specialty:  General Surgery Why:  Please call as soon as you leave the hospital to make a follow-up appointment. Contact information: [address] [place] [address] [phone]           Signed: Edson SnowballBROOKE A MILLER, [facility] 12/06/2016, 3:52 PM Pager: [phone] Consults: [phone] Mon-Fri 7:00 am-4:30 pm Sat-Sun 7:00 am-11:30 am

## 2017-02-22 NOTE — Addendum Note (Signed)
Addendum  created 02/22/17 1109 by Arleta Ostrum, MD   Sign clinical note    

## 2017-05-16 ENCOUNTER — Emergency Department (HOSPITAL_COMMUNITY)
Admission: EM | Admit: 2017-05-16 | Discharge: 2017-05-17 | Disposition: A | Discharge status: Home / Self Care | Payer: Medicaid Other | Attending: Physician Assistant | Admitting: Physician Assistant

## 2017-05-16 ENCOUNTER — Emergency Department (HOSPITAL_COMMUNITY): Payer: Medicaid Other

## 2017-05-16 ENCOUNTER — Encounter (HOSPITAL_COMMUNITY): Payer: Self-pay | Admitting: Emergency Medicine

## 2017-05-16 DIAGNOSIS — R112 Nausea with vomiting, unspecified: Secondary | ICD-10-CM | POA: Insufficient documentation

## 2017-05-16 DIAGNOSIS — R197 Diarrhea, unspecified: Secondary | ICD-10-CM | POA: Insufficient documentation

## 2017-05-16 DIAGNOSIS — K2900 Acute gastritis without bleeding: Secondary | ICD-10-CM | POA: Insufficient documentation

## 2017-05-16 DIAGNOSIS — Z720 Tobacco use: Secondary | ICD-10-CM | POA: Insufficient documentation

## 2017-05-16 DIAGNOSIS — Z91040 Latex allergy status: Secondary | ICD-10-CM | POA: Insufficient documentation

## 2017-05-16 LAB — CBC
HCT: 38.9 % (ref 36.0–46.0)
Hemoglobin: 12.2 g/dL (ref 12.0–15.0)
MCH: 28.8 pg (ref 26.0–34.0)
MCHC: 31.4 g/dL (ref 30.0–36.0)
MCV: 91.7 fL (ref 78.0–100.0)
Platelets: 358 K/uL (ref 150–400)
RBC: 4.24 MIL/uL (ref 3.87–5.11)
RDW: 14.6 % (ref 11.5–15.5)
WBC: 11.1 K/uL — ABNORMAL HIGH (ref 4.0–10.5)

## 2017-05-16 LAB — I-STAT BETA HCG BLOOD, ED (MC, WL, AP ONLY): I-stat hCG, quantitative: <5 m[IU]/mL

## 2017-05-16 LAB — URINALYSIS, ROUTINE W REFLEX MICROSCOPIC
BACTERIA UA: NONE SEEN
BILIRUBIN URINE: NEGATIVE
GLUCOSE, UA: NEGATIVE mg/dL
HGB URINE DIPSTICK: NEGATIVE
KETONES UR: NEGATIVE mg/dL
NITRITE: NEGATIVE
PROTEIN: 100 mg/dL — AB
Specific Gravity, Urine: 1.034 — ABNORMAL HIGH (ref 1.005–1.030)
pH: 5 (ref 5.0–8.0)

## 2017-05-16 LAB — COMPREHENSIVE METABOLIC PANEL
ALBUMIN: 3.9 g/dL (ref 3.5–5.0)
ALK PHOS: 75 U/L (ref 38–126)
ALT: 11 U/L — ABNORMAL LOW (ref 14–54)
ANION GAP: 7 (ref 5–15)
AST: 17 U/L (ref 15–41)
BILIRUBIN TOTAL: 0.6 mg/dL (ref 0.3–1.2)
BUN: 5 mg/dL — ABNORMAL LOW (ref 6–20)
CALCIUM: 9.3 mg/dL (ref 8.9–10.3)
CO2: 27 mmol/L (ref 22–32)
Chloride: 104 mmol/L (ref 101–111)
Creatinine, Ser: 0.72 mg/dL (ref 0.44–1.00)
GFR calc non Af Amer: >60 mL/min (ref >60)
Glucose, Bld: 105 mg/dL — ABNORMAL HIGH (ref 65–99)
POTASSIUM: 3.7 mmol/L (ref 3.5–5.1)
Sodium: 138 mmol/L (ref 135–145)
TOTAL PROTEIN: 7.1 g/dL (ref 6.5–8.1)

## 2017-05-16 LAB — LIPASE, BLOOD: Lipase: 30 U/L (ref 11–51)

## 2017-05-16 MED ORDER — OMEPRAZOLE 20 MG PO CPDR: 20.0000 mg | DELAYED_RELEASE_CAPSULE | Freq: Every day | ORAL | 0 refills | Status: AC

## 2017-05-16 MED ORDER — AMOXICILLIN-POT CLAVULANATE 875-125 MG PO TABS
1.0000 | ORAL_TABLET | Freq: Once | ORAL | Status: AC
  Administered 2017-05-17: 1 via ORAL
  Filled 2017-05-16: qty 1

## 2017-05-16 MED ORDER — IOPAMIDOL (ISOVUE-300) INJECTION 61%
INTRAVENOUS | Status: AC
  Administered 2017-05-16: 100 mL
  Filled 2017-05-16: qty 100

## 2017-05-16 MED ORDER — FENTANYL CITRATE (PF) 100 MCG/2ML IJ SOLN
25.0000 ug | Freq: Once | INTRAMUSCULAR | Status: AC
  Administered 2017-05-16: 25 ug via INTRAVENOUS
  Filled 2017-05-16: qty 2

## 2017-05-16 MED ORDER — OXYCODONE-ACETAMINOPHEN 5-325 MG PO TABS
1.0000 | ORAL_TABLET | Freq: Once | ORAL | Status: AC
  Administered 2017-05-17: 1 via ORAL
  Filled 2017-05-16: qty 1

## 2017-05-16 MED ORDER — ONDANSETRON HCL 4 MG/2ML IJ SOLN
4.0000 mg | Freq: Once | INTRAMUSCULAR | Status: AC
  Administered 2017-05-16: 4 mg via INTRAVENOUS
  Filled 2017-05-16: qty 2

## 2017-05-16 MED ORDER — ONDANSETRON 4 MG PO TBDP: 4.0000 mg | ORAL_TABLET | Freq: Three times a day (TID) | ORAL | 0 refills | Status: AC | PRN

## 2017-05-16 MED ORDER — OXYCODONE-ACETAMINOPHEN 5-325 MG PO TABS
ORAL_TABLET | ORAL | Status: AC
  Administered 2017-05-16: 1
  Filled 2017-05-16: qty 1

## 2017-05-16 MED ORDER — METOCLOPRAMIDE HCL 5 MG/ML IJ SOLN
10.0000 mg | Freq: Once | INTRAMUSCULAR | Status: AC
  Administered 2017-05-16: 10 mg via INTRAVENOUS
  Filled 2017-05-16: qty 2

## 2017-05-16 MED ORDER — ONDANSETRON 4 MG PO TBDP
4.0000 mg | ORAL_TABLET | Freq: Once | ORAL | Status: AC | PRN
  Administered 2017-05-16: 4 mg via ORAL

## 2017-05-16 MED ORDER — ONDANSETRON 4 MG PO TBDP
ORAL_TABLET | ORAL | Status: AC
  Filled 2017-05-16: qty 1

## 2017-05-16 MED ORDER — AMOXICILLIN-POT CLAVULANATE 875-125 MG PO TABS: 1.0000 | ORAL_TABLET | Freq: Two times a day (BID) | ORAL | 0 refills | Status: AC

## 2017-05-16 MED ORDER — SUCRALFATE 1 GM/10ML PO SUSP: 1.0000 g | Freq: Three times a day (TID) | ORAL | 0 refills | Status: AC

## 2017-05-16 NOTE — ED Provider Notes (Signed)
Sign out from Dr. Corlis LeakMackuen at shift change  Briefly, patient's is a 31 year old female status post cholecystectomy 4 months ago after chronic abdominal pain. CT abdomen pelvis pending. Labs unremarkable. If CTAP negative, d/c home with omeprazole, Carafate with follow-up to surgeon or PCP.  CT abdomen pelvis shows mild wall thickening involving the transverse colon, splenic flexure, descending and proximal sigmoid colon suspicious for colitis of infectious or inflammatory etiology; negative for bowel obstruction; small amount of free fluid in the pelvis. We'll discharge home with Augmentin, omeprazole, Carafate, Zofran with follow-up to PCP or gastroenterology if symptoms are not improving. Patient tolerating PO prior to discharge. Patient vitals stable throughout ED course and discharged in satisfactory condition.    Emi HolesLaw, Redell Nazir M, PA-C 05/17/17 0123    Abelino DerrickMackuen, Courteney Lyn, MD 05/25/17 [phone]

## 2017-05-16 NOTE — ED Notes (Signed)
Patient transported to CT 

## 2017-05-16 NOTE — ED Notes (Signed)
Per chart, pt had gallbladder removed in March 18' NOT 2 mo ago

## 2017-05-16 NOTE — ED Notes (Signed)
Pt poor historian, states she is having mid abd pain with N/V. Pt had gallbladder removed approx 2 mo ago.

## 2017-05-16 NOTE — ED Notes (Signed)
Pt states that the pain has been coming and going and more serve today

## 2017-05-16 NOTE — ED Notes (Signed)
Emesis X1 in triage

## 2017-05-16 NOTE — ED Provider Notes (Signed)
[facility]-EMERGENCY DEPT Provider Note   CSN: [id number] Arrival date & time: 05/16/17  1730     History   Chief Complaint Chief Complaint  Patient presents with  . Abdominal Pain    HPI Lauren Pineda is a 31 y.o. female.  HPI  Patient's 31 year old female presenting with abdominal pain. She feels like something is "wrong where she had her surgery". Patient had a cholecystectomy done 4 months ago. Patient report pains that started last night. She reports nauusea and vomiting occasionally.   Of note patient's gallbladder was removed because of assumoption of chronic cholecystitis because patient had come to the emergency department multiple times with severe abdominal pain. . Patient never had any gallbladder findings on imaging.  Now patient returning again with similar pain.  + diarrhea. No blood in vomit, + vomiting.  Past Medical History:  Diagnosis Date  . Anemia   . Cholecystitis 11/2016  . Medical history non-contributory     Patient Active Problem List   Diagnosis Date Noted  . Cholecystitis 12/04/2016  . Cholecystitis, chronic 12/03/2016  . Incisional pain 02/20/2016  . Cesarean delivery delivered 02/15/2016  . Back pain affecting pregnancy 01/29/2016  . Sciatica of left side 01/07/2016  . Previous cesarean section x 3 12/19/2015  . H/O premature delivery x 2--one spontaneous, one induced 12/19/2015  . 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 History of prior pregnancy with IUGR newborn 12/19/2015  . Anemia 12/19/2015    Past Surgical History:  Procedure Laterality Date  . CESAREAN SECTION    . CESAREAN SECTION N/A 11/23/2012   Procedure: CESAREAN SECTION;  Surgeon: Kathreen Cosier, MD;  Location: [facility];  Service: Obstetrics;  Laterality: N/A;  Repeat Cesarean Section Delivery Baby    @      , Apgars   . CESAREAN SECTION N/A 02/15/2016   Procedure: CESAREAN SECTION;  Surgeon: Silverio Lay, MD;  Location: [facility] BIRTHING  SUIT[facility];  Service: Obstetrics;  Laterality: N/A;  . CHOLECYSTECTOMY N/A 12/03/2016   Procedure: LAPAROSCOPIC CHOLECYSTECTOMY;  Surgeon: Rodman Pickle, MD;  Location: [facility] OR;  Service: General;  Laterality: N/A;  . DILATION AND EVACUATION N/A 04/30/2014   Procedure: DILATATION AND EVACUATION;  Surgeon: Kathreen Cosier, MD;  Location: [facility] ORS;  Service: Gynecology;  Laterality: N/A;    OB History    Gravida Para Term Preterm AB Living   8 5 3 2 3 5    SAB TAB Ectopic Multiple Live Births   3     0 5       Home Medications    Prior to Admission medications   Medication Sig Start Date End Date Taking? Authorizing Provider  ibuprofen (ADVIL,MOTRIN) 800 MG tablet Take 1 tablet (800 mg total) by mouth every 8 (eight) hours as needed. 12/03/16   Kinsinger, De Blanch, MD  ondansetron (ZOFRAN) 4 MG tablet Take 1 tablet (4 mg total) by mouth every 8 (eight) hours as needed for nausea or vomiting. 12/06/16   Meuth, Nehemiah Settle A, PA-C  oxyCODONE (OXY IR/ROXICODONE) 5 MG immediate release tablet Take 1-2 tablets (5-10 mg total) by mouth every 6 (six) hours as needed for moderate pain. 12/04/16   Meuth, Lina Sar, PA-C    Family History Family History  Problem Relation Age of Onset  . Asthma Father   . Asthma Brother     Social History Social History  Substance Use Topics  . Smoking status: Current Some Day Smoker  Packs/day: 0.25  . Smokeless tobacco: Never Used  . Alcohol use No     Allergies   Latex   Review of Systems Review of Systems  Constitutional: Negative for activity change.  Respiratory: Negative for shortness of breath.   Cardiovascular: Negative for chest pain.  Gastrointestinal: Positive for abdominal pain, diarrhea, nausea and vomiting.     Physical Exam Updated Vital Signs BP 127/88   Pulse 77   Temp 98.1 F (36.7 C) (Oral)   Resp 18   Ht 5\' 4"  (1.626 m)   Wt 83.9 kg (185 lb)   LMP 04/29/2017 (Exact Date)   SpO2 99%   BMI 31.76 kg/m   Physical  Exam  Constitutional: She is oriented to person, place, and time. She appears well-developed and well-nourished.  HENT:  Head: Normocephalic and atraumatic.  Eyes: Right eye exhibits no discharge.  Cardiovascular: Normal rate, regular rhythm and normal heart sounds.   No murmur heard. Pulmonary/Chest: Effort normal and breath sounds normal. She has no wheezes. She has no rales.  Abdominal: Soft. She exhibits no distension. There is tenderness.  Small incisional scar epigastrium.  She reports pain diffusely, however distracted exam.  Neurological: She is oriented to person, place, and time.  Skin: Skin is warm and dry. She is not diaphoretic.  Psychiatric: She has a normal mood and affect.  Nursing note and vitals reviewed.    ED Treatments / Results  Labs (all labs ordered are listed, but only abnormal results are displayed) Labs Reviewed  COMPREHENSIVE METABOLIC PANEL - Abnormal; Notable for the following:       Result Value   Glucose, Bld 105 (*)    BUN 5 (*)    ALT 11 (*)    All other components within normal limits  CBC - Abnormal; Notable for the following:    WBC 11.1 (*)    All other components within normal limits  LIPASE, BLOOD  URINALYSIS, ROUTINE W REFLEX MICROSCOPIC  I-STAT BETA HCG BLOOD, ED ([facility], [facility], [facility] ONLY)    EKG  EKG Interpretation None       Radiology No results found.  Procedures Procedures (including critical care time)  Medications Ordered in ED Medications  ondansetron (ZOFRAN-ODT) disintegrating tablet 4 mg (4 mg Oral Given 05/16/17 1918)  oxyCODONE-acetaminophen (PERCOCET/ROXICET) 5-325 MG per tablet (1 tablet  Given 05/16/17 1919)     Initial Impression / Assessment and Plan / ED Course  I have reviewed the triage vital signs and the nursing notes.  Pertinent labs & imaging results that were available during my care of the patient were reviewed by me and considered in my medical decision making (see chart for details).    Patient's  31 year old female presenting with abdominal pain. She feels like something is "wrong where she had her surgery". Patient had a cholecystectomy done 4 months ago. Patient report pains that started last night. She reports nauusea and vomiting occasionally.   Of note patient's gallbladder was removed because of assumoption of chronic cholecystitis because patient had come to the emergency department multiple times with severe abdominal pain. . Patient never had any gallbladder findings on imaging.  Now patient returning again with similar pain.  + diarrhea. No blood in vomit, + vomiting.  11:00 PM Will get CAT scan because the patient's reported pain however she appears very well on exam with normal vital signs.   If negative, will treat for gastritis and have her follow-up as an outpatient  CT showed colitis, explained to  patient, and will have her trial PO abx and follow up with GI as needed after course of abx.   Final Clinical Impressions(s) / ED Diagnoses   Final diagnoses:  None    New Prescriptions New Prescriptions   No medications on file     Abelino Derrick, MD 05/25/17 [phone]

## 2017-05-16 NOTE — ED Notes (Signed)
Pt tells me that she is not feeling well, she states that she threw up in the bathroom.  Emesis bag given

## 2018-02-20 ENCOUNTER — Other Ambulatory Visit: Payer: Self-pay

## 2018-02-20 ENCOUNTER — Encounter (HOSPITAL_COMMUNITY): Payer: Self-pay | Admitting: Emergency Medicine

## 2018-02-20 ENCOUNTER — Emergency Department (HOSPITAL_COMMUNITY)
Admission: EM | Admit: 2018-02-20 | Discharge: 2018-02-21 | Discharge status: Against Medical Advice | Payer: Self-pay | Attending: Emergency Medicine | Admitting: Emergency Medicine

## 2018-02-20 ENCOUNTER — Emergency Department (HOSPITAL_COMMUNITY): Payer: Self-pay

## 2018-02-20 DIAGNOSIS — M7918 Myalgia, other site: Secondary | ICD-10-CM | POA: Insufficient documentation

## 2018-02-20 DIAGNOSIS — Z5320 Procedure and treatment not carried out because of patient's decision for unspecified reasons: Secondary | ICD-10-CM | POA: Insufficient documentation

## 2018-02-20 DIAGNOSIS — R0981 Nasal congestion: Secondary | ICD-10-CM | POA: Insufficient documentation

## 2018-02-20 DIAGNOSIS — J111 Influenza due to unidentified influenza virus with other respiratory manifestations: Secondary | ICD-10-CM | POA: Insufficient documentation

## 2018-02-20 DIAGNOSIS — F17210 Nicotine dependence, cigarettes, uncomplicated: Secondary | ICD-10-CM | POA: Insufficient documentation

## 2018-02-20 DIAGNOSIS — R0602 Shortness of breath: Secondary | ICD-10-CM | POA: Insufficient documentation

## 2018-02-20 DIAGNOSIS — R05 Cough: Secondary | ICD-10-CM | POA: Insufficient documentation

## 2018-02-20 DIAGNOSIS — R69 Illness, unspecified: Secondary | ICD-10-CM

## 2018-02-20 MED ORDER — IPRATROPIUM-ALBUTEROL 0.5-2.5 (3) MG/3ML IN SOLN
3.0000 mL | RESPIRATORY_TRACT | Status: DC
  Administered 2018-02-21: 3 mL via RESPIRATORY_TRACT
  Filled 2018-02-20: qty 3

## 2018-02-20 MED ORDER — NAPROXEN 500 MG PO TABS
500.0000 mg | ORAL_TABLET | Freq: Once | ORAL | Status: AC
  Administered 2018-02-21: 500 mg via ORAL
  Filled 2018-02-20: qty 1

## 2018-02-20 NOTE — ED Provider Notes (Signed)
[facility]-EMERGENCY DEPT Provider Note: Lauren Dell, MD, FACEP  CSN: [id number] MRN: [id number] ARRIVAL: 02/20/18 at 1953 ROOM: [facility]11/[facility]11   CHIEF COMPLAINT  Flu-Like Symptoms   HISTORY OF PRESENT ILLNESS  02/20/18 11:41 PM Lauren Pineda is a 32 y.o. female with a 2-day history of flulike symptoms.  Specifically she has had generalized body aches, cough productive of sputum that is sometimes blood streaked, sore throat, nasal congestion and dysphonia.  She denies nausea, vomiting or diarrhea.  She took Tylenol about 5 PM without relief of her body aches.  She was noted to have a low-grade fever on arrival.  She is also having chest wall pain with cough.  She has also had shortness of breath for which she used her son's inhaler yesterday.   Past Medical History:  Diagnosis Date  . Anemia   . Cholecystitis 11/2016  . Medical history non-contributory     Past Surgical History:  Procedure Laterality Date  . CESAREAN SECTION    . CESAREAN SECTION N/A 11/23/2012   Procedure: CESAREAN SECTION;  Surgeon: Kathreen Cosier, MD;  Location: [facility];  Service: Obstetrics;  Laterality: N/A;  Repeat Cesarean Section Delivery Baby    @      , Apgars   . CESAREAN SECTION N/A 02/15/2016   Procedure: CESAREAN SECTION;  Surgeon: Silverio Lay, MD;  Location: [facility] BIRTHING SU[facility];  Service: Obstetrics;  Laterality: N/A;  . CHOLECYSTECTOMY N/A 12/03/2016   Procedure: LAPAROSCOPIC CHOLECYSTECTOMY;  Surgeon: Rodman Pickle, MD;  Location: [facility] OR;  Service: General;  Laterality: N/A;  . DILATION AND EVACUATION N/A 04/30/2014   Procedure: DILATATION AND EVACUATION;  Surgeon: Kathreen Cosier, MD;  Location: [facility] ORS;  Service: Gynecology;  Laterality: N/A;    Family History  Problem Relation Age of Onset  . Asthma Father   . Asthma Brother     Social History   Tobacco Use  . Smoking status: Current Some Day Smoker    Packs/day: 0.25  . Smokeless tobacco: Never Used  Substance Use Topics  .  Alcohol use: No  . Drug use: No    Prior to Admission medications   Medication Sig Start Date End Date Taking? Authorizing Provider  amoxicillin-clavulanate (AUGMENTIN) 875-125 MG tablet Take 1 tablet by mouth every 12 (twelve) hours. 05/16/17   Mackuen, Courteney Lyn, MD  ibuprofen (ADVIL,MOTRIN) 800 MG tablet Take 1 tablet (800 mg total) by mouth every 8 (eight) hours as needed. 12/03/16   Kinsinger, De Blanch, MD  omeprazole (PRILOSEC) 20 MG capsule Take 1 capsule (20 mg total) by mouth daily. 05/16/17   Mackuen, Courteney Lyn, MD  ondansetron (ZOFRAN ODT) 4 MG disintegrating tablet Take 1 tablet (4 mg total) by mouth every 8 (eight) hours as needed for nausea or vomiting. 05/16/17   Mackuen, Courteney Lyn, MD  ondansetron (ZOFRAN) 4 MG tablet Take 1 tablet (4 mg total) by mouth every 8 (eight) hours as needed for nausea or vomiting. 12/06/16   Meuth, Nehemiah Settle A, PA-C  oxyCODONE (OXY IR/ROXICODONE) 5 MG immediate release tablet Take 1-2 tablets (5-10 mg total) by mouth every 6 (six) hours as needed for moderate pain. 12/04/16   Meuth, Brooke A, PA-C  sucralfate (CARAFATE) 1 GM/10ML suspension Take 10 mLs (1 g total) by mouth 4 (four) times daily -  with meals and at bedtime. 05/16/17   Mackuen, Courteney Lyn, MD    Allergies Latex   REVIEW OF SYSTEMS  Negative except as noted here or in the History  of Present Illness.   PHYSICAL EXAMINATION  Initial Vital Signs Blood pressure (!) 115/95, pulse 81, temperature 100.3 F (37.9 C), temperature source Oral, resp. rate 18, height 5\' 4"  (1.626 m), weight 79.4 kg (175 lb), last menstrual period 02/13/2018, SpO2 96 %.  Examination General: Well-developed, well-nourished female in no acute distress; appearance consistent with age of record HENT: normocephalic; atraumatic; no pharyngeal erythema or exudate; dysphonia Eyes: pupils equal, round and reactive to light; extraocular muscles intact Neck: supple Heart: regular rate and rhythm Lungs:  Decreased air movement bilaterally without frank wheezing; rattly cough Abdomen: soft; nondistended; nontender; bowel sounds present Extremities: No deformity; full range of motion; pulses normal Neurologic: Awake, alert and oriented; motor function intact in all extremities and symmetric; no facial droop Skin: Warm and dry Psychiatric: Flat affect   RESULTS  Summary of this visit's results, reviewed by myself:   EKG Interpretation  Date/Time:    Ventricular Rate:    PR Interval:    QRS Duration:   QT Interval:    QTC Calculation:   R Axis:     Text Interpretation:        Laboratory Studies: No results found for this or any previous visit (from the past 24 hour(s)). Imaging Studies: Dg Chest 2 View  Result Date: 02/20/2018 CLINICAL DATA:  Dry cough for 1 day.  Chest pain EXAM: CHEST - 2 VIEW COMPARISON:  None. FINDINGS: The heart size and mediastinal contours are within normal limits. Both lungs are clear. The visualized skeletal structures are unremarkable. IMPRESSION: No active cardiopulmonary disease. Electronically Signed   By: Deatra RobinsonKevin  Herman M.D.   On: 02/20/2018 23:37    ED COURSE and MDM  Nursing notes and initial vitals signs, including pulse oximetry, reviewed.  Vitals:   02/20/18 2301 02/21/18 0005 02/21/18 0030 02/21/18 0045  BP: (!) 132/92  110/72 111/77  Pulse:   74 77  Resp:   (!) 23 20  Temp:      TempSrc:      SpO2:  96% 95% 95%  Weight:      Height:       2:16 AM Patient eloped stating she did not wish to wait any longer; I was not informed of this until after the fact.  She was given a DuoNeb treatment followed by an albuterol inhaler.    PROCEDURES    ED DIAGNOSES     ICD-10-CM   1. Influenza-like illness R69        Paula LibraMolpus, Latania Bascomb, MD 02/21/18 [phone]

## 2018-02-20 NOTE — ED Triage Notes (Addendum)
Pt from home with c/o generalized body aches and productive cough with sputum and blood. Pt also reports rib pain with cough. Pt states symptoms began yesterday. Pt also has c/o sore throat   Pt took tylenol around 1700.

## 2018-02-21 MED ORDER — ALBUTEROL SULFATE HFA 108 (90 BASE) MCG/ACT IN AERS
2.0000 | INHALATION_SPRAY | RESPIRATORY_TRACT | Status: DC | PRN
  Administered 2018-02-21: 2 via RESPIRATORY_TRACT
  Filled 2018-02-21: qty 6.7

## 2018-02-21 NOTE — ED Notes (Signed)
Patient states she does not want to wait any longer because it is taking to long. Patient left and went home.

## 2019-05-07 ENCOUNTER — Other Ambulatory Visit: Payer: Self-pay

## 2019-05-07 ENCOUNTER — Emergency Department (HOSPITAL_COMMUNITY): Payer: Self-pay

## 2019-05-07 ENCOUNTER — Emergency Department (HOSPITAL_COMMUNITY)
Admission: EM | Admit: 2019-05-07 | Discharge: 2019-05-07 | Disposition: A | Discharge status: Home / Self Care | Payer: Self-pay | Attending: Emergency Medicine | Admitting: Emergency Medicine

## 2019-05-07 ENCOUNTER — Encounter (HOSPITAL_COMMUNITY): Payer: Self-pay | Admitting: Emergency Medicine

## 2019-05-07 DIAGNOSIS — Z91040 Latex allergy status: Secondary | ICD-10-CM | POA: Insufficient documentation

## 2019-05-07 DIAGNOSIS — Z79899 Other long term (current) drug therapy: Secondary | ICD-10-CM | POA: Insufficient documentation

## 2019-05-07 DIAGNOSIS — R112 Nausea with vomiting, unspecified: Secondary | ICD-10-CM | POA: Insufficient documentation

## 2019-05-07 DIAGNOSIS — Z20828 Contact with and (suspected) exposure to other viral communicable diseases: Secondary | ICD-10-CM | POA: Insufficient documentation

## 2019-05-07 DIAGNOSIS — B349 Viral infection, unspecified: Secondary | ICD-10-CM | POA: Insufficient documentation

## 2019-05-07 DIAGNOSIS — R51 Headache: Secondary | ICD-10-CM | POA: Insufficient documentation

## 2019-05-07 DIAGNOSIS — Z720 Tobacco use: Secondary | ICD-10-CM | POA: Insufficient documentation

## 2019-05-07 DIAGNOSIS — R05 Cough: Secondary | ICD-10-CM | POA: Insufficient documentation

## 2019-05-07 LAB — CBC WITH DIFFERENTIAL/PLATELET
Abs Immature Granulocytes: 0.04 10*3/uL (ref 0.00–0.07)
Basophils Absolute: 0 10*3/uL (ref 0.0–0.1)
Basophils Relative: 0 %
Eosinophils Absolute: 0.1 10*3/uL (ref 0.0–0.5)
Eosinophils Relative: 1 %
HCT: 38.6 % (ref 36.0–46.0)
Hemoglobin: 12.8 g/dL (ref 12.0–15.0)
Immature Granulocytes: 0 %
Lymphocytes Relative: 10 %
Lymphs Abs: 1 10*3/uL (ref 0.7–4.0)
MCH: 30.8 pg (ref 26.0–34.0)
MCHC: 33.2 g/dL (ref 30.0–36.0)
MCV: 92.8 fL (ref 80.0–100.0)
Monocytes Absolute: 0.4 10*3/uL (ref 0.1–1.0)
Monocytes Relative: 4 %
Neutro Abs: 8.5 10*3/uL — ABNORMAL HIGH (ref 1.7–7.7)
Neutrophils Relative %: 85 %
Platelets: 287 10*3/uL (ref 150–400)
RBC: 4.16 MIL/uL (ref 3.87–5.11)
RDW: 13.4 % (ref 11.5–15.5)
WBC: 10.1 10*3/uL (ref 4.0–10.5)
nRBC: 0 % (ref 0.0–0.2)

## 2019-05-07 LAB — COMPREHENSIVE METABOLIC PANEL
ALT: 11 U/L (ref 0–44)
AST: 15 U/L (ref 15–41)
Albumin: 3.9 g/dL (ref 3.5–5.0)
Alkaline Phosphatase: 68 U/L (ref 38–126)
Anion gap: 11 (ref 5–15)
BUN: 7 mg/dL (ref 6–20)
CO2: 21 mmol/L — ABNORMAL LOW (ref 22–32)
Calcium: 9 mg/dL (ref 8.9–10.3)
Chloride: 105 mmol/L (ref 98–111)
Creatinine, Ser: 0.63 mg/dL (ref 0.44–1.00)
GFR calc Af Amer: >60 mL/min (ref >60)
GFR calc non Af Amer: >60 mL/min (ref >60)
Glucose, Bld: 89 mg/dL (ref 70–99)
Potassium: 3.9 mmol/L (ref 3.5–5.1)
Sodium: 137 mmol/L (ref 135–145)
Total Bilirubin: 1 mg/dL (ref 0.3–1.2)
Total Protein: 6.9 g/dL (ref 6.5–8.1)

## 2019-05-07 LAB — I-STAT BETA HCG BLOOD, ED (MC, WL, AP ONLY): I-stat hCG, quantitative: <5 m[IU]/mL (ref <5)

## 2019-05-07 MED ORDER — ONDANSETRON HCL 4 MG PO TABS: 4.0000 mg | ORAL_TABLET | Freq: Three times a day (TID) | ORAL | 0 refills | Status: AC | PRN

## 2019-05-07 MED ORDER — ONDANSETRON 4 MG PO TBDP
4.0000 mg | ORAL_TABLET | Freq: Once | ORAL | Status: AC
  Administered 2019-05-07: 18:00:00 4 mg via ORAL
  Filled 2019-05-07: qty 1

## 2019-05-07 MED ORDER — ACETAMINOPHEN 500 MG PO TABS
1000.0000 mg | ORAL_TABLET | Freq: Once | ORAL | Status: AC
  Administered 2019-05-07: 18:00:00 1000 mg via ORAL
  Filled 2019-05-07: qty 2

## 2019-05-07 MED ORDER — IBUPROFEN 400 MG PO TABS
600.0000 mg | ORAL_TABLET | Freq: Once | ORAL | Status: AC
  Administered 2019-05-07: 20:00:00 600 mg via ORAL
  Filled 2019-05-07: qty 1

## 2019-05-07 NOTE — ED Notes (Addendum)
Pt tearful, complains of generalized body aches, pain, and chills. Pt states she had a temp of 101 this morning, she took advil at work and rechecked her temp and it was 99. Endorses chills, N/V at work and she came to the ED. Pt states she has also had a toothache on L side but she's had a history of it.

## 2019-05-07 NOTE — ED Triage Notes (Signed)
Patient reports generalized body aches, headache, runny nose, dry cough that began today. Afebrile in triage. States she traveled to [place] 3 weeks ago.

## 2019-05-07 NOTE — Discharge Instructions (Addendum)
You may alternate taking Tylenol and Ibuprofen as needed for pain control. You may take 400-600 mg of ibuprofen every 6 hours and [phone] mg of Tylenol every 6 hours. Do not exceed 4000 mg of Tylenol daily as this can lead to liver damage. Also, make sure to take Ibuprofen with meals as it can cause an upset stomach. Do not take other NSAIDs while taking Ibuprofen such as (Aleve, Naprosyn, Aspirin, Celebrex, etc) and do not take more than the prescribed dose as this can lead to ulcers and bleeding in your GI tract. You may use warm and cold compresses to help with your symptoms.   You should be isolated for at least 7 days since the onset of your symptoms AND >72 hours after symptoms resolution (absence of fever without the use of fever reducing medication and improvement in respiratory symptoms), whichever is longer  Please follow up with your primary doctor within the next 7-10 days for re-evaluation and further treatment of your symptoms.   Please return to the ER sooner if you have any new or worsening symptoms.

## 2019-05-07 NOTE — ED Provider Notes (Signed)
[facility] Provider Note   CSN: [id number] Arrival date & time: 05/07/19  1635     History   Chief Complaint Chief Complaint  Patient presents with  . Generalized Body Aches    HPI Lauren Pineda is a 33 y.o. female.     HPI   Pt is a 33 y/o female who presents to the ED today for eval of generalized body aches, headaches, chills, ear pain. She also c/o mild rhinorrhea and a cough that started today. She also reports nausea and vomiting. Denies diarrhea. Reports diffuse abd pain that is consistent with the rest of her generalized body aches. Denies SOB, CP, dysuria. Denies abnormal vaginal bleeding or discharge. Denies sore throat. She took advil and it has not improved sxs.  She states that her boyfriend has been sick with similar sxs for the last several days. Denies any known COVID contacts. States she traveled to [place] 3 weeks ago. She states she works at [employer].   Past Medical History:  Diagnosis Date  . Anemia   . Cholecystitis 11/2016  . Medical history non-contributory     Patient Active Problem List   Diagnosis Date Noted  . Cholecystitis 12/04/2016  . Cholecystitis, chronic 12/03/2016  . Incisional pain 02/20/2016  . Cesarean delivery delivered 02/15/2016  . Back pain affecting pregnancy 01/29/2016  . Sciatica of left side 01/07/2016  . Previous cesarean section x 3 12/19/2015  . H/O premature delivery x 2--one spontaneous, one induced 12/19/2015  . 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 History of prior pregnancy with IUGR newborn 12/19/2015  . Anemia 12/19/2015    Past Surgical History:  Procedure Laterality Date  . CESAREAN SECTION    . CESAREAN SECTION N/A 11/23/2012   Procedure: CESAREAN SECTION;  Surgeon: Kathreen CosierBernard A Marshall, MD;  Location: [facility];  Service: Obstetrics;  Laterality: N/A;  Repeat Cesarean Section Delivery Baby    @      , Apgars   . CESAREAN SECTION  N/A 02/15/2016   Procedure: CESAREAN SECTION;  Surgeon: Silverio LaySandra Rivard, MD;  [facility];  Service: Obstetrics;  Laterality: N/A;  . CHOLECYSTECTOMY N/A 12/03/2016   Procedure: LAPAROSCOPIC CHOLECYSTECTOMY;  Surgeon: Rodman PickleLuke Aaron Kinsinger, MD;  [facility];  Service: General;  Laterality: N/A;  . DILATION AND EVACUATION N/A 04/30/2014   Procedure: DILATATION AND EVACUATION;  Surgeon: Kathreen CosierBernard A Marshall, MD;  Location: [facility];  Service: Gynecology;  Laterality: N/A;     OB History    Gravida  8   Para  5   Term  3   Preterm  2   AB  3   Living  5     SAB  3   TAB      Ectopic      Multiple  0   Live Births  5            Home Medications    Prior to Admission medications   Medication Sig Start Date End Date Taking? Authorizing Provider  amoxicillin-clavulanate (AUGMENTIN) 875-125 MG tablet Take 1 tablet by mouth every 12 (twelve) hours. 05/16/17   Mackuen, Courteney Lyn, MD  ibuprofen (ADVIL,MOTRIN) 800 MG tablet Take 1 tablet (800 mg total) by mouth every 8 (eight) hours as needed. 12/03/16   Kinsinger, De BlanchLuke Aaron, MD  omeprazole (PRILOSEC) 20 MG capsule Take 1 capsule (20 mg total) by mouth daily. 05/16/17   Mackuen, Cindee Saltourteney Lyn, MD  ondansetron (ZOFRAN ODT) 4 MG disintegrating tablet Take 1 tablet (4 mg total) by mouth every 8 (eight) hours as needed for nausea or vomiting. 05/16/17   Mackuen, Courteney Lyn, MD  ondansetron (ZOFRAN) 4 MG tablet Take 1 tablet (4 mg total) by mouth every 8 (eight) hours as needed for nausea or vomiting. 05/07/19   Trea Latner S, PA-C  oxyCODONE (OXY IR/ROXICODONE) 5 MG immediate release tablet Take 1-2 tablets (5-10 mg total) by mouth every 6 (six) hours as needed for moderate pain. 12/04/16   Meuth, Brooke A, PA-C  sucralfate (CARAFATE) 1 GM/10ML suspension Take 10 mLs (1 g total) by mouth 4 (four) times daily -  with meals and at bedtime. 05/16/17   Mackuen, Cindee Saltourteney Lyn, MD    Family History Family History   Problem Relation Age of Onset  . Asthma Father   . Asthma Brother     Social History Social History   Tobacco Use  . Smoking status: Current Some Day Smoker    Packs/day: 0.25  . Smokeless tobacco: Never Used  Substance Use Topics  . Alcohol use: No  . Drug use: No     Allergies   Latex   Review of Systems Review of Systems  Constitutional: Positive for chills, diaphoresis and fever.  HENT: Positive for congestion and rhinorrhea. Negative for ear pain and sore throat.   Eyes: Negative for pain and visual disturbance.  Respiratory: Positive for cough. Negative for shortness of breath.   Cardiovascular: Negative for chest pain and palpitations.  Gastrointestinal: Positive for nausea and vomiting. Negative for abdominal pain, constipation and diarrhea.  Genitourinary: Negative for dysuria and hematuria.  Musculoskeletal: Positive for myalgias. Negative for back pain.  Skin: Negative for color change and rash.  Neurological: Positive for headaches. Negative for dizziness, weakness, light-headedness and numbness.  All other systems reviewed and are negative.    Physical Exam Updated Vital Signs BP 117/80   Pulse 81   Temp 98.3 F (36.8 C) (Oral)   Resp 17   LMP 04/30/2019 (Approximate)   SpO2 99%   Physical Exam Vitals signs and nursing note reviewed.  Constitutional:      Appearance: She is well-developed. She is not ill-appearing or toxic-appearing.  HENT:     Head: Normocephalic and atraumatic.     Mouth/Throat:     Mouth: Mucous membranes are moist.  Eyes:     Extraocular Movements: Extraocular movements intact.     Conjunctiva/sclera: Conjunctivae normal.     Pupils: Pupils are equal, round, and reactive to light.  Neck:     Musculoskeletal: Neck supple.  Cardiovascular:     Rate and Rhythm: Normal rate and regular rhythm.     Pulses: Normal pulses.     Heart sounds: Normal heart sounds. No murmur.  Pulmonary:     Effort: Pulmonary effort is normal.  No respiratory distress.     Breath sounds: Normal breath sounds. No wheezing, rhonchi or rales.  Abdominal:     General: Bowel sounds are normal. There is no distension.     Palpations: Abdomen is soft.     Tenderness: There is no abdominal tenderness. There is no guarding or rebound.  Skin:    General: Skin is warm and dry.  Neurological:     Mental Status: She is alert.     Comments: Moving all extremities with normal coordination  Psychiatric:     Comments: Anxious, tearful      ED Treatments / Results  Labs (all  labs ordered are listed, but only abnormal results are displayed) Labs Reviewed  CBC WITH DIFFERENTIAL/PLATELET - Abnormal; Notable for the following components:      Result Value   Neutro Abs 8.5 (*)    All other components within normal limits  COMPREHENSIVE METABOLIC PANEL - Abnormal; Notable for the following components:   CO2 21 (*)    All other components within normal limits  SARS CORONAVIRUS 2  I-STAT BETA HCG BLOOD, ED ([facility], [facility], [facility] ONLY)    EKG None  Radiology Dg Chest Portable 1 View  Result Date: 05/07/2019 CLINICAL DATA:  Cough EXAM: PORTABLE CHEST 1 VIEW COMPARISON:  02/20/2018 FINDINGS: The heart size and mediastinal contours are within normal limits. Both lungs are clear. The visualized skeletal structures are unremarkable. IMPRESSION: No active disease. Electronically Signed   By: Donavan Foil M.D.   On: 05/07/2019 19:49    Procedures Procedures (including critical care time)  Medications Ordered in ED Medications  acetaminophen (TYLENOL) tablet 1,000 mg (1,000 mg Oral Given 05/07/19 1756)  ondansetron (ZOFRAN-ODT) disintegrating tablet 4 mg (4 mg Oral Given 05/07/19 1828)  ibuprofen (ADVIL) tablet 600 mg (600 mg Oral Given 05/07/19 2012)     Initial Impression / Assessment and Plan / ED Course  I have reviewed the triage vital signs and the nursing notes.  Pertinent labs & imaging results that were available during my care of the  patient were reviewed by me and considered in my medical decision making (see chart for details).      Final Clinical Impressions(s) / ED Diagnoses   Final diagnoses:  Viral syndrome   Patient with fevers, chills, body aches, headaches, cough, rhinorrhea starting this morning.  Was in [place] a few weeks ago.  Also has significant other with similar symptoms.  Has had some episodes of vomiting but no abdominal pain.  On exam she is nontoxic, nonseptic appearing.  Vital signs are reassuring.  Abdomen is soft and nontender.  Lungs are clear to auscultation bilaterally.  Will obtain labs to check electrolytes, will obtain chest x-ray to rule out pneumonia.  Will obtain COVID swab.  Labs are reassuring.  Normal electrolytes.  Normal kidney and liver function.  Beta-hCG is negative.  Chest x-ray does not show evidence of pneumonia.  Patient has been able to tolerate p.o. and states she feels much improved after receiving Tylenol and Motrin in the ED.  Discussed the plan for discharge.  Advised to self quarantine given that she has been tested for the coronavirus.  Advised return the ER for new or worsening symptoms.  She voiced understand the plan and reasons to return.  All questions answered.  Patient stable discharge.   Dorothey Baseman was evaluated in Emergency Department on 05/07/2019 for the symptoms described in the history of present illness. She was evaluated in the context of the global COVID-19 pandemic, which necessitated consideration that the patient might be at risk for infection with the SARS-CoV-2 virus that causes COVID-19. Institutional protocols and algorithms that pertain to the evaluation of patients at risk for COVID-19 are in a state of rapid change based on information released by regulatory bodies including the CDC and federal and state organizations. These policies and algorithms were followed during the patient's care in the ED.   ED Discharge Orders         Ordered     ondansetron (ZOFRAN) 4 MG tablet  Every 8 hours PRN     05/07/19 2101  Rayne DuCouture, Jihan Rudy S, PA-C 05/07/19 2102    Gwyneth SproutPlunkett, Whitney, MD 05/09/19 2041

## 2019-05-08 LAB — SARS CORONAVIRUS 2 (TAT 6-24 HRS): SARS Coronavirus 2: NEGATIVE

## 2019-08-08 ENCOUNTER — Emergency Department (HOSPITAL_COMMUNITY)
Admission: EM | Admit: 2019-08-08 | Discharge: 2019-08-08 | Disposition: A | Discharge status: Home / Self Care | Payer: Self-pay | Attending: Emergency Medicine | Admitting: Emergency Medicine

## 2019-08-08 ENCOUNTER — Other Ambulatory Visit: Payer: Self-pay

## 2019-08-08 ENCOUNTER — Encounter (HOSPITAL_COMMUNITY): Payer: Self-pay | Admitting: Emergency Medicine

## 2019-08-08 ENCOUNTER — Emergency Department (HOSPITAL_COMMUNITY): Payer: Self-pay

## 2019-08-08 DIAGNOSIS — R0789 Other chest pain: Secondary | ICD-10-CM | POA: Insufficient documentation

## 2019-08-08 DIAGNOSIS — F17210 Nicotine dependence, cigarettes, uncomplicated: Secondary | ICD-10-CM | POA: Insufficient documentation

## 2019-08-08 DIAGNOSIS — Z79899 Other long term (current) drug therapy: Secondary | ICD-10-CM | POA: Insufficient documentation

## 2019-08-08 DIAGNOSIS — Z91040 Latex allergy status: Secondary | ICD-10-CM | POA: Insufficient documentation

## 2019-08-08 HISTORY — DX: Anxiety disorder, unspecified: F41.9

## 2019-08-08 LAB — BASIC METABOLIC PANEL
Anion gap: 7 (ref 5–15)
BUN: 6 mg/dL (ref 6–20)
CO2: 25 mmol/L (ref 22–32)
Calcium: 9 mg/dL (ref 8.9–10.3)
Chloride: 107 mmol/L (ref 98–111)
Creatinine, Ser: 0.7 mg/dL (ref 0.44–1.00)
GFR calc Af Amer: >60 mL/min (ref >60)
GFR calc non Af Amer: >60 mL/min (ref >60)
Glucose, Bld: 95 mg/dL (ref 70–99)
Potassium: 3.9 mmol/L (ref 3.5–5.1)
Sodium: 139 mmol/L (ref 135–145)

## 2019-08-08 LAB — CBC
HCT: 40.3 % (ref 36.0–46.0)
Hemoglobin: 12.7 g/dL (ref 12.0–15.0)
MCH: 30.5 pg (ref 26.0–34.0)
MCHC: 31.5 g/dL (ref 30.0–36.0)
MCV: 96.9 fL (ref 80.0–100.0)
Platelets: 321 10*3/uL (ref 150–400)
RBC: 4.16 MIL/uL (ref 3.87–5.11)
RDW: 13.8 % (ref 11.5–15.5)
WBC: 7 10*3/uL (ref 4.0–10.5)
nRBC: 0 % (ref 0.0–0.2)

## 2019-08-08 LAB — I-STAT BETA HCG BLOOD, ED (MC, WL, AP ONLY): I-stat hCG, quantitative: <5 m[IU]/mL (ref <5)

## 2019-08-08 LAB — TROPONIN I (HIGH SENSITIVITY)
Troponin I (High Sensitivity): <2 ng/L (ref <18)
Troponin I (High Sensitivity): <2 ng/L (ref <18)

## 2019-08-08 MED ORDER — SODIUM CHLORIDE 0.9% FLUSH: 3.0000 mL | Freq: Once | INTRAVENOUS | Status: DC

## 2019-08-08 MED ORDER — HYDROXYZINE HCL 25 MG PO TABS: 25.0000 mg | ORAL_TABLET | Freq: Four times a day (QID) | ORAL | 0 refills | Status: AC

## 2019-08-08 NOTE — Consult Note (Signed)
Cardiology Consultation:   Patient ID: Lauren Pineda MRN: [id number]; DOB: 1986/08/24  Admit date: 08/08/2019 Date of Consult: 08/08/2019  Primary Care Provider: Patient, No Pcp Per Primary Cardiologist: New Primary Electrophysiologist:  None    Patient Profile:   Lauren Pineda is a 33 y.o. female with a hx of tobacco use who is being seen today for the evaluation of chest pain at the request of Dr Rush Landmark.  History of Present Illness:   Lauren Pineda 33 yo female no prior cardiac history. Has history of tobacco use and anxiety. Presents with chest pains. Symptoms started 3 days ago. Pressure sometimes sharp pain mid to left chest, can occur at rest or with activity. Can sometime feel numb on her left side when it happens. Not positoinal. Lasts about to an hour. NO recent SOB or DOE. Can sometimes be worst with eating spicy foods. In general can have a burning like feeling in her throat sometimes.    K 3.9 Cr 0.7 BUN 6 WBC 7 Hgb 12.7 Plt 321 Serum pregnancy negative Trop <2 EKG SR, diffuse TWIs CXR no acute process Heart Pathway Score:     Past Medical History:  Diagnosis Date  . Anemia   . Anxiety   . Cholecystitis 11/2016  . Medical history non-contributory     Past Surgical History:  Procedure Laterality Date  . CESAREAN SECTION    . CESAREAN SECTION N/A 11/23/2012   Procedure: CESAREAN SECTION;  Surgeon: Kathreen Cosier, MD;  Location: [facility];  Service: Obstetrics;  Laterality: N/A;  Repeat Cesarean Section Delivery Baby    @      , Apgars   . CESAREAN SECTION N/A 02/15/2016   Procedure: CESAREAN SECTION;  Surgeon: Silverio Lay, MD;  Location: [facility] BIRTHING SUIT[facility];  Service: Obstetrics;  Laterality: N/A;  . CHOLECYSTECTOMY N/A 12/03/2016   Procedure: LAPAROSCOPIC CHOLECYSTECTOMY;  Surgeon: Rodman Pickle, MD;  Location[facility] OR;  Service: General;  Laterality: N/A;  . DILATION AND EVACUATION N/A 04/30/2014   Procedure: DILATATION AND EVACUATION;  Surgeon:  Kathreen Cosier, MD;  Location: [facility];  Service: Gynecology;  Laterality: N/A;      Inpatient Medications: Scheduled Meds: . sodium chloride flush  3 mL Intravenous Once   Continuous Infusions:  PRN Meds:   Allergies:    Allergies  Allergen Reactions  . Latex Itching    Social History:   Social History   Socioeconomic History  . Marital status: Single    Spouse name: Not on file  . Number of children: Not on file  . Years of education: Not on file  . Highest education level: Not on file  Occupational History  . Not on file  Social Needs  . Financial resource strain: Not on file  . Food insecurity    Worry: Not on file    Inability: Not on file  . Transportation needs    Medical: Not on file    Non-medical: Not on file  Tobacco Use  . Smoking status: Current Some Day Smoker    Packs/day: 0.25  . Smokeless tobacco: Never Used  Substance and Sexual Activity  . Alcohol use: No  . Drug use: No  . Sexual activity: Yes    Birth control/protection: None  Lifestyle  . Physical activity    Days per week: Not on file    Minutes per session: Not on file  . Stress: Not on file  Relationships  . Social connections    Talks  on phone: Not on file    Gets together: Not on file    Attends religious service: Not on file    Active member of club or organization: Not on file    Attends meetings of clubs or organizations: Not on file    Relationship status: Not on file  . Intimate partner violence    Fear of current or ex partner: Not on file    Emotionally abused: Not on file    Physically abused: Not on file    Forced sexual activity: Not on file  Other Topics Concern  . Not on file  Social History Narrative  . Not on file    Family History:    Family History  Problem Relation Age of Onset  . Asthma Father   . Asthma Brother      ROS:  Please see the history of present illness.  All other ROS reviewed and negative.     Physical Exam/Data:   Vitals:    08/08/19 1214  BP: 120/89  Pulse: 78  Resp: 18  Temp: 98.8 F (37.1 C)  TempSrc: Oral  SpO2: 100%   No intake or output data in the 24 hours ending 08/08/19 1417 Last 3 Weights 02/20/2018 05/16/2017 11/30/2016  Weight (lbs) 175 lb 185 lb 184 lb  Weight (kg) 79.379 kg 83.915 kg 83.462 kg     There is no height or weight on file to calculate BMI.  General:  Well nourished, well developed, in no acute distress HEENT: normal Lymph: no adenopathy Neck: no JVD Endocrine:  No thryomegaly Vascular: No carotid bruits; FA pulses 2+ bilaterally without bruits  Cardiac:  normal S1, S2; RRR; no murmur  Lungs:  clear to auscultation bilaterally, no wheezing, rhonchi or rales  Abd: soft, nontender, no hepatomegaly  Ext: no edema Musculoskeletal:  No deformities, BUE and BLE strength normal and equal Skin: warm and dry  Neuro:  CNs 2-12 intact, no focal abnormalities noted Psych:  Normal affect    Laboratory Data:  High Sensitivity Troponin:   Recent Labs  Lab 08/08/19 1230  TROPONINIHS <2     Chemistry Recent Labs  Lab 08/08/19 1230  NA 139  K 3.9  CL 107  CO2 25  GLUCOSE 95  BUN 6  CREATININE 0.70  CALCIUM 9.0  GFRNONAA >60  GFRAA >60  ANIONGAP 7    No results for input(s): PROT, ALBUMIN, AST, ALT, ALKPHOS, BILITOT in the last 168 hours. Hematology Recent Labs  Lab 08/08/19 1230  WBC 7.0  RBC 4.16  HGB 12.7  HCT 40.3  MCV 96.9  MCH 30.5  MCHC 31.5  RDW 13.8  PLT 321   BNPNo results for input(s): BNP, PROBNP in the last 168 hours.  DDimer No results for input(s): DDIMER in the last 168 hours.   Radiology/Studies:  Dg Chest 2 View  Result Date: 08/08/2019 CLINICAL DATA:  Intermittent left-sided chest pain. EXAM: CHEST - 2 VIEW COMPARISON:  05/07/2019 FINDINGS: The heart size and mediastinal contours are within normal limits. Both lungs are clear. The visualized skeletal structures are unremarkable. IMPRESSION: No active cardiopulmonary disease.  Electronically Signed   By: Misty Stanley M.D.   On: 08/08/2019 12:45    Assessment and Plan:   1. Chest pain - young patient without significant CAD risk factors, fairly mild history of tobacco abuse - pain is for the most part atypical. Perhaps some GI component as it can sometime be worst with spicy foods but not always -  nonspecific EKG changes, particularly in this clinical context - f/u secondy trop, if negative then would plan for outpatient cardiology - would emperically try PPI for possible GI etiology. Other possibility could be stress related, she had similar symptoms a few months ago and was told primarily related to anxiety    For questions or updates, please contact [facility] Please consult [email or web address] for contact info under     Signed, Dina RichBranch, Jaianna Nicoll, MD  08/08/2019 2:17 PM

## 2019-08-08 NOTE — ED Triage Notes (Signed)
C/o intermittent L sided chest pain x 3 days with dizziness.  Denies SOB, nausea, and vomiting.  Reports history of same type of pain before that was diagnosed as anxiety but she doesn't think it is anxiety.

## 2019-08-08 NOTE — Discharge Instructions (Addendum)
I am sending you home with an anxiety medication. Take as prescribed. I have also provided you with the cardiologist number. Call on Monday to schedule an appointment for further evaluation. You can also try over the counter reflux medication if you notice your pain is related to spicy foods. You can take over the counter ibuprofen as needed for pain. Return to the ER for new or worsening symptoms.

## 2019-08-08 NOTE — ED Provider Notes (Signed)
[facility] EMERGENCY DEPARTMENT Provider Note   CSN: [id number] Arrival date & time: 08/08/19  1206     History   Chief Complaint Chief Complaint  Patient presents with  . Chest Pain    HPI Lauren Pineda is a 33 y.o. female a past medical history significant for anemia and anxiety who presents to the ED for evaluation of intermittent left-sided chest pain for the past 3 days. Patient notes chest pain occurs at rest and with exertion and typically lasts about 10-15 minutes. She describes the pain as chest pressure with occasional sharp pains. Chest pain is associated with lightheadedness. Patient admits to numbness and tingling down left arm that occurs sometimes with the chest pain. Patient notes she has had chest pain before which was attributed to an anxiety attack. Patient smokes roughly 5 cigarettes nightly. No known family history of CAD; however, patient recently found out that her father that raised her is not her biological father. Patient denies recent illness. Patient denies shortness of breath, palpitations, lower extremity edema, fever, chills, and abdominal pain.    Past Medical History:  Diagnosis Date  . Anemia   . Anxiety   . Cholecystitis 11/2016  . Medical history non-contributory     Patient Active Problem List   Diagnosis Date Noted  . Cholecystitis 12/04/2016  . Cholecystitis, chronic 12/03/2016  . Incisional pain 02/20/2016  . Cesarean delivery delivered 02/15/2016  . Back pain affecting pregnancy 01/29/2016  . Sciatica of left side 01/07/2016  . Previous cesarean section x 3 12/19/2015  . H/O premature delivery x 2--one spontaneous, one induced 12/19/2015  . 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 History of prior pregnancy with IUGR newborn 12/19/2015  . Anemia 12/19/2015    Past Surgical History:  Procedure Laterality Date  . CESAREAN SECTION    . CESAREAN SECTION N/A 11/23/2012   Procedure: CESAREAN SECTION;  Surgeon: Kathreen Cosier, MD;  Location: [facility] ORS;  Service: Obstetrics;  Laterality: N/A;  Repeat Cesarean Section Delivery Baby    @      , Apgars   . CESAREAN SECTION N/A 02/15/2016   Procedure: CESAREAN SECTION;  Surgeon: Silverio Lay, MD;  Location: [facility] BIRTHING SUITES;  Service: Obstetrics;  Laterality: N/A;  . CHOLECYSTECTOMY N/A 12/03/2016   Procedure: LAPAROSCOPIC CHOLECYSTECTOMY;  Surgeon: Rodman Pickle, MD;  Location: [facility] OR;  Service: General;  Laterality: N/A;  . DILATION AND EVACUATION N/A 04/30/2014   Procedure: DILATATION AND EVACUATION;  Surgeon: Kathreen Cosier, MD;  Location: [facility] ORS;  Service: Gynecology;  Laterality: N/A;     OB History    Gravida  8   Para  5   Term  3   Preterm  2   AB  3   Living  5     SAB  3   TAB      Ectopic      Multiple  0   Live Births  5            Home Medications    Prior to Admission medications   Medication Sig Start Date End Date Taking? Authorizing Provider  amoxicillin-clavulanate (AUGMENTIN) 875-125 MG tablet Take 1 tablet by mouth every 12 (twelve) hours. 05/16/17   Mackuen, Courteney Lyn, MD  hydrOXYzine (ATARAX/VISTARIL) 25 MG tablet Take 1 tablet (25 mg total) by mouth every 6 (six) hours. 08/08/19   Cheek, Vesta Mixer, PA-C  ibuprofen (ADVIL,MOTRIN) 800 MG tablet Take  1 tablet (800 mg total) by mouth every 8 (eight) hours as needed. 12/03/16   Kinsinger, De BlanchLuke Aaron, MD  omeprazole (PRILOSEC) 20 MG capsule Take 1 capsule (20 mg total) by mouth daily. 05/16/17   Mackuen, Courteney Lyn, MD  ondansetron (ZOFRAN ODT) 4 MG disintegrating tablet Take 1 tablet (4 mg total) by mouth every 8 (eight) hours as needed for nausea or vomiting. 05/16/17   Mackuen, Courteney Lyn, MD  ondansetron (ZOFRAN) 4 MG tablet Take 1 tablet (4 mg total) by mouth every 8 (eight) hours as needed for nausea or vomiting. 05/07/19   Couture, Cortni S, PA-C  oxyCODONE (OXY IR/ROXICODONE) 5 MG immediate release  tablet Take 1-2 tablets (5-10 mg total) by mouth every 6 (six) hours as needed for moderate pain. 12/04/16   Meuth, Brooke A, PA-C  sucralfate (CARAFATE) 1 GM/10ML suspension Take 10 mLs (1 g total) by mouth 4 (four) times daily -  with meals and at bedtime. 05/16/17   Mackuen, Cindee Saltourteney Lyn, MD    Family History Family History  Problem Relation Age of Onset  . Asthma Father   . Asthma Brother     Social History Social History   Tobacco Use  . Smoking status: Current Some Day Smoker    Packs/day: 0.25  . Smokeless tobacco: Never Used  Substance Use Topics  . Alcohol use: No  . Drug use: No     Allergies   Latex   Review of Systems Review of Systems  Constitutional: Negative for chills and fever.  Respiratory: Negative for shortness of breath.   Cardiovascular: Positive for chest pain. Negative for palpitations and leg swelling.  Gastrointestinal: Negative for abdominal pain, diarrhea, nausea and vomiting.  Neurological: Positive for light-headedness and numbness.     Physical Exam Updated Vital Signs BP (!) 128/96 (BP Location: Right Arm)   Pulse 68   Temp 98.8 F (37.1 C) (Oral)   Resp 19   LMP 08/06/2019   SpO2 100%   Physical Exam Vitals signs and nursing note reviewed.  Constitutional:      General: She is not in acute distress.    Appearance: She is not ill-appearing.     Comments: Tearful on exam  HENT:     Head: Normocephalic.  Eyes:     Conjunctiva/sclera: Conjunctivae normal.  Neck:     Musculoskeletal: Neck supple.  Cardiovascular:     Rate and Rhythm: Normal rate and regular rhythm.     Pulses: Normal pulses.     Heart sounds: Normal heart sounds. No murmur. No friction rub. No gallop.   Pulmonary:     Effort: Pulmonary effort is normal.     Breath sounds: Normal breath sounds.  Abdominal:     General: Abdomen is flat. There is no distension.     Palpations: Abdomen is soft.     Tenderness: There is no abdominal tenderness. There is no  guarding or rebound.  Musculoskeletal:     Right lower leg: No edema.     Left lower leg: No edema.     Comments: Able to move all 4 extremities without difficulty  Skin:    General: Skin is warm and dry.     Capillary Refill: Capillary refill takes less than 2 seconds.  Neurological:     General: No focal deficit present.     Mental Status: She is alert.      ED Treatments / Results  Labs (all labs ordered are listed, but only abnormal results are  displayed) Labs Reviewed  BASIC METABOLIC PANEL  CBC  I-STAT BETA HCG BLOOD, ED ([facility], [facility], [facility] ONLY)  TROPONIN I (HIGH SENSITIVITY)  TROPONIN I (HIGH SENSITIVITY)    EKG EKG Interpretation  Date/Time:  Saturday August 08 2019 12:14:45 EST Ventricular Rate:  76 PR Interval:  124 QRS Duration: 100 QT Interval:  366 QTC Calculation: 411 R Axis:   28 Text Interpretation: Normal sinus rhythm with sinus arrhythmia Incomplete right bundle branch block T wave abnormality, consider inferior ischemia T wave abnormality, consider anterior ischemia Abnormal ECG No prior ECG for comparison. T wave inversions diffusely. NO STEMI Confirmed by Antony Blackbird [phone]) on 08/08/2019 1:44:39 PM   Radiology Dg Chest 2 View  Result Date: 08/08/2019 CLINICAL DATA:  Intermittent left-sided chest pain. EXAM: CHEST - 2 VIEW COMPARISON:  05/07/2019 FINDINGS: The heart size and mediastinal contours are within normal limits. Both lungs are clear. The visualized skeletal structures are unremarkable. IMPRESSION: No active cardiopulmonary disease. Electronically Signed   By: Misty Stanley M.D.   On: 08/08/2019 12:45    Procedures Procedures (including critical care time)  Medications Ordered in ED Medications - No data to display   Initial Impression / Assessment and Plan / ED Course  I have reviewed the triage vital signs and the nursing notes.  Pertinent labs & imaging results that were available during my care of the patient were reviewed by me  and considered in my medical decision making (see chart for details).  Clinical Course as of Aug 07 2136  Sat Aug 08, 2019  1416 Spoke to Dr. Harl Bowie about patient's case who recommends delta troponin. Dr. Harl Bowie will evaluate patient at bedside.    [CC]    Clinical Course User Index [CC] Cheek, Comer Locket, PA-C      33 year old female presents to the ED for evaluation of chest pain. Patient is afebrile, not tachycardic or hypoxic. Patient in no acute distress and non-ill appearing. Neurovascularly intact. Normal heart sounds. Clear to auscultation bilaterally. No lower extremity edema. NO JVD. Trachea midline. Heart pathway score of 3. PERC negative. Will obtain routine labs, troponin, CXR, and EKG.  Labs, CXR, and EKG personally reviewed. CMP and CBC unremarkable. No leukocytosis. Normal renal function. No electrolyte derangements. CXR negative for signs of infection. Normal sized heart and mediastinum. EKG with normal sinus rhythm, but diffuse t-wave inversions. Given EKG changes, will consult cardiology. Serial troponin negative, low suspicion of ACS at this time.   Dr. Harl Bowie evaluated patient and recommends outpatient cardiology evaluation if delta troponins are negative. He suspects there could be a GI or anxiety component. Will discharge patient home with some vistaril to help treat anxiety. Patient advised to use OTC reflux medication as needed for reflux. Cardiology number given to patient at discharge. Patient advised to call Monday to schedule an appointment. Strict ED precautions discussed with patient. Patient states understanding and agrees to plan. Patient discharged home in no acute distress and vitals within normal limits.   Discussed case with Dr. Sherry Ruffing who agrees with assessment and plan.    Final Clinical Impressions(s) / ED Diagnoses   Final diagnoses:  Atypical chest pain    ED Discharge Orders         Ordered    hydrOXYzine (ATARAX/VISTARIL) 25 MG tablet  Every  6 hours     08/08/19 1629           Romie Levee 08/08/19 2137    Tegeler, Gwenyth Allegra, MD 08/09/19  0712
